# Patient Record
Sex: Female | Born: 1970
Health system: Southern US, Community
[De-identification: ages and names within clinical notes are randomized; demographics above are authoritative.]

## PROBLEM LIST (undated history)

## (undated) DIAGNOSIS — I82409 Acute embolism and thrombosis of unspecified deep veins of unspecified lower extremity: Secondary | ICD-10-CM

## (undated) DIAGNOSIS — F329 Major depressive disorder, single episode, unspecified: Secondary | ICD-10-CM

## (undated) DIAGNOSIS — F32A Depression, unspecified: Secondary | ICD-10-CM

## (undated) DIAGNOSIS — I2510 Atherosclerotic heart disease of native coronary artery without angina pectoris: Secondary | ICD-10-CM

## (undated) DIAGNOSIS — I509 Heart failure, unspecified: Secondary | ICD-10-CM

## (undated) DIAGNOSIS — J189 Pneumonia, unspecified organism: Secondary | ICD-10-CM

## (undated) DIAGNOSIS — I2119 ST elevation (STEMI) myocardial infarction involving other coronary artery of inferior wall: Secondary | ICD-10-CM

## (undated) DIAGNOSIS — K219 Gastro-esophageal reflux disease without esophagitis: Secondary | ICD-10-CM

## (undated) DIAGNOSIS — I214 Non-ST elevation (NSTEMI) myocardial infarction: Secondary | ICD-10-CM

## (undated) DIAGNOSIS — J45909 Unspecified asthma, uncomplicated: Secondary | ICD-10-CM

## (undated) DIAGNOSIS — G8929 Other chronic pain: Secondary | ICD-10-CM

## (undated) DIAGNOSIS — M1990 Unspecified osteoarthritis, unspecified site: Secondary | ICD-10-CM

## (undated) DIAGNOSIS — J449 Chronic obstructive pulmonary disease, unspecified: Secondary | ICD-10-CM

## (undated) DIAGNOSIS — I10 Essential (primary) hypertension: Secondary | ICD-10-CM

## (undated) DIAGNOSIS — E119 Type 2 diabetes mellitus without complications: Secondary | ICD-10-CM

## (undated) DIAGNOSIS — G43909 Migraine, unspecified, not intractable, without status migrainosus: Secondary | ICD-10-CM

## (undated) DIAGNOSIS — E7800 Pure hypercholesterolemia, unspecified: Secondary | ICD-10-CM

## (undated) DIAGNOSIS — M545 Low back pain: Secondary | ICD-10-CM

## (undated) DIAGNOSIS — F419 Anxiety disorder, unspecified: Secondary | ICD-10-CM

## (undated) HISTORY — PX: PERCUTANEOUS PINNING PHALANX FRACTURE OF HAND: SUR1027

## (undated) HISTORY — PX: CORONARY ANGIOPLASTY: SHX604

## (undated) HISTORY — PX: TONSILLECTOMY: SUR1361

## (undated) HISTORY — PX: CARDIAC CATHETERIZATION: SHX172

---

## 1998-03-07 ENCOUNTER — Encounter: Payer: Self-pay | Admitting: Obstetrics

## 1998-03-07 ENCOUNTER — Inpatient Hospital Stay (HOSPITAL_COMMUNITY): Admission: AD | Admit: 1998-03-07 | Discharge: 1998-03-07 | Payer: Self-pay | Admitting: Obstetrics

## 1998-03-12 ENCOUNTER — Encounter: Admission: RE | Admit: 1998-03-12 | Discharge: 1998-03-12 | Payer: Self-pay | Admitting: Family Medicine

## 1998-03-22 ENCOUNTER — Encounter: Admission: RE | Admit: 1998-03-22 | Discharge: 1998-03-22 | Payer: Self-pay | Admitting: Family Medicine

## 1998-03-29 ENCOUNTER — Encounter: Admission: RE | Admit: 1998-03-29 | Discharge: 1998-03-29 | Payer: Self-pay | Admitting: Family Medicine

## 1998-04-05 ENCOUNTER — Encounter: Admission: RE | Admit: 1998-04-05 | Discharge: 1998-04-05 | Payer: Self-pay | Admitting: Family Medicine

## 1998-04-13 ENCOUNTER — Encounter: Admission: RE | Admit: 1998-04-13 | Discharge: 1998-04-13 | Payer: Self-pay | Admitting: Family Medicine

## 1998-05-09 ENCOUNTER — Encounter: Admission: RE | Admit: 1998-05-09 | Discharge: 1998-05-09 | Payer: Self-pay | Admitting: Family Medicine

## 1999-02-04 ENCOUNTER — Encounter: Admission: RE | Admit: 1999-02-04 | Discharge: 1999-02-04 | Payer: Self-pay | Admitting: Family Medicine

## 1999-06-27 ENCOUNTER — Encounter: Payer: Self-pay | Admitting: Emergency Medicine

## 1999-06-27 ENCOUNTER — Emergency Department (HOSPITAL_COMMUNITY): Admission: EM | Admit: 1999-06-27 | Discharge: 1999-06-27 | Payer: Self-pay | Admitting: Emergency Medicine

## 1999-08-14 ENCOUNTER — Encounter: Admission: RE | Admit: 1999-08-14 | Discharge: 1999-08-14 | Payer: Self-pay | Admitting: Family Medicine

## 1999-12-27 ENCOUNTER — Emergency Department (HOSPITAL_COMMUNITY): Admission: EM | Admit: 1999-12-27 | Discharge: 1999-12-27 | Payer: Self-pay | Admitting: Emergency Medicine

## 2000-07-26 ENCOUNTER — Emergency Department (HOSPITAL_COMMUNITY): Admission: EM | Admit: 2000-07-26 | Discharge: 2000-07-26 | Payer: Self-pay | Admitting: Emergency Medicine

## 2000-07-26 ENCOUNTER — Encounter: Payer: Self-pay | Admitting: Emergency Medicine

## 2000-08-03 ENCOUNTER — Ambulatory Visit (HOSPITAL_BASED_OUTPATIENT_CLINIC_OR_DEPARTMENT_OTHER): Admission: RE | Admit: 2000-08-03 | Discharge: 2000-08-03 | Payer: Self-pay | Admitting: Orthopedic Surgery

## 2000-08-13 ENCOUNTER — Encounter: Admission: RE | Admit: 2000-08-13 | Discharge: 2000-08-13 | Payer: Self-pay | Admitting: Orthopedic Surgery

## 2000-09-28 ENCOUNTER — Encounter: Admission: RE | Admit: 2000-09-28 | Discharge: 2000-12-27 | Payer: Self-pay | Admitting: Orthopedic Surgery

## 2001-01-14 ENCOUNTER — Encounter: Admission: RE | Admit: 2001-01-14 | Discharge: 2001-01-14 | Payer: Self-pay | Admitting: Family Medicine

## 2001-02-03 ENCOUNTER — Encounter: Admission: RE | Admit: 2001-02-03 | Discharge: 2001-02-03 | Payer: Self-pay | Admitting: *Deleted

## 2001-02-22 ENCOUNTER — Ambulatory Visit (HOSPITAL_BASED_OUTPATIENT_CLINIC_OR_DEPARTMENT_OTHER): Admission: RE | Admit: 2001-02-22 | Discharge: 2001-02-22 | Payer: Self-pay | Admitting: *Deleted

## 2001-06-29 ENCOUNTER — Encounter: Admission: RE | Admit: 2001-06-29 | Discharge: 2001-06-29 | Payer: Self-pay | Admitting: Sports Medicine

## 2001-06-30 ENCOUNTER — Ambulatory Visit (HOSPITAL_COMMUNITY): Admission: RE | Admit: 2001-06-30 | Discharge: 2001-06-30 | Payer: Self-pay | Admitting: Family Medicine

## 2001-08-16 ENCOUNTER — Emergency Department (HOSPITAL_COMMUNITY): Admission: EM | Admit: 2001-08-16 | Discharge: 2001-08-16 | Payer: Self-pay | Admitting: Emergency Medicine

## 2001-08-16 ENCOUNTER — Encounter: Payer: Self-pay | Admitting: Emergency Medicine

## 2001-08-19 ENCOUNTER — Encounter: Admission: RE | Admit: 2001-08-19 | Discharge: 2001-08-19 | Payer: Self-pay | Admitting: Family Medicine

## 2001-08-20 ENCOUNTER — Encounter: Admission: RE | Admit: 2001-08-20 | Discharge: 2001-08-20 | Payer: Self-pay | Admitting: Family Medicine

## 2001-08-23 ENCOUNTER — Encounter: Admission: RE | Admit: 2001-08-23 | Discharge: 2001-08-23 | Payer: Self-pay | Admitting: Family Medicine

## 2001-08-25 ENCOUNTER — Encounter: Admission: RE | Admit: 2001-08-25 | Discharge: 2001-08-25 | Payer: Self-pay | Admitting: Family Medicine

## 2002-10-10 ENCOUNTER — Emergency Department (HOSPITAL_COMMUNITY): Admission: EM | Admit: 2002-10-10 | Discharge: 2002-10-10 | Payer: Self-pay | Admitting: Emergency Medicine

## 2002-10-12 ENCOUNTER — Emergency Department (HOSPITAL_COMMUNITY): Admission: EM | Admit: 2002-10-12 | Discharge: 2002-10-12 | Payer: Self-pay

## 2003-03-12 ENCOUNTER — Emergency Department (HOSPITAL_COMMUNITY): Admission: EM | Admit: 2003-03-12 | Discharge: 2003-03-13 | Payer: Self-pay | Admitting: Emergency Medicine

## 2003-07-07 ENCOUNTER — Inpatient Hospital Stay (HOSPITAL_COMMUNITY): Admission: AD | Admit: 2003-07-07 | Discharge: 2003-07-10 | Payer: Self-pay | Admitting: Psychiatry

## 2006-03-19 DIAGNOSIS — F17200 Nicotine dependence, unspecified, uncomplicated: Secondary | ICD-10-CM | POA: Insufficient documentation

## 2006-03-19 DIAGNOSIS — R209 Unspecified disturbances of skin sensation: Secondary | ICD-10-CM | POA: Insufficient documentation

## 2007-04-09 ENCOUNTER — Ambulatory Visit: Payer: Self-pay | Admitting: Family Medicine

## 2007-04-22 ENCOUNTER — Emergency Department: Payer: Self-pay | Admitting: Emergency Medicine

## 2008-11-09 ENCOUNTER — Emergency Department: Payer: Self-pay | Admitting: Unknown Physician Specialty

## 2009-03-28 ENCOUNTER — Emergency Department: Payer: Self-pay | Admitting: Unknown Physician Specialty

## 2009-11-19 ENCOUNTER — Emergency Department: Payer: Self-pay | Admitting: Emergency Medicine

## 2010-03-10 ENCOUNTER — Emergency Department: Payer: Self-pay | Admitting: Emergency Medicine

## 2011-02-14 ENCOUNTER — Emergency Department: Payer: Self-pay | Admitting: Internal Medicine

## 2011-02-24 ENCOUNTER — Emergency Department: Payer: Self-pay | Admitting: Emergency Medicine

## 2011-02-24 LAB — CBC
HCT: 43.5 % (ref 35.0–47.0)
HGB: 15.1 g/dL (ref 12.0–16.0)
MCH: 32.9 pg (ref 26.0–34.0)
Platelet: 224 10*3/uL (ref 150–440)
RBC: 4.58 10*6/uL (ref 3.80–5.20)
WBC: 10.8 10*3/uL (ref 3.6–11.0)

## 2011-02-24 LAB — COMPREHENSIVE METABOLIC PANEL
Albumin: 3.6 g/dL (ref 3.4–5.0)
Alkaline Phosphatase: 62 U/L (ref 50–136)
Anion Gap: 10 (ref 7–16)
BUN: 8 mg/dL (ref 7–18)
Bilirubin,Total: 0.2 mg/dL (ref 0.2–1.0)
Calcium, Total: 8.6 mg/dL (ref 8.5–10.1)
Chloride: 105 mmol/L (ref 98–107)
Co2: 23 mmol/L (ref 21–32)
Creatinine: 0.71 mg/dL (ref 0.60–1.30)
EGFR (African American): >60
EGFR (Non-African Amer.): >60
Osmolality: 281 (ref 275–301)
Potassium: 3.9 mmol/L (ref 3.5–5.1)
SGOT(AST): 19 U/L (ref 15–37)
SGPT (ALT): 34 U/L
Sodium: 138 mmol/L (ref 136–145)
Total Protein: 7.3 g/dL (ref 6.4–8.2)

## 2011-02-24 LAB — TROPONIN I: Troponin-I: <0.02 ng/mL

## 2011-06-02 ENCOUNTER — Observation Stay: Payer: Self-pay | Admitting: Internal Medicine

## 2011-06-02 LAB — URINALYSIS, COMPLETE
Bilirubin,UR: NEGATIVE
Glucose,UR: 50 mg/dL (ref 0–75)
Ketone: NEGATIVE
Nitrite: NEGATIVE
Ph: 5 (ref 4.5–8.0)
RBC,UR: 4 /HPF (ref 0–5)
Squamous Epithelial: 7
WBC UR: 1 /HPF (ref 0–5)

## 2011-06-02 LAB — ETHANOL: Ethanol: <3 mg/dL

## 2011-06-02 LAB — COMPREHENSIVE METABOLIC PANEL
Albumin: 3.7 g/dL (ref 3.4–5.0)
Alkaline Phosphatase: 75 U/L (ref 50–136)
Anion Gap: 7 (ref 7–16)
BUN: 9 mg/dL (ref 7–18)
Bilirubin,Total: 0.4 mg/dL (ref 0.2–1.0)
Calcium, Total: 8.4 mg/dL — ABNORMAL LOW (ref 8.5–10.1)
Chloride: 106 mmol/L (ref 98–107)
Co2: 24 mmol/L (ref 21–32)
Creatinine: 0.6 mg/dL (ref 0.60–1.30)
EGFR (African American): >60
EGFR (Non-African Amer.): >60
Osmolality: 273 (ref 275–301)
Potassium: 3.4 mmol/L — ABNORMAL LOW (ref 3.5–5.1)
SGOT(AST): 17 U/L (ref 15–37)
Sodium: 137 mmol/L (ref 136–145)
Total Protein: 7.2 g/dL (ref 6.4–8.2)

## 2011-06-02 LAB — CBC
HCT: 44.4 % (ref 35.0–47.0)
HGB: 14.5 g/dL (ref 12.0–16.0)
MCH: 30.9 pg (ref 26.0–34.0)
MCHC: 32.7 g/dL (ref 32.0–36.0)
MCV: 95 fL (ref 80–100)
Platelet: 223 10*3/uL (ref 150–440)
RBC: 4.7 10*6/uL (ref 3.80–5.20)
WBC: 10.7 10*3/uL (ref 3.6–11.0)

## 2011-06-02 LAB — APTT: Activated PTT: 28.5 secs (ref 23.6–35.9)

## 2011-06-02 LAB — DRUG SCREEN, URINE
Amphetamines, Ur Screen: NEGATIVE (ref ?–1000)
Benzodiazepine, Ur Scrn: POSITIVE (ref ?–200)
Cocaine Metabolite,Ur ~~LOC~~: NEGATIVE (ref ?–300)
MDMA (Ecstasy)Ur Screen: NEGATIVE (ref ?–500)
Methadone, Ur Screen: NEGATIVE (ref ?–300)
Opiate, Ur Screen: NEGATIVE (ref ?–300)
Tricyclic, Ur Screen: NEGATIVE (ref ?–1000)

## 2011-06-02 LAB — PROTIME-INR
INR: 1
Prothrombin Time: 13.1 secs (ref 11.5–14.7)

## 2011-06-02 LAB — PREGNANCY, URINE: Pregnancy Test, Urine: NEGATIVE m[IU]/mL

## 2011-06-02 LAB — CK TOTAL AND CKMB (NOT AT ARMC)
CK, Total: 52 U/L (ref 21–215)
CK-MB: <0.5 ng/mL — ABNORMAL LOW (ref 0.5–3.6)

## 2011-06-03 DIAGNOSIS — G459 Transient cerebral ischemic attack, unspecified: Secondary | ICD-10-CM

## 2011-06-03 LAB — LIPID PANEL
Cholesterol: 165 mg/dL (ref 0–200)
HDL Cholesterol: 19 mg/dL — ABNORMAL LOW (ref 40–60)
Triglycerides: 443 mg/dL — ABNORMAL HIGH (ref 0–200)

## 2011-06-03 LAB — CK-MB: CK-MB: <0.5 ng/mL — ABNORMAL LOW (ref 0.5–3.6)

## 2011-12-12 ENCOUNTER — Emergency Department: Payer: Self-pay | Admitting: Emergency Medicine

## 2012-05-16 ENCOUNTER — Emergency Department: Payer: Self-pay | Admitting: Emergency Medicine

## 2012-05-16 LAB — CBC
HGB: 14.1 g/dL (ref 12.0–16.0)
MCH: 31.6 pg (ref 26.0–34.0)
MCHC: 33.4 g/dL (ref 32.0–36.0)
MCV: 95 fL (ref 80–100)
Platelet: 224 10*3/uL (ref 150–440)
RBC: 4.47 10*6/uL (ref 3.80–5.20)
RDW: 13 % (ref 11.5–14.5)
WBC: 9.5 10*3/uL (ref 3.6–11.0)

## 2012-05-16 LAB — URINALYSIS, COMPLETE
Bilirubin,UR: NEGATIVE
Glucose,UR: 50 mg/dL (ref 0–75)
Ketone: NEGATIVE
Leukocyte Esterase: NEGATIVE
Nitrite: NEGATIVE
Protein: NEGATIVE
RBC,UR: <1 /HPF (ref 0–5)

## 2012-05-16 LAB — COMPREHENSIVE METABOLIC PANEL
Albumin: 3.4 g/dL (ref 3.4–5.0)
Anion Gap: 8 (ref 7–16)
BUN: 6 mg/dL — ABNORMAL LOW (ref 7–18)
Chloride: 108 mmol/L — ABNORMAL HIGH (ref 98–107)
Creatinine: 0.68 mg/dL (ref 0.60–1.30)
EGFR (African American): >60
EGFR (Non-African Amer.): >60
Glucose: 207 mg/dL — ABNORMAL HIGH (ref 65–99)
Potassium: 3.7 mmol/L (ref 3.5–5.1)
SGOT(AST): 15 U/L (ref 15–37)
SGPT (ALT): 28 U/L (ref 12–78)

## 2012-12-06 ENCOUNTER — Emergency Department: Payer: Self-pay | Admitting: Emergency Medicine

## 2013-05-17 ENCOUNTER — Emergency Department: Payer: Self-pay | Admitting: Emergency Medicine

## 2013-08-20 DIAGNOSIS — I214 Non-ST elevation (NSTEMI) myocardial infarction: Secondary | ICD-10-CM

## 2013-08-20 HISTORY — DX: Non-ST elevation (NSTEMI) myocardial infarction: I21.4

## 2013-09-17 ENCOUNTER — Inpatient Hospital Stay: Payer: Self-pay | Admitting: Internal Medicine

## 2013-09-17 LAB — COMPREHENSIVE METABOLIC PANEL
ALK PHOS: 74 U/L
ANION GAP: 9 (ref 7–16)
AST: 18 U/L (ref 15–37)
Albumin: 3.4 g/dL (ref 3.4–5.0)
BUN: 9 mg/dL (ref 7–18)
Bilirubin,Total: 0.2 mg/dL (ref 0.2–1.0)
CALCIUM: 8.6 mg/dL (ref 8.5–10.1)
Chloride: 105 mmol/L (ref 98–107)
Co2: 24 mmol/L (ref 21–32)
Creatinine: 0.74 mg/dL (ref 0.60–1.30)
EGFR (African American): >60
EGFR (Non-African Amer.): >60
Glucose: 162 mg/dL — ABNORMAL HIGH (ref 65–99)
OSMOLALITY: 278 (ref 275–301)
Potassium: 3.5 mmol/L (ref 3.5–5.1)
SGPT (ALT): 31 U/L
Sodium: 138 mmol/L (ref 136–145)
TOTAL PROTEIN: 7 g/dL (ref 6.4–8.2)

## 2013-09-17 LAB — APTT: ACTIVATED PTT: 27.9 s (ref 23.6–35.9)

## 2013-09-17 LAB — CBC
HCT: 44.2 % (ref 35.0–47.0)
HGB: 15 g/dL (ref 12.0–16.0)
MCH: 33.2 pg (ref 26.0–34.0)
MCHC: 33.9 g/dL (ref 32.0–36.0)
MCV: 98 fL (ref 80–100)
Platelet: 195 10*3/uL (ref 150–440)
RBC: 4.52 10*6/uL (ref 3.80–5.20)
RDW: 13.6 % (ref 11.5–14.5)
WBC: 12.2 10*3/uL — AB (ref 3.6–11.0)

## 2013-09-17 LAB — CK-MB
CK-MB: 2.1 ng/mL (ref 0.5–3.6)
CK-MB: 10.5 ng/mL — ABNORMAL HIGH (ref 0.5–3.6)

## 2013-09-17 LAB — PROTIME-INR
INR: 1
Prothrombin Time: 13 secs (ref 11.5–14.7)

## 2013-09-17 LAB — HEMOGLOBIN A1C: Hemoglobin A1C: 8 % — ABNORMAL HIGH (ref 4.2–6.3)

## 2013-09-17 LAB — TROPONIN I
TROPONIN-I: 0.28 ng/mL — AB
Troponin-I: 0.95 ng/mL — ABNORMAL HIGH

## 2013-09-17 LAB — LIPASE, BLOOD: Lipase: 75 U/L (ref 73–393)

## 2013-09-18 DIAGNOSIS — I219 Acute myocardial infarction, unspecified: Secondary | ICD-10-CM

## 2013-09-18 LAB — BASIC METABOLIC PANEL
Anion Gap: 10 (ref 7–16)
BUN: 8 mg/dL (ref 7–18)
CO2: 21 mmol/L (ref 21–32)
Calcium, Total: 8 mg/dL — ABNORMAL LOW (ref 8.5–10.1)
Chloride: 105 mmol/L (ref 98–107)
Creatinine: 0.66 mg/dL (ref 0.60–1.30)
EGFR (Non-African Amer.): >60
GLUCOSE: 207 mg/dL — AB (ref 65–99)
Osmolality: 276 (ref 275–301)
Potassium: 3.8 mmol/L (ref 3.5–5.1)
Sodium: 136 mmol/L (ref 136–145)

## 2013-09-18 LAB — LIPID PANEL
Cholesterol: 132 mg/dL (ref 0–200)
Cholesterol: 137 mg/dL (ref 0–200)
HDL Cholesterol: 23 mg/dL — ABNORMAL LOW (ref 40–60)
HDL: 24 mg/dL — AB (ref 40–60)
Ldl Cholesterol, Calc: 44 mg/dL (ref 0–100)
Ldl Cholesterol, Calc: 49 mg/dL (ref 0–100)
TRIGLYCERIDES: 326 mg/dL — AB (ref 0–200)
Triglycerides: 322 mg/dL — ABNORMAL HIGH (ref 0–200)
VLDL CHOLESTEROL, CALC: 64 mg/dL — AB (ref 5–40)
VLDL Cholesterol, Calc: 65 mg/dL — ABNORMAL HIGH (ref 5–40)

## 2013-09-18 LAB — CBC WITH DIFFERENTIAL/PLATELET
Basophil #: 0.1 10*3/uL (ref 0.0–0.1)
Basophil %: 0.6 %
Eosinophil #: 0.1 10*3/uL (ref 0.0–0.7)
Eosinophil %: 0.9 %
HCT: 40.6 % (ref 35.0–47.0)
HGB: 13.4 g/dL (ref 12.0–16.0)
LYMPHS PCT: 21.9 %
Lymphocyte #: 3.2 10*3/uL (ref 1.0–3.6)
MCH: 32.7 pg (ref 26.0–34.0)
MCHC: 33 g/dL (ref 32.0–36.0)
MCV: 99 fL (ref 80–100)
MONOS PCT: 4.8 %
Monocyte #: 0.7 x10 3/mm (ref 0.2–0.9)
NEUTROS ABS: 10.6 10*3/uL — AB (ref 1.4–6.5)
Neutrophil %: 71.8 %
Platelet: 185 10*3/uL (ref 150–440)
RBC: 4.1 10*6/uL (ref 3.80–5.20)
RDW: 13.6 % (ref 11.5–14.5)
WBC: 14.7 10*3/uL — ABNORMAL HIGH (ref 3.6–11.0)

## 2013-09-18 LAB — HEPARIN LEVEL (UNFRACTIONATED)
ANTI-XA(UNFRACTIONATED): 0.26 [IU]/mL — AB (ref 0.30–0.70)
Anti-Xa(Unfractionated): <0.1 IU/mL — ABNORMAL LOW (ref 0.30–0.70)
Anti-Xa(Unfractionated): <0.1 IU/mL — ABNORMAL LOW (ref 0.30–0.70)

## 2013-09-18 LAB — CK-MB: CK-MB: 35.8 ng/mL — ABNORMAL HIGH (ref 0.5–3.6)

## 2013-09-18 LAB — TROPONIN I: TROPONIN-I: 4.1 ng/mL — AB

## 2013-09-18 LAB — HEMOGLOBIN A1C: Hemoglobin A1C: 6.6 % — ABNORMAL HIGH (ref 4.2–6.3)

## 2013-09-19 LAB — HEPARIN LEVEL (UNFRACTIONATED): Anti-Xa(Unfractionated): 0.27 IU/mL — ABNORMAL LOW (ref 0.30–0.70)

## 2013-10-05 ENCOUNTER — Emergency Department: Payer: Self-pay | Admitting: Emergency Medicine

## 2013-10-05 LAB — BASIC METABOLIC PANEL
Anion Gap: 8 (ref 7–16)
BUN: 11 mg/dL (ref 7–18)
Calcium, Total: 9.1 mg/dL (ref 8.5–10.1)
Chloride: 108 mmol/L — ABNORMAL HIGH (ref 98–107)
Co2: 22 mmol/L (ref 21–32)
Creatinine: 0.75 mg/dL (ref 0.60–1.30)
Glucose: 176 mg/dL — ABNORMAL HIGH (ref 65–99)
Osmolality: 279 (ref 275–301)
Potassium: 3.9 mmol/L (ref 3.5–5.1)
SODIUM: 138 mmol/L (ref 136–145)

## 2013-10-05 LAB — CBC
HCT: 43.6 % (ref 35.0–47.0)
HGB: 14.8 g/dL (ref 12.0–16.0)
MCH: 32.9 pg (ref 26.0–34.0)
MCHC: 33.9 g/dL (ref 32.0–36.0)
MCV: 97 fL (ref 80–100)
Platelet: 260 10*3/uL (ref 150–440)
RBC: 4.48 10*6/uL (ref 3.80–5.20)
RDW: 13.4 % (ref 11.5–14.5)
WBC: 9.8 10*3/uL (ref 3.6–11.0)

## 2013-10-05 LAB — TROPONIN I: Troponin-I: <0.02 ng/mL

## 2013-10-17 ENCOUNTER — Ambulatory Visit: Payer: Self-pay | Admitting: Cardiovascular Disease

## 2014-02-07 ENCOUNTER — Observation Stay: Payer: Self-pay | Admitting: Specialist

## 2014-02-07 LAB — BASIC METABOLIC PANEL
ANION GAP: 8 (ref 7–16)
BUN: 6 mg/dL — ABNORMAL LOW (ref 7–18)
CHLORIDE: 106 mmol/L (ref 98–107)
CREATININE: 0.68 mg/dL (ref 0.60–1.30)
Calcium, Total: 8.5 mg/dL (ref 8.5–10.1)
Co2: 24 mmol/L (ref 21–32)
EGFR (Non-African Amer.): >60
Glucose: 100 mg/dL — ABNORMAL HIGH (ref 65–99)
Osmolality: 273 (ref 275–301)
Potassium: 3.8 mmol/L (ref 3.5–5.1)
Sodium: 138 mmol/L (ref 136–145)

## 2014-02-07 LAB — CBC
HCT: 42.3 % (ref 35.0–47.0)
HGB: 13.9 g/dL (ref 12.0–16.0)
MCH: 32 pg (ref 26.0–34.0)
MCHC: 32.9 g/dL (ref 32.0–36.0)
MCV: 97 fL (ref 80–100)
Platelet: 216 10*3/uL (ref 150–440)
RBC: 4.36 10*6/uL (ref 3.80–5.20)
RDW: 13.6 % (ref 11.5–14.5)
WBC: 10.4 10*3/uL (ref 3.6–11.0)

## 2014-02-07 LAB — LIPID PANEL
Cholesterol: 98 mg/dL (ref 0–200)
HDL Cholesterol: 28 mg/dL — ABNORMAL LOW (ref 40–60)
Ldl Cholesterol, Calc: 48 mg/dL (ref 0–100)
Triglycerides: 109 mg/dL (ref 0–200)
VLDL CHOLESTEROL, CALC: 22 mg/dL (ref 5–40)

## 2014-02-07 LAB — HEPATIC FUNCTION PANEL A (ARMC)
AST: 28 U/L (ref 15–37)
Albumin: 3.6 g/dL (ref 3.4–5.0)
Alkaline Phosphatase: 94 U/L
Bilirubin,Total: 0.5 mg/dL (ref 0.2–1.0)
SGPT (ALT): 32 U/L
Total Protein: 7.1 g/dL (ref 6.4–8.2)

## 2014-02-07 LAB — TROPONIN I
Troponin-I: <0.02 ng/mL
Troponin-I: <0.02 ng/mL

## 2014-02-07 LAB — PRO B NATRIURETIC PEPTIDE: B-Type Natriuretic Peptide: 146 pg/mL — ABNORMAL HIGH (ref 0–125)

## 2014-02-07 LAB — CK TOTAL AND CKMB (NOT AT ARMC)
CK, Total: 68 U/L (ref 26–192)
CK-MB: 0.5 ng/mL (ref 0.5–3.6)

## 2014-02-07 LAB — PROTIME-INR
INR: 1
PROTHROMBIN TIME: 13.5 s (ref 11.5–14.7)

## 2014-02-07 LAB — LIPASE, BLOOD: Lipase: 72 U/L — ABNORMAL LOW (ref 73–393)

## 2014-02-08 LAB — CBC WITH DIFFERENTIAL/PLATELET
BASOS PCT: 0.6 %
Basophil #: 0 10*3/uL (ref 0.0–0.1)
EOS ABS: 0.3 10*3/uL (ref 0.0–0.7)
Eosinophil %: 3.6 %
HCT: 39.3 % (ref 35.0–47.0)
HGB: 13.1 g/dL (ref 12.0–16.0)
LYMPHS PCT: 34.7 %
Lymphocyte #: 2.6 10*3/uL (ref 1.0–3.6)
MCH: 32.5 pg (ref 26.0–34.0)
MCHC: 33.4 g/dL (ref 32.0–36.0)
MCV: 97 fL (ref 80–100)
Monocyte #: 0.6 x10 3/mm (ref 0.2–0.9)
Monocyte %: 8.1 %
NEUTROS PCT: 53 %
Neutrophil #: 4 10*3/uL (ref 1.4–6.5)
PLATELETS: 180 10*3/uL (ref 150–440)
RBC: 4.04 10*6/uL (ref 3.80–5.20)
RDW: 13.7 % (ref 11.5–14.5)
WBC: 7.5 10*3/uL (ref 3.6–11.0)

## 2014-02-08 LAB — BASIC METABOLIC PANEL
Anion Gap: 7 (ref 7–16)
BUN: 16 mg/dL (ref 7–18)
CHLORIDE: 108 mmol/L — AB (ref 98–107)
Calcium, Total: 8.6 mg/dL (ref 8.5–10.1)
Co2: 25 mmol/L (ref 21–32)
Creatinine: 0.79 mg/dL (ref 0.60–1.30)
EGFR (African American): >60
EGFR (Non-African Amer.): >60
Glucose: 96 mg/dL (ref 65–99)
OSMOLALITY: 280 (ref 275–301)
POTASSIUM: 3.8 mmol/L (ref 3.5–5.1)
SODIUM: 140 mmol/L (ref 136–145)

## 2014-04-14 ENCOUNTER — Ambulatory Visit: Payer: Self-pay | Admitting: Internal Medicine

## 2014-05-13 NOTE — Consult Note (Signed)
PATIENT NAME:  Krista Alvarez, Krista Alvarez MR#:  [id number] DATE OF BIRTH:  22-Nov-1970  DATE OF CONSULTATION:  09/18/2013  CONSULTING PHYSICIAN:  Laurier Nancy, MD  INDICATION FOR CONSULTATION: Chest pain, elevated troponin, non- ST segment elevation myocardial infarction.   HISTORY OF PRESENT ILLNESS: This is a 44 year old white female with a past medical history of diabetes, 1.5 packs per day smoking, hypertension, hyperlipidemia, strong family history of coronary artery disease, presented to the Emergency Room yesterday with 5/10 chest pain. She says she has been having chest pain since yesterday, on a scale of 1-10 between 5 and 7, described as pressure-type associated with shortness of breath and diaphoresis. She also had some nausea and vomiting with the episodes of chest pain. Her initial troponin was only 0.28, and then 0.95 and was admitted to telemetry. This morning I was called to evaluate the patient but before I got to see the patient the patient was transferred from telemetry to ICU because of a severe episode of chest pain. She is still having intermittent chest pain. EKG still shows no evidence of STEMI with no significant ST changes.   PAST MEDICAL HISTORY: History of diabetes, history of tobacco use, history of hypertension, hyperlipidemia.   SOCIAL HISTORY: As mentioned. No history of EtOH abuse, 1-1/2 packs per day smoking.   FAMILY HISTORY: Her father had stroke and other family member had coronary artery disease.   PHYSICAL EXAMINATION:  GENERAL: She is alert, oriented x 3, in mild distress right now due to chest pain.  VITALS: Blood pressure is 135/88, respirations 15, pulse 56, temperature 97.6, saturation is 95%.  HEENT: No JVD.  LUNGS: Clear.  HEART: Regular rate and rhythm. Normal S1, S2. No audible murmur.  ABDOMEN: Soft, nontender, positive bowel sounds.  EXTREMITIES: No pedal edema.  NEUROLOGIC: She appears to be intact.   STUDIES: EKG shows normal sinus rhythm, 72 beats  per minute, nonspecific ST-T changes.   LABORATORY DATA: BUN and creatinine are 9 and 0.74, glucose is 162. GFR is greater than 60. Hemoglobin A1c is 8, her initial troponin was 0.95; followup troponin was 4.1. Cholesterol was 132, triglycerides 326. LDL was 44, HDL was 23.   ASSESSMENT AND PLAN: The patient is definitely having a non-ST segment elevation myocardial infarction. There is no ST elevation on repeated EKGs. She continues to have chest pain. She has all the risk factors for coronary artery disease including smoking, family history, hypertension, hyperlipidemia and diabetes. Advise not giving Plavix in case she needs coronary artery bypass graft. We will start the patient on Aggrastat IV, discontinue nitroglycerin ointment. Continue aspirin, beta blockers. We will get an echocardiogram to further evaluate wall motion and ejection fraction. Also, we will schedule the patient for cardiac catheterization first thing in the morning. Hopefully with Aggrastat her pain should improve.    ____________________________ Laurier Nancy, MD sak:lt D: 09/18/2013 10:43:30 ET T: 09/18/2013 11:47:56 ET JOB#: [id number]  cc: Laurier Nancy, MD, <Dictator> Laurier Nancy MD ELECTRONICALLY SIGNED 10/06/2013 13:30
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
## 2014-05-13 NOTE — Consult Note (Signed)
Chest pain free now, echo was unremarkable, and f/u ekg ok. Will cath am.  Electronic Signatures: Radene Knee (MD)  (Signed on 30-Aug-15 17:16)  Authored  Last Updated: 30-Aug-15 17:16 by Radene Knee (MD)

## 2014-05-13 NOTE — Discharge Summary (Signed)
PATIENT NAME:  Krista Alvarez, Krista Alvarez MR#:  [id number] DATE OF BIRTH:  [birth year]  DATE OF ADMISSION:  09/17/2013  DATE OF DISCHARGE:  09/20/2013  DISCHARGE DIAGNOSES:  1.  Chest pain secondary to non-ST elevation myocardial infarction.  2.  Coronary artery disease.  3.  Hypertension.  4.  Type 2 diabetes mellitus, noncompliant with medication.  5.  Chronic obstructive pulmonary disease exacerbation.  6.  Tobacco abuse.   DISCHARGE MEDICATIONS: 1.  Imdur 30 mg p.o. daily.  2.  Atorvastatin 80 mg p.o. daily. 3.  Aspirin 81 mg p.o. daily.  4.  Azithromycin 5 mg daily for 3 days.  5.  Spiriva 18 mcg inhalation daily.  6.  Metoprolol tartrate 25 mg twice daily.  7.  Plavix 75 mg daily.  8.  Prednisone 10 mg 3 tablets daily for 3 days, 2 tablets daily for 2 days, 1 tablet daily for 2 days and then stop.  9.  Augmentin 1 tablet p.o. b.i.d. for 10 days.  10.  Combivent Respimat 100/20 one puff 4 times daily.  11.  Glucophage 500 mg p.o. b.i.d. The patient is advised to take Glucophage from Thursday, September 3rd.  The patient is not supposed to start it before because of the contrast that she received for cardiac catheterization.   CONSULTATIONS: Cardiology consult with Dr. Adrian Blackwater.   PROCEDURES: Cardiac catheterization.   HOSPITAL COURSE:  1.  A 44 year old female patient with a history of diabetes mellitus type 2, tobacco abuse. The patient was not taking medicines for diabetes, came in because of chest pain. The patient had midsternal chest discomfort and nausea and diaphoresis. Initial troponins were 0.28. She was admitted to medicine on telemetry, started on aspirin, beta-blockers and heparin drip and nitrates as well. The patient seen by me next day; the patient continued to have chest pain. The patient's troponin went up to 4 and EKG did not show any acute ST-T changes. The patient moved to ICU for nitro drip and we continued heparin drip. The patient was continued on aspirin, small  dose beta-blockers and also statins and nitrates.  She was also given Plavix in addition to her aspirin and heparin because of her MI. The patient was seen by Dr. Adrian Blackwater in consultation and she was taken to cardiac catheterization yesterday, and the patient's cardiac catheterization showed ejection fraction 60%, but she has diffuse disease. She has 60% lesion in ostial medial and also 60%  lesion  in LAD and RCA. She has 3-vessel disease.  Dr. Welton Flakes said she needs aggressive medical management and risk-factor modification, and he said he is going to see her in follow-up as an outpatient in a week and try to encourage the change in medications and diet changes. The patient, if she does not follow that, she will probably need a bypass surgery; same is explained to the patient and we discharged her home with aspirin, beta-blockers, statins and Plavix and nitrates. The patient will see Dr. Welton Flakes this Friday.  2.  Diabetes mellitus type 2. The patient is not taking medicines at home, and the patient's hemoglobin A1c is 8.  She was given prescription for metformin and she can start it from, Thursday, and she needs to see a primary doctor and the patient needs to follow up with cardiology as well.  3.  COPD exacerbation. The patient was wheezing and has cough.  White count was 12 on admission. The patient's chest x-ray did not show any pneumonia. I started her on steroids, nebulizers,  antibiotics because of COPD exacerbation. The patient's x-ray showed only some atelectasis.  She nicely improved. Her lungs sound clear today, and discharged her home with a tapering course of prednisone, antibiotics inhalers, and Spiriva.   4.  Tobacco abuse. The patient smokes 2 packs of cigarettes a day.  I advised her to quit smoking slowly. use that money to purchase her medications. The patient understands that and said that she will try to quit, but she needs help and she will probably be on Wellbutrin after she sees Dr. Adrian Blackwater.   DISCHARGE VITAL SIGNS: Temperature is 98.1, heart rate 73, blood pressure 144/82, sats 97% on room air.   TIME SPENT ON DISCHARGE PREPARATION:  More than 30 minutes.    ____________________________ Katha Hamming, MD sk:lr D: 09/20/2013 17:25:09 ET T: 09/20/2013 18:25:19 ET JOB#: [id number]  cc: Katha Hamming, MD, <Dictator> Laurier Nancy, MD Katha Hamming MD ELECTRONICALLY SIGNED 09/28/2013 16:12

## 2014-05-14 NOTE — H&P (Signed)
PATIENT NAME:  Krista Alvarez, Krista Alvarez MR#:  [id number] DATE OF BIRTH:  1970-02-11  DATE OF ADMISSION:  06/02/2011  PRIMARY CARE PHYSICIAN: [facility]    CHIEF COMPLAINT: Numbness of the right arm and leg and chest pain.   HISTORY OF PRESENT ILLNESS: This is a 44 year old female with history of diabetes who presents with the above complaint. Since about 4 o'clock this afternoon the patient has had substernal chest pain that has lasted until now. On a scale of 1 to 10 it is about a 4 out of 10. She also has associated headache. She is also complaining of another symptom including right arm numbness and right leg numbness as well as tingling and weakness. No other neurological symptoms. She does state, however, that she thinks that her mind feels groggy. She is also complaining of a headache.    REVIEW OF SYSTEMS: CONSTITUTIONAL: No fever or fatigue. Positive weakness. EYES: No blurred or double vision, glaucoma, or cataracts. ENT: No ear pain, hearing loss, seasonal allergies, postnasal drip. RESPIRATORY: No cough, wheezing, hemoptysis. CARDIOVASCULAR: Chest pain as mentioned above. No palpitations, orthopnea, syncope, edema, arrhythmia, dyspnea on exertion. GI: No nausea, vomiting, diarrhea, abdominal pain, melena, or ulcers. GU: No dysuria or hematuria. ENDOCRINE: No polyuria or polydipsia. HEME/LYMPH: No anemia or easy bruising. SKIN: No rash or lesions. MUSCULOSKELETAL: No arthritis or gout. NEUROLOGIC: No history of CVA or TIA. PSYCH: No history of anxiety or depression.   PAST MEDICAL HISTORY: Diabetes.   MEDICATIONS:  1. Metformin 500 mg daily.  2. Glipizide 5 mg daily.   ALLERGIES: No known drug allergies.   PAST SURGICAL HISTORY: Tonsillectomy.   SOCIAL HISTORY: The patient smokes 1 pack a day. Occasional alcohol use.   FAMILY HISTORY: Positive for lung cancer and CVA.   PHYSICAL EXAMINATION:   VITAL SIGNS: The patient is afebrile with temperature of 97.3, pulse 75, respirations  18, blood pressure 125/62, 97% on room air.   GENERAL: The patient is alert, oriented, not in acute distress.   HEENT: Head is atraumatic. Pupils are round and reactive. Sclerae anicteric. Mucous membranes are moist. Oropharynx is clear.   NECK: Supple without JVD, carotid bruit, enlarged thyroid.   CARDIOVASCULAR: Regular rate and rhythm. No murmurs, gallops, or rubs. PMI is not displaced.   LUNGS: Clear to auscultation bilaterally without crackles, rales, rhonchi, or wheezing.   ABDOMEN: Bowel sounds are positive. Nontender, nondistended. No hepatosplenomegaly.   EXTREMITIES: No clubbing, cyanosis, or edema.   NEUROLOGIC: Cranial nerves II through XII are intact. There are no focal deficits. Finger-to-nose intact. Heel-to-shin is intact. Strength is 4/5 bilaterally and symmetrically. Reflexes are 2+.   SKIN: No rashes.   LABORATORY, DIAGNOSTIC, AND RADIOLOGICAL DATA: Urinalysis shows no LCE or nitrites. Urine toxicology positive for benzodiazepines. Urine pregnancy test is negative. White blood cells 10.7, hemoglobin 14.5, hematocrit 45, platelets 223, sodium 137, potassium 3.4, chloride 106, bicarb 24, BUN 9, creatinine 0.60, glucose 113, calcium 8.4, bilirubin 0.4, alkaline phosphatase 75, ALT 34, AST 17, total protein 7.2, albumin 3.7. Troponin less than 0.02. CK 52. CPK-MB less than 0.5. INR is 1.0. Alcohol level is less than 3.   Chest x-ray shows no acute cardiopulmonary disease.   CT of the head shows no acute intracranial hemorrhage or CVA.   EKG is normal sinus rhythm. No ST elevations or depressions.   ASSESSMENT AND PLAN: This is a 44 year old female with a history of diabetes who presents with right arm and leg weakness and numbness and chest pain.  1. Numbness of the right arm and leg. The patient's symptoms are not totally consistent with a CVA, however, I will order an MRI. If this is positive, then we will order an echo and carotid Doppler's. We will start aspirin and  check fasting lipids.  2. Chest pain, sounds atypical in nature for cardiac, however, the patient is a diabetic. We will place the patient on telemetry. Will rule out with cardiac enzymes. The patient would benefit from an outpatient stress test if cardiac enzymes are negative.  3. Diabetes. Continue glipizide. Hold metformin for now. Sliding scale insulin. ADA diet.  4. Hypokalemia. Will replete and we can repeat a BMP in the a.m.  5. Smoking dependence. I encouraged the patient to stop smoking. The patient was counseled for three minutes. She does want a nicotine patch. At this point she is not interested in quitting.   TIME SPENT: Approximately 55 minutes.   ____________________________ Janyth Contes. Juliene Pina, MD spm:drc D: 06/02/2011 21:49:37 ET T: 06/03/2011 07:25:40 ET JOB#: [id number]  cc: Rebecah Dangerfield P. Juliene Pina, MD, <Dictator> [facility]  Rickey Farrier P James Senn MD ELECTRONICALLY SIGNED 06/03/2011 19:46

## 2014-05-14 NOTE — Discharge Summary (Signed)
PATIENT NAME:  Krista Alvarez, Krista Alvarez MR#:  [id number] DATE OF BIRTH:  05-20-1970  DATE OF ADMISSION:  06/02/2011 DATE OF DISCHARGE:  06/03/2011  ADMITTING PHYSICIAN: Adrian Saran, MD  DISCHARGING PHYSICIAN: Enid Baas, MD  PRIMARY CARE PHYSICIAN: None.  CONSULTANTS: None.  DISCHARGE DIAGNOSES:  1. Cervical radiculopathy with right arm numbness and pain radiating to right arm with numbness, between shoulder blades, and also to the chest.  2. Peripheral neuropathy.  3. Uncontrolled diabetes mellitus with Hemoglobin A1c of 7.6.  4. Hypertension.  5. Tobacco use disorder.  6. Hyperlipidemia.  7. Stress, anxiety and depression.   DISCHARGE HOME MEDICATIONS:  1. Metformin 500 mg p.o. twice a day. 2. Lisinopril 5 mg p.o. daily. 3. Celexa 20 mg p.o. daily.  4. Neurontin 100 mg p.o. twice a day. 5. Skelaxin 400 mg p.o. every 8 hours p.r.n.  6. Norco 5/325 mg one tablet p.o. every six hours p.r.n.  7. Pravachol 40 mg p.o. daily.  8. Fish oil 1 gram capsule p.o. daily.  9. Aspirin 81 mg p.o. daily.  DISCHARGE DIET: Low sodium, ADA diet.   DISCHARGE ACTIVITY: As tolerated.   DISCHARGE INSTRUCTIONS: Followup with PCP in one week. Outpatient physical therapy for cervical radiculopathy. Smoking cessation advised. Orthopedic appointment is scheduled as an outpatient for cervical radiculopathy with Dr. Zachery Dauer at [facility] for 06/17/2011 at 9:00 a.m.   LABS/IMAGING STUDIES: WBC 10.7, hemoglobin 14.5, hematocrit 44.4, and platelet count 223. Sodium 137, potassium 3.4, chloride 106, bicarbonate 24, BUN 9, creatinine 0.6, and glucose 113, and calcium 8.4.   Urinalysis: 2+ blood, no RBCs, and no evidence of infection.   Urine drug screen positive for benzodiazepines. Pregnancy test is negative. Cardiac enzymes: Troponin remained negative x3 while in the hospital. HbA1c 7.6.   LDL cholesterol could not be calculated because triglycerides are elevated at 433, total cholesterol  165, and HDL low at 19.   MRI of the brain without contrast showed no acute intracranial findings. No acute infarct.  MRI of the C-spine without contrast showed mild posterior disk bulge at C5-C6 level.   2-D echocardiogram showed normal study. No thrombus, mass, or vegetation noted. Ejection fraction is greater than 55%. No wall motion abnormalities noted.  CT of the head without contrast showed no acute intracranial process.  Chest x-ray showed no acute disease of the chest.  BRIEF HOSPITAL COURSE: Krista Alvarez is a 44 year old [place] female with past medical history significant for diabetes and hypertension who presented to the hospital complaining of substernal chest pain, neck pain, headache and also right arm numbness.  1. Multiple neurological complaints, especially with right arm numbness, neck pain, and also headache. It sounded more like cervical radiculopathy symptoms. The patient also is under a lot of stress and also anxiety secondary to personal issues and also at work. She was started on Skelaxin p.r.n. muscle relaxant and Norco p.r.n. for pain. She did have a MRI of the brain which is negative for any infarct and had a MRI of C-spine which did show some C-spine arthritis with mild posterior disk bulge at C5-C6 level. She was recommended outpatient physical therapy for her radiculopathy symptoms and also orthopedic follow-up appointment was scheduled with [facility], Dr. Zachery Dauer.  2. Anxiety and depression and increased stress at work. She was also started on Celexa for the same. She was also complaining of leg numbness, but it is in bilateral feet probably secondary to her diabetes. She has neuropathy and also retinopathy symptoms. She was started  on low dose Gabapentin for the same.  3. Hyperlipidemia. She was started on Pravachol and also fish oil, especially for the hypertriglyceridemia. 4. Hypertension. She can continue her lisinopril.  5. Diabetes mellitus.  Metformin was increased to 500 mg p.o. twice a day and dietary regulations have been told. Her course has been otherwise uneventful in the hospital.   DISCHARGE CONDITION: Stable.   DISCHARGE DISPOSITION: Home.   TIME SPENT ON DISCHARGE: 40 minutes. ____________________________ Enid Baas, MD rk:slb D: 06/04/2011 13:16:22 ET T: 06/04/2011 13:42:29 ET JOB#: [id number]  cc: Enid Baas, MD, <Dictator> Enid Baas MD ELECTRONICALLY SIGNED 06/04/2011 14:22

## 2014-05-21 NOTE — Consult Note (Signed)
PATIENT NAME:  Krista Alvarez, Krista Alvarez MR#:  [id number] DATE OF BIRTH:  06/18/1970  DATE OF CONSULTATION:  02/08/2014  REFERRING PHYSICIAN:   CONSULTING PHYSICIAN:  Alinda Sierras. Jarold Motto, PA-C  CARDIOLOGY CONSULTATION   DICTATED FOR: Laurier Nancy, MD  INDICATION FOR CONSULT: Chest pain, coronary artery disease.   HISTORY OF PRESENT ILLNESS: This is a 44 year old Caucasian female, well known to our practice with a past medical history of coronary artery disease, hypertension, and diabetes, who presented to the ER yesterday complaining of severe chest pain. She states chest pain started while she was working, 10 out of 10 in severity, substernal in location, squeezing in nature with radiation to the left shoulder and back. She complains of concurrent shortness of breath and blurred vision. She states 2 doses of sublingual nitroglycerin did not relieve pain, thus her employer called EMS. She states she has been compliant with all of her medications at home, but is still smoking approximately 10 cigarettes per day.   PAST MEDICAL HISTORY:  1.  Coronary artery disease; cardiac catheterization on 09/19/2013 showed 60% OM1, 60% distal LAD, and 60% proximal RCA.  2.  Hypertension. 3.  Diabetes.    SOCIAL HISTORY: She has smoked up to 2 packs a day of cigarettes, and is now currently smoking 10 cigarettes per day. Social alcohol use. No other illicit drug use. She works in Engineer, water at [employer].   FAMILY HISTORY: Nonsignificant for coronary artery disease.   HOME MEDICATIONS: Aspirin 81 mg p.o. daily, Lipitor 80 mg p.o. at bedtime, Celexa 20 mg p.o. daily, Plavix 75 mg p.o. daily, isosorbide 60 mg p.o. daily, metoprolol 25 mg p.o. daily, Ranexa 1000 mg p.o. b.i.d., Combivent 1 puff q.i.d.   REVIEW OF SYSTEMS:  CONSTITUTIONAL: No fatigue or malaise.  RESPIRATORY: Shortness of breath has resolved. No cough or wheezing.  CARDIOVASCULAR: Chest pain is improved, now 5 out of 10.  GASTROINTESTINAL: No  nausea, vomiting, or diarrhea.   PHYSICAL EXAMINATION:  VITAL SIGNS: Temperature 98.1, pulse 54, blood pressure 99/65, pulse oximetry 96% saturation on room air.  GENERAL: A and O x 3, in no acute distress.  HEENT: No JVD or carotid bruit.  LUNGS: Clear to auscultation bilaterally. No wheezes, rhonchi, or rales.  CARDIOVASCULAR: Normal S1, S2. No audible murmur.  ABDOMEN: Soft, nontender. Positive bowel sounds.  EXTREMITIES: No pedal edema.   LABORATORY DATA: Troponin 0.02 x 3. EKG shows sinus bradycardia at 59 beats per minute. No acute changes.   ASSESSMENT AND PLAN:  1.  Atypical chest pain with past medical history of coronary artery disease. The patient states she has been compliant with aspirin, Plavix, Imdur, Ranexa, beta blocker and high-dose Lipitor. It is unlikely that coronary artery disease has progressed since September 2015. This pain may be musculoskeletal, as patient admits to pulling right shoulder last week at work. Troponin was negative x 3. No acute changes on EKG. Per our office records and patient, she has only been taking isosorbide 60 mg daily. We will trial increasing this to 60 mg b.i.d. as chest pain may also be spasms of coronary secondary to tobacco use. Okay from cardiology standpoint for discharge, with follow up in office on Monday.   Thank you very much for this consult.    ____________________________ Alinda Sierras. Jarold Motto, PA-C ear:MT D: 02/08/2014 10:36:00 ET T: 02/08/2014 11:25:03 ET JOB#: [id number]  cc: Marjean Donna A. Margarito Courser, <Dictator> Alinda Sierras Leilene Diprima PA ELECTRONICALLY SIGNED 02/16/2014 13:18

## 2014-05-21 NOTE — H&P (Signed)
PATIENT NAME:  Krista Alvarez, Krista Alvarez MR#:  [id number] DATE OF BIRTH:  1970-05-01  DATE OF ADMISSION:  02/07/2014  PRIMARY CARE PHYSICIAN:  Dr. Yves Dill.   CARDIOLOGIST:  Dr. Adrian Blackwater.    REQUESTING PHYSICIAN:  Dr. Glennie Isle.    CHIEF COMPLAINT:  Chest pain.    HISTORY OF PRESENT ILLNESS: The patient is a 44 year old female with a known history of coronary artery disease, hypertension, and diabetes, is being admitted for unstable angina.  The patient was at work this morning, she started having severe chest pain radiating to her shoulder on her left side, 10 out of 10 in severity.  The pain was squeezing in nature, felt like her heart attack that she had the last time.  This was associated with shortness of breath. The patient was also feeling some blurry vision and dizziness at the same time and she came down to the Emergency Department considering her history of heart attack about 6 months ago when she had a cardiac catheterization by Dr. Welton Flakes.    While in the ED she was still complaining of pain.  Was given morphine along with nitroglycerin which helped ease out her pain some. When I saw her, her pain was about 5 out of 10 in severity. She denies any other symptoms at this time.   PAST MEDICAL HISTORY:   1. MI.   2. Coronary artery disease.   3. MRSA.   4. Hyperlipidemia.  5. Diabetes.   PAST SURGICAL HISTORY:  1.  Left hand surgery.  2.  Tonsillectomy.  3.  Coronary artery disease status post cardiac catheterization.    SOCIAL HISTORY: She smoked about 1 to 1-1/2 pack of cigarettes daily for a long time. Drinks 6-pack of beer every other weekend. Denies any other illicit substance use. She is currently employed at [employer].   FAMILY HISTORY: Father had a stroke in 73s, he died in 36s due to esophageal cancer. Mother died in her 7s to esophageal and lung cancer.   MEDICATIONS AT HOME:  1. Aspirin 81 mg p.o. daily.   2. Atorvastatin 80 mg p.o. at bedtime.    3. Citalopram 20 mg p.o. daily.   4. Plavix 75 mg p.o. daily.  5. Combivent 1 puff inhaled 4 times a day.    6. Isosorbide mononitrate 60 mg p.o. b.i.d.   7. Metformin 500 mg p.o. b.i.d.  8. Metoprolol 25 mg p.o. daily.  9. Nitrostat 0.4 mg sublingual every 5 minutes as needed.  10. Ranexa 500 mg p.o. b.i.d.   REVIEW OF SYSTEMS:  CONSTITUTIONAL: No fever, fatigue, weakness.  EYES: No blurred or double vision.  EARS, NOSE, AND THROAT: No tinnitus or ear pain.  RESPIRATORY: No cough, wheezing, hemoptysis.  CARDIOVASCULAR: Positive for chest pain.  No orthopnea, edema. Chest pain is radiating to her shoulder and the back.   GASTROINTESTINAL:  No nausea, vomiting, diarrhea.   GENITOURINARY:  No dysuria, hematuria.   ENDOCRINE: No polyuria, nocturia. HEMATOLOGY:  No anemia, easy bruising.  SKIN: No rash or lesion.  MUSCULOSKELETAL: Her pain is somewhat reproducible to her left shoulder and also in the chest. No history of gout.  NEUROLOGIC: No tingling, numbness, weakness.  PSYCHIATRIC: No history of anxiety, depression.    PHYSICAL EXAMINATION:   VITAL SIGNS:  Temperature 97.5, heart rate 60 per minute, respiration 13 per minute, blood pressure 136/101. She is saturating 99% on room air.   GENERAL:  The patient is a 44 year old female lying in the bed comfortably without  any acute distress.  EYES:  Pupils equal, round, reactive to light and accommodation.  No scleral icterus.  Extraocular muscles intact.  HEENT: Head atraumatic, normocephalic.  Oropharynx and nasopharynx clear.   NECK:  Supple, No JVD, No thyroid enlargement or tendereness.  LUNGS:  Clear to auscultation bilaterally.  No wheezes, rales, rhonchi, or crepitation.   CHEST WALL:  Is quite tender on palpation in the left, also on her left shoulder region there is some tenderness.  CARDIOVASCULAR: S1, S2 normal. No murmur, rubs, or gallop.   ABDOMEN: Soft, nontender, nondistended. Bowel sounds present.  No organomegaly or  mass.  EXTREMITIES: No pedal edema, No cyanosis or clubbing.  NEUROLOGIC: Cranial nerves II through XII intact. Muscle strength 5 out of 5 in all extremities. Sensory is intact.   PSYCHIATRIC:  The patient is alert and oriented x 3.  SKIN: No obvious rash, lesion, ulcer.  MUSCULOSKELETAL: No joint effusion or tenderness.  She does have palpable chest wall tenderness.   LABORATORY PANEL:  Normal BMP. Normal liver function tests. Normal CBC. Normal first set of cardiac enzymes. Normal coagulation panel.   Chest x-ray in the Emergency Department showed questionable mild right lower lobe infiltrate medially.   CT scan of the chest in the Emergency Department showed no thoracic aortic dissection or acute pulmonary embolism.   EKG shows sinus tachycardia at a rate of 59 per minute.    IMPRESSION AND PLAN:  1.  Unstable angina.  We will get serial troponin, obtain cardiology consult.  I have discussed the case with Dr. Adrian Blackwater from cardiology. We will continue aspirin and Plavix and statin however.  Also continue Imdur at this time. Her pain seems more muscular in nature on exam although cannot rule out underlying cardiac etiology.   2.  Diabetes mellitus: We will continue metformin, start her on sliding scale.   3.  Chronic obstructive pulmonary disease.  We will continue inhalers as she has been using at home.   4.  Hyperlipidemia.  We will continue statin.  Check fasting lipid profile.  5.  Acute on chronic bronchitis. We will start her on some empiric Levaquin at this time.    CODE STATUS: Full code.   TOTAL TIME TAKING CARE OF THIS PATIENT: 40 minutes.    ____________________________ Ellamae Sia. Sherryll Burger, MD vss:bu D: 02/07/2014 18:47:49 ET T: 02/07/2014 19:20:56 ET JOB#: [id number]  cc: Antonino Nienhuis S. Sherryll Burger, MD, <Dictator> Margaretann Loveless, MD Laurier Nancy, MD Ellamae Sia [facility] MD ELECTRONICALLY SIGNED 02/09/2014 10:13

## 2014-05-21 NOTE — Discharge Summary (Signed)
PATIENT NAME:  Krista Alvarez, Krista Alvarez MR#:  [id number] DATE OF BIRTH:  29-Aug-1970  DATE OF ADMISSION:  02/07/2014 DATE OF DISCHARGE:  02/08/2014  For detailed note, please see the history and physical done on admission by Dr. Sherryll Burger.   DIAGNOSES AT DISCHARGE: As follows:  1.  Chest pain, likely related to angina.  2.  History of coronary artery disease.  3.  Hypertension.  4.  Diabetes.  5.  Hyperlipidemia.  6.  Chronic obstructive pulmonary disease with ongoing tobacco abuse.   DIET:  The patient is being discharged on a low-sodium, low-fat, carb controlled diet.   ACTIVITY: As tolerated.   FOLLOW-UP:  Follow up Dr. Adrian Blackwater in the next 1 to 2 weeks. Also follow up with Dr. Yves Dill in the next 1 to 2 weeks.   DISCHARGE MEDICATIONS: Metoprolol tartrate 25 mg b.i.d., Plavix 75 mg daily, Combivent 1 puff q.i.d., atorvastatin 80 mg at bedtime, metformin 5 mg b.i.d., aspirin 81 mg daily, Imdur 60 mg b.i.d., Ranexa 5 mg b.i.d., Celexa 20 mg daily and sublingual nitroglycerin as needed.   BRIEF HOSPITAL COURSE: This is a 44 year old female with medical problems as mentioned above, presented to the hospital with chest pain.    PROBLEMS: 1. Chest pain. The most likely cause of the patient's chest pain was probably symptomatic angina. The patient has a previous history of coronary artery disease. She had a cardiac catheterization done in August 2015 which showed diffuse disease with 60% stenosis of the distal LAD and also to the obtuse marginal too. The patient apparently has been managed as an outpatient with long-acting nitrates and Ranexa. She was admitted to the hospital, observed on telemetry, had three sets of cardiac markers checked, which were negative. She was seen by cardiology. They did not think that the patient needed acute cardiac intervention. The patient had a recent stress test done at her cardiologist's office which was also essentially benign. At this point the patient's nitrates  are being increased from 60 mg daily to 60 mg twice daily along with continuation of her Ranexa.  She will also continue her aspirin, Plavix, beta blocker, and statin, and follow up with cardiologist as an outpatient.  2.  Chronic obstructive pulmonary disease. The patient had no acute COPD exacerbation. She will continue her Combivent. She was strongly advised to quit smoking.  3.  Diabetes. The patient was maintained on some sliding scale, insulin, will resume her metformin upon discharge.  4.  Hyperlipidemia. The patient was maintained on atorvastatin and she will resume that upon discharge.   CODE STATUS: The patient is a full code.   TIME SPENT:  On discharge is 40 minutes    ____________________________ Rolly Pancake. Cherlynn Kaiser, MD vjs:at D: 02/08/2014 15:26:11 ET T: 02/08/2014 15:44:53 ET JOB#: [id number]  cc: Rolly Pancake. Cherlynn Kaiser, MD, <Dictator> Laurier Nancy, MD Margaretann Loveless, MD [place] Siren MD ELECTRONICALLY SIGNED 02/14/2014 11:54

## 2014-05-24 ENCOUNTER — Other Ambulatory Visit: Payer: Self-pay | Admitting: Internal Medicine

## 2014-05-30 ENCOUNTER — Other Ambulatory Visit: Payer: Self-pay | Admitting: Internal Medicine

## 2014-05-30 DIAGNOSIS — M5442 Lumbago with sciatica, left side: Principal | ICD-10-CM

## 2014-05-30 DIAGNOSIS — M5441 Lumbago with sciatica, right side: Secondary | ICD-10-CM

## 2014-06-05 ENCOUNTER — Ambulatory Visit
Admission: RE | Admit: 2014-06-05 | Discharge: 2014-06-05 | Disposition: A | Discharge status: Home / Self Care | Payer: BLUE CROSS/BLUE SHIELD | Source: Ambulatory Visit | Attending: Internal Medicine | Admitting: Internal Medicine

## 2014-06-05 DIAGNOSIS — M5442 Lumbago with sciatica, left side: Principal | ICD-10-CM

## 2014-06-05 DIAGNOSIS — M545 Low back pain: Secondary | ICD-10-CM | POA: Diagnosis present

## 2014-06-05 DIAGNOSIS — M546 Pain in thoracic spine: Secondary | ICD-10-CM | POA: Diagnosis present

## 2014-06-05 DIAGNOSIS — M5134 Other intervertebral disc degeneration, thoracic region: Secondary | ICD-10-CM | POA: Insufficient documentation

## 2014-06-05 DIAGNOSIS — M5441 Lumbago with sciatica, right side: Secondary | ICD-10-CM

## 2014-08-30 ENCOUNTER — Encounter (HOSPITAL_COMMUNITY): Payer: Self-pay | Admitting: Emergency Medicine

## 2014-08-30 ENCOUNTER — Emergency Department (HOSPITAL_COMMUNITY): Payer: BLUE CROSS/BLUE SHIELD

## 2014-08-30 ENCOUNTER — Observation Stay (HOSPITAL_COMMUNITY)
Admission: EM | Admit: 2014-08-30 | Discharge: 2014-08-31 | DRG: 286 | Disposition: A | Discharge status: Home / Self Care | Payer: BLUE CROSS/BLUE SHIELD | Attending: Family Medicine | Admitting: Family Medicine

## 2014-08-30 DIAGNOSIS — I2510 Atherosclerotic heart disease of native coronary artery without angina pectoris: Secondary | ICD-10-CM | POA: Insufficient documentation

## 2014-08-30 DIAGNOSIS — I509 Heart failure, unspecified: Secondary | ICD-10-CM | POA: Diagnosis present

## 2014-08-30 DIAGNOSIS — E780 Pure hypercholesterolemia: Secondary | ICD-10-CM | POA: Diagnosis present

## 2014-08-30 DIAGNOSIS — R079 Chest pain, unspecified: Secondary | ICD-10-CM | POA: Diagnosis not present

## 2014-08-30 DIAGNOSIS — Z886 Allergy status to analgesic agent status: Secondary | ICD-10-CM

## 2014-08-30 DIAGNOSIS — I25110 Atherosclerotic heart disease of native coronary artery with unstable angina pectoris: Principal | ICD-10-CM | POA: Diagnosis present

## 2014-08-30 DIAGNOSIS — I252 Old myocardial infarction: Secondary | ICD-10-CM

## 2014-08-30 DIAGNOSIS — K219 Gastro-esophageal reflux disease without esophagitis: Secondary | ICD-10-CM | POA: Diagnosis present

## 2014-08-30 DIAGNOSIS — I25119 Atherosclerotic heart disease of native coronary artery with unspecified angina pectoris: Secondary | ICD-10-CM

## 2014-08-30 DIAGNOSIS — F17210 Nicotine dependence, cigarettes, uncomplicated: Secondary | ICD-10-CM | POA: Diagnosis present

## 2014-08-30 DIAGNOSIS — Z720 Tobacco use: Secondary | ICD-10-CM | POA: Insufficient documentation

## 2014-08-30 DIAGNOSIS — J449 Chronic obstructive pulmonary disease, unspecified: Secondary | ICD-10-CM | POA: Diagnosis not present

## 2014-08-30 DIAGNOSIS — F329 Major depressive disorder, single episode, unspecified: Secondary | ICD-10-CM | POA: Diagnosis present

## 2014-08-30 DIAGNOSIS — M545 Low back pain: Secondary | ICD-10-CM | POA: Diagnosis present

## 2014-08-30 DIAGNOSIS — E119 Type 2 diabetes mellitus without complications: Secondary | ICD-10-CM | POA: Diagnosis present

## 2014-08-30 DIAGNOSIS — I10 Essential (primary) hypertension: Secondary | ICD-10-CM | POA: Diagnosis present

## 2014-08-30 DIAGNOSIS — G43909 Migraine, unspecified, not intractable, without status migrainosus: Secondary | ICD-10-CM | POA: Diagnosis present

## 2014-08-30 DIAGNOSIS — F419 Anxiety disorder, unspecified: Secondary | ICD-10-CM | POA: Diagnosis present

## 2014-08-30 DIAGNOSIS — Z7982 Long term (current) use of aspirin: Secondary | ICD-10-CM

## 2014-08-30 DIAGNOSIS — M13851 Other specified arthritis, right hip: Secondary | ICD-10-CM | POA: Diagnosis present

## 2014-08-30 DIAGNOSIS — E118 Type 2 diabetes mellitus with unspecified complications: Secondary | ICD-10-CM | POA: Insufficient documentation

## 2014-08-30 DIAGNOSIS — F1290 Cannabis use, unspecified, uncomplicated: Secondary | ICD-10-CM | POA: Diagnosis present

## 2014-08-30 DIAGNOSIS — G8929 Other chronic pain: Secondary | ICD-10-CM | POA: Diagnosis present

## 2014-08-30 DIAGNOSIS — J189 Pneumonia, unspecified organism: Secondary | ICD-10-CM

## 2014-08-30 DIAGNOSIS — M13852 Other specified arthritis, left hip: Secondary | ICD-10-CM | POA: Diagnosis present

## 2014-08-30 DIAGNOSIS — Z7902 Long term (current) use of antithrombotics/antiplatelets: Secondary | ICD-10-CM

## 2014-08-30 DIAGNOSIS — Z79899 Other long term (current) drug therapy: Secondary | ICD-10-CM

## 2014-08-30 HISTORY — DX: Low back pain: M54.5

## 2014-08-30 HISTORY — DX: Chronic obstructive pulmonary disease, unspecified: J44.9

## 2014-08-30 HISTORY — DX: Atherosclerotic heart disease of native coronary artery without angina pectoris: I25.10

## 2014-08-30 HISTORY — DX: Migraine, unspecified, not intractable, without status migrainosus: G43.909

## 2014-08-30 HISTORY — DX: Pneumonia, unspecified organism: J18.9

## 2014-08-30 HISTORY — DX: Essential (primary) hypertension: I10

## 2014-08-30 HISTORY — DX: Major depressive disorder, single episode, unspecified: F32.9

## 2014-08-30 HISTORY — DX: Depression, unspecified: F32.A

## 2014-08-30 HISTORY — DX: Gastro-esophageal reflux disease without esophagitis: K21.9

## 2014-08-30 HISTORY — DX: Non-ST elevation (NSTEMI) myocardial infarction: I21.4

## 2014-08-30 HISTORY — DX: Pure hypercholesterolemia, unspecified: E78.00

## 2014-08-30 HISTORY — DX: Type 2 diabetes mellitus without complications: E11.9

## 2014-08-30 HISTORY — DX: Other chronic pain: G89.29

## 2014-08-30 HISTORY — DX: Unspecified osteoarthritis, unspecified site: M19.90

## 2014-08-30 HISTORY — DX: Anxiety disorder, unspecified: F41.9

## 2014-08-30 LAB — BASIC METABOLIC PANEL
Anion gap: 11 (ref 5–15)
BUN: 8 mg/dL (ref 6–20)
CO2: 21 mmol/L — AB (ref 22–32)
Calcium: 9.4 mg/dL (ref 8.9–10.3)
Chloride: 107 mmol/L (ref 101–111)
Creatinine, Ser: 0.76 mg/dL (ref 0.44–1.00)
GFR calc Af Amer: >60 mL/min (ref >60)
GFR calc non Af Amer: >60 mL/min (ref >60)
GLUCOSE: 126 mg/dL — AB (ref 65–99)
POTASSIUM: 3.9 mmol/L (ref 3.5–5.1)
Sodium: 139 mmol/L (ref 135–145)

## 2014-08-30 LAB — TROPONIN I

## 2014-08-30 LAB — CBC
HCT: 41.8 % (ref 36.0–46.0)
Hemoglobin: 14.4 g/dL (ref 12.0–15.0)
MCH: 33 pg (ref 26.0–34.0)
MCHC: 34.4 g/dL (ref 30.0–36.0)
MCV: 95.9 fL (ref 78.0–100.0)
PLATELETS: 166 10*3/uL (ref 150–400)
RBC: 4.36 MIL/uL (ref 3.87–5.11)
RDW: 13.9 % (ref 11.5–15.5)
WBC: 7.9 10*3/uL (ref 4.0–10.5)

## 2014-08-30 LAB — GLUCOSE, CAPILLARY: Glucose-Capillary: 114 mg/dL — ABNORMAL HIGH (ref 65–99)

## 2014-08-30 LAB — MRSA PCR SCREENING: MRSA by PCR: NEGATIVE

## 2014-08-30 LAB — HEPARIN LEVEL (UNFRACTIONATED): Heparin Unfractionated: 0.13 IU/mL — ABNORMAL LOW (ref 0.30–0.70)

## 2014-08-30 MED ORDER — ISOSORBIDE MONONITRATE ER 60 MG PO TB24
60.0000 mg | ORAL_TABLET | Freq: Every day | ORAL | Status: DC
  Filled 2014-08-30 (×2): qty 1

## 2014-08-30 MED ORDER — CLOPIDOGREL BISULFATE 75 MG PO TABS
75.0000 mg | ORAL_TABLET | Freq: Every day | ORAL | Status: DC
  Administered 2014-08-30 – 2014-08-31 (×2): 75 mg via ORAL
  Filled 2014-08-30 (×2): qty 1

## 2014-08-30 MED ORDER — HEPARIN (PORCINE) IN NACL 100-0.45 UNIT/ML-% IJ SOLN
1100.0000 [IU]/h | INTRAMUSCULAR | Status: DC
  Administered 2014-08-30: 900 [IU]/h via INTRAVENOUS
  Administered 2014-08-30: 1100 [IU]/h via INTRAVENOUS
  Filled 2014-08-30: qty 250

## 2014-08-30 MED ORDER — NITROGLYCERIN 0.4 MG SL SUBL: 0.4000 mg | SUBLINGUAL_TABLET | SUBLINGUAL | Status: DC | PRN

## 2014-08-30 MED ORDER — TRAZODONE HCL 50 MG PO TABS
50.0000 mg | ORAL_TABLET | Freq: Every day | ORAL | Status: DC
  Administered 2014-08-30: 50 mg via ORAL
  Filled 2014-08-30: qty 1

## 2014-08-30 MED ORDER — MORPHINE SULFATE 2 MG/ML IJ SOLN
1.0000 mg | Freq: Once | INTRAMUSCULAR | Status: AC
  Administered 2014-08-30: 1 mg via INTRAVENOUS
  Filled 2014-08-30: qty 1

## 2014-08-30 MED ORDER — MORPHINE SULFATE 2 MG/ML IJ SOLN
1.0000 mg | INTRAMUSCULAR | Status: DC | PRN
  Administered 2014-08-30: 1 mg via INTRAVENOUS
  Filled 2014-08-30: qty 1

## 2014-08-30 MED ORDER — HEPARIN BOLUS VIA INFUSION
4000.0000 [IU] | Freq: Once | INTRAVENOUS | Status: AC
  Administered 2014-08-30: 4000 [IU] via INTRAVENOUS
  Filled 2014-08-30: qty 4000

## 2014-08-30 MED ORDER — ACETAMINOPHEN 325 MG PO TABS
650.0000 mg | ORAL_TABLET | Freq: Four times a day (QID) | ORAL | Status: DC | PRN
  Administered 2014-08-30 – 2014-08-31 (×2): 650 mg via ORAL
  Filled 2014-08-30 (×3): qty 2

## 2014-08-30 MED ORDER — ASPIRIN EC 81 MG PO TBEC
81.0000 mg | DELAYED_RELEASE_TABLET | Freq: Every day | ORAL | Status: DC
  Administered 2014-08-31: 81 mg via ORAL
  Filled 2014-08-30: qty 1

## 2014-08-30 MED ORDER — TRAMADOL HCL 50 MG PO TABS
50.0000 mg | ORAL_TABLET | Freq: Two times a day (BID) | ORAL | Status: DC
  Filled 2014-08-30 (×2): qty 1

## 2014-08-30 MED ORDER — RANOLAZINE ER 500 MG PO TB12
1000.0000 mg | ORAL_TABLET | Freq: Two times a day (BID) | ORAL | Status: DC
  Administered 2014-08-30: 1000 mg via ORAL
  Filled 2014-08-30 (×4): qty 2

## 2014-08-30 MED ORDER — IPRATROPIUM-ALBUTEROL 20-100 MCG/ACT IN AERS: 1.0000 | INHALATION_SPRAY | Freq: Four times a day (QID) | RESPIRATORY_TRACT | Status: DC

## 2014-08-30 MED ORDER — HEPARIN BOLUS VIA INFUSION
2000.0000 [IU] | Freq: Once | INTRAVENOUS | Status: AC
Start: 2014-08-30 — End: 2014-08-30
  Administered 2014-08-30: 2000 [IU] via INTRAVENOUS
  Filled 2014-08-30: qty 2000

## 2014-08-30 MED ORDER — GI COCKTAIL ~~LOC~~: 30.0000 mL | Freq: Once | ORAL | Status: DC

## 2014-08-30 MED ORDER — GABAPENTIN 300 MG PO CAPS
300.0000 mg | ORAL_CAPSULE | Freq: Two times a day (BID) | ORAL | Status: DC
  Administered 2014-08-30: 300 mg via ORAL
  Filled 2014-08-30 (×3): qty 1

## 2014-08-30 MED ORDER — IPRATROPIUM-ALBUTEROL 0.5-2.5 (3) MG/3ML IN SOLN
3.0000 mL | Freq: Four times a day (QID) | RESPIRATORY_TRACT | Status: DC
  Administered 2014-08-30 – 2014-08-31 (×2): 3 mL via RESPIRATORY_TRACT
  Filled 2014-08-30 (×2): qty 3

## 2014-08-30 MED ORDER — METOPROLOL TARTRATE 25 MG PO TABS
25.0000 mg | ORAL_TABLET | Freq: Two times a day (BID) | ORAL | Status: DC
  Filled 2014-08-30 (×2): qty 1

## 2014-08-30 MED ORDER — DEXTROSE 5 % IV SOLN
1.0000 g | Freq: Once | INTRAVENOUS | Status: AC
  Administered 2014-08-30: 1 g via INTRAVENOUS
  Filled 2014-08-30: qty 10

## 2014-08-30 MED ORDER — ATORVASTATIN CALCIUM 80 MG PO TABS
80.0000 mg | ORAL_TABLET | Freq: Every day | ORAL | Status: DC
  Administered 2014-08-30: 80 mg via ORAL
  Filled 2014-08-30 (×4): qty 1

## 2014-08-30 MED ORDER — NITROGLYCERIN IN D5W 200-5 MCG/ML-% IV SOLN: 0.0000 ug/min | Freq: Once | INTRAVENOUS | Status: AC

## 2014-08-30 MED ORDER — DEXTROSE 5 % IV SOLN
500.0000 mg | Freq: Once | INTRAVENOUS | Status: AC
  Administered 2014-08-30: 500 mg via INTRAVENOUS
  Filled 2014-08-30: qty 500

## 2014-08-30 MED ORDER — NITROGLYCERIN IN D5W 200-5 MCG/ML-% IV SOLN
0.0000 ug/min | Freq: Once | INTRAVENOUS | Status: AC
  Administered 2014-08-30: 5 ug/min via INTRAVENOUS
  Filled 2014-08-30: qty 250

## 2014-08-30 NOTE — Progress Notes (Signed)
Family Medicine Teaching Service Daily Progress Note Intern Pager: [phone]  Patient name: Krista Alvarez Medical record number: [id number] Date of birth: 03/15/1970 Age: 44 y.o. Gender: female  Primary Care Provider: Margaretann Loveless, MD Consultants: Cardiology Code Status: FULL  Chief Complaint: chest pain  Assessment and Plan: [name] is a 44 y.o. female presenting with chest pain. PMH is significant for prior MI, Type II DM, COPD, CHF, chronic angina.   Chest pain:  History of MI about one year ago (she thinks). Reviewed [facility] DC summary 09/17/13, states she had NSTEMI and 3 vessel disease: 60% lesions in ostial medial, LAD, RCA, but decided only on medical management. Last stress test possibly done around January 2016. EKG in ED showed sinus rhythm with minimal ST elevation in inferior leads and prolonged QT. Troponins negative. Repeat EKG - sinus bradycardia, nonspecific ST and T wave abnormality. BNP 51. Endorsed increased chest pain overnight, and was subsequently seen by cardiology.   - Cont heparin drip - Will continue home meds: ranexa, imdur, plavix, ASA, metoprolol BID, lipitor - Per cardiology, cardiac cath this AM, with potential stenting based on results of repeat cath  COPD: not on home O2, stable - Cont home combivent - Cont pulse ox  Possible Left sided pneumonia: CXR showed early infiltrate vs retrocardiac atelectasis. Received one dose CTX and started on azithro for potential PNA, however feel this is less likely given no leukocytosis, no fever, and vague history of non-productive cough that could be attributed to COPD.   - S/p azithromycin, ceftriaxone. Will hold off on these unless worsens clinically.  Type II DM: last A1c 6.6 in 08/2013. CBG 114. - Hold home metformin - Monitor CBGs  Tobacco abuse: current 0.5 ppd - smoking cessation counseling  FEN/GI: heart healthy carb modified Prophylaxis: on full dose heparin gtt  Disposition: home pending  medical improvement   Subjective:  Krista Alvarez reports that her chest pain is relieved this AM, but she now has similar squeezing pain in her back. She said this pain was relieved by Tylenol earlier today. She still endorses a cough, but says this is unchanged from her usual COPD-associated cough.   Objective: Temp:  [97.4 F (36.3 C)-98.4 F (36.9 C)] 98 F (36.7 C) (08/11 0400) Pulse Rate:  [0-126] 51 (08/11 1145) Resp:  [0-25] 16 (08/11 1145) BP: (97-146)/(54-92) 125/76 mmHg (08/11 1145) SpO2:  [0 %-100 %] 95 % (08/11 1145) Weight:  [160 lb (72.576 kg)-161 lb 12.8 oz (73.392 kg)] 161 lb 12.8 oz (73.392 kg) (08/11 0400) Physical Exam: General: lying in bed in NAD Cardiovascular: RRR, no murmurs appreciated, minimal LE edema L foot to ankle Respiratory: expiratory wheeze, no crackles, non-labored Abdomen: soft, non-tender, non-distended, +BS Neuro: A&Ox3, no focal deficits Psych: appropriate mood and affect   Laboratory:  Recent Labs Lab 08/30/14 1329 08/31/14 0441  WBC 7.9 9.3  HGB 14.4 13.0  HCT 41.8 38.3  PLT 166 142*    Recent Labs Lab 08/30/14 1329 08/31/14 0956  NA 139 137  K 3.9 3.9  CL 107 106  CO2 21* 23  BUN 8 8  CREATININE 0.76 0.65  CALCIUM 9.4 8.8*  GLUCOSE 126* 139*    Imaging/Diagnostic Tests: Dg Chest Portable 1 View  08/30/2014   CLINICAL DATA:  Chest pain today. History of myocardial infarction, diabetes, hypertension and smoking. Initial encounter.  EXAM: PORTABLE CHEST - 1 VIEW  COMPARISON:  None.  FINDINGS: 1334 hour. The heart size and mediastinal contours are normal. There is faint  retrocardiac opacity which may reflect atelectasis or an early infiltrate. The right lung appears clear. There is no edema, pleural effusion or pneumothorax. No osseous abnormalities apparent.  IMPRESSION: Retrocardiac atelectasis or early infiltrate. No edema or significant pleural effusion.   Electronically Signed   By: Carey Bullocks M.D.   On: 08/30/2014 13:58      Abigail Shelbie Hutching, MD 08/31/2014, 12:00 PM PGY-1, [facility] Family Medicine FPTS Intern pager: [phone], text pages welcome

## 2014-08-30 NOTE — ED Provider Notes (Signed)
CSN: [id number]     Arrival date & time 08/30/14  1304 History   First MD Initiated Contact with Patient 08/30/14 1320     Chief Complaint  Patient presents with  . Chest Pain     (Consider location/radiation/quality/duration/timing/severity/associated sxs/prior Treatment) HPI Patient presents with chest pain today. Pressure going to left arm and back like her previous cardiac disease. States she has three-vessel disease and is not a Candidate for cata CABG candidate. Has had a little bit of a cough without fevers. She continues to smoke. She's had some relief with nitroglycerin. She took Nitro was last about a month ago. No leg swelling.  Past Medical History  Diagnosis Date  . Coronary artery disease   . Diabetes mellitus without complication    Past Surgical History  Procedure Laterality Date  . Cardiac catheterization     No family history on file. Social History  Substance Use Topics  . Smoking status: Current Every Day Smoker  . Smokeless tobacco: None  . Alcohol Use: Yes   OB History    No data available     Review of Systems  Constitutional: Positive for activity change.  Respiratory: Positive for cough.   Cardiovascular: Positive for chest pain.  Gastrointestinal: Negative for abdominal pain.  Genitourinary: Negative for flank pain.  Musculoskeletal: Negative for back pain.  Skin: Negative for wound.  Neurological: Negative for light-headedness.      Allergies  Ibuprofen  Home Medications   Prior to Admission medications   Medication Sig Start Date End Date Taking? Authorizing Provider  aspirin EC 81 MG tablet Take 81 mg by mouth daily.   Yes Historical Provider, MD  atorvastatin (LIPITOR) 80 MG tablet Take 80 mg by mouth daily.   Yes Historical Provider, MD  clopidogrel (PLAVIX) 75 MG tablet Take 75 mg by mouth daily.   Yes Historical Provider, MD  gabapentin (NEURONTIN) 300 MG capsule Take 300 mg by mouth 2 (two) times daily.   Yes Historical  Provider, MD  Ipratropium-Albuterol (COMBIVENT RESPIMAT) 20-100 MCG/ACT AERS respimat Inhale 1 puff into the lungs every 6 (six) hours.   Yes Historical Provider, MD  isosorbide mononitrate (IMDUR) 60 MG 24 hr tablet Take 60 mg by mouth daily.   Yes Historical Provider, MD  metFORMIN (GLUCOPHAGE) 500 MG tablet Take 500 mg by mouth 2 (two) times daily with a meal.   Yes Historical Provider, MD  metoprolol tartrate (LOPRESSOR) 25 MG tablet Take 25 mg by mouth 2 (two) times daily.   Yes Historical Provider, MD  nitroGLYCERIN (NITROSTAT) 0.4 MG SL tablet Place 0.4 mg under the tongue every 5 (five) minutes as needed for chest pain.   Yes Historical Provider, MD  ranolazine (RANEXA) 1000 MG SR tablet Take 1,000 mg by mouth 2 (two) times daily.   Yes Historical Provider, MD  traMADol (ULTRAM) 50 MG tablet Take 50 mg by mouth 2 (two) times daily.   Yes Historical Provider, MD  traZODone (DESYREL) 50 MG tablet Take 50 mg by mouth at bedtime.   Yes Historical Provider, MD   BP 102/62 mmHg  Pulse 63  Temp(Src) 98.4 F (36.9 C) (Oral)  Resp 20  Ht 5\' 4"  (1.626 m)  Wt 160 lb (72.576 kg)  BMI 27.45 kg/m2  SpO2 99%  LMP 07/30/2014 Physical Exam  Constitutional: She appears well-developed.  HENT:  Head: Normocephalic.  Eyes: Pupils are equal, round, and reactive to light.  Neck: Neck supple.  Cardiovascular: Normal rate and regular rhythm.  Pulmonary/Chest: Effort normal.  Left-sided wheezes without rales  Abdominal: Soft. There is no tenderness.  Musculoskeletal: Normal range of motion.  Neurological: She is alert.  Skin: Skin is warm.    ED Course  Procedures (including critical care time) Labs Review Labs Reviewed  BASIC METABOLIC PANEL - Abnormal; Notable for the following:    CO2 21 (*)    Glucose, Bld 126 (*)    All other components within normal limits  CBC  TROPONIN I    Imaging Review Dg Chest Portable 1 View  08/30/2014   CLINICAL DATA:  Chest pain today. History of  myocardial infarction, diabetes, hypertension and smoking. Initial encounter.  EXAM: PORTABLE CHEST - 1 VIEW  COMPARISON:  None.  FINDINGS: 1334 hour. The heart size and mediastinal contours are normal. There is faint retrocardiac opacity which may reflect atelectasis or an early infiltrate. The right lung appears clear. There is no edema, pleural effusion or pneumothorax. No osseous abnormalities apparent.  IMPRESSION: Retrocardiac atelectasis or early infiltrate. No edema or significant pleural effusion.   Electronically Signed   By: Carey Bullocks M.D.   On: 08/30/2014 13:58     EKG Interpretation   Date/Time:  Wednesday August 30 2014 13:12:17 EDT Ventricular Rate:  80 PR Interval:  157 QRS Duration: 75 QT Interval:  454 QTC Calculation: 524 R Axis:   52 Text Interpretation:  Sinus rhythm Minimal ST elevation, inferior leads  Prolonged QT interval Confirmed by Rubin Payor  MD, Harrold Donath [phone]) on  08/30/2014 1:16:42 PM      MDM   Final diagnoses:  Chest pain, unspecified chest pain type  CAP (community acquired pneumonia)  Coronary artery disease involving native coronary artery of native heart with angina pectoris    Patient with chest pain. Started on heparin and nitroglycerin. Pain improved on nitroglycerin drip. Enzymes negative. States it feels like previous cardiac pain. Does have some wheezes and possible infiltrate on the same side on the x-ray. Also treated for the acquired pneumonia. Will admit to internal medicine.  CRITICAL CARE Performed by: Billee Cashing Total critical care time:30  Critical care time was exclusive of separately billable procedures and treating other patients. Critical care was necessary to treat or prevent imminent or life-threatening deterioration. Critical care was time spent personally by me on the following activities: development of treatment plan with patient and/or surrogate as well as nursing, discussions with consultants, evaluation of  patient's response to treatment, examination of patient, obtaining history from patient or surrogate, ordering and performing treatments and interventions, ordering and review of laboratory studies, ordering and review of radiographic studies, pulse oximetry and re-evaluation of patient's condition.     Benjiman Core, MD 08/30/14 [phone]

## 2014-08-30 NOTE — ED Notes (Signed)
Per [facility], pt from home, stated she started feeling CP at 930am this morning, took 1 nitro with pain relief. Pain returned right before patients arrival to ed. Pt given 2 nitro, 324 asa, and 4 zofran by ems. Pain went from a 7 to a 5. Pt diaphoretic and nauseated with SOB. Pt has a hx of 3 major vessels blocked, unable to due surgery due to her veins being "so small they are unsure of able to complete it". Three main vessels going into her heart. Pt is on long acting nitro and plavix. Pt is AAOX4, VSS.

## 2014-08-30 NOTE — Progress Notes (Signed)
ANTICOAGULATION CONSULT NOTE - Initial Consult  Pharmacy Consult for heparin Indication: chest pain/ACS  Allergies  Allergen Reactions  . Ibuprofen     "interacted with meds" per pt's family    Patient Measurements: Height: 5\' 4"  (162.6 cm) Weight: 161 lb (73.029 kg) IBW/kg (Calculated) : 54.7 Heparin Dosing Weight: 70kg  Vital Signs: Temp: 97.4 F (36.3 C) (08/10 2125) Temp Source: Oral (08/10 2125) BP: 112/67 mmHg (08/10 2125) Pulse Rate: 57 (08/10 2125)  Labs:  Recent Labs  08/30/14 1329 08/30/14 1928 08/30/14 2146  HGB 14.4  --   --   HCT 41.8  --   --   PLT 166  --   --   HEPARINUNFRC  --   --  0.13*  CREATININE 0.76  --   --   TROPONINI <0.03 <0.03  --     Estimated Creatinine Clearance: 89.7 mL/min (by C-G formula based on Cr of 0.76).   Medical History: Past Medical History  Diagnosis Date  . Coronary artery disease   . Diabetes mellitus without complication     Medications:  Infusions:  . heparin 900 Units/hr (08/30/14 1348)    Assessment: 43 yof presented to the ED with CP. To start IV heparin for anticoagulation. Baseline labs are pending but BMET and CBC were normal in January. She is not on any anticoagulation PTA except for plavix.   HL is subtherapeutic at 0.13 on heparin 900 units/hr. Nurse reports no issues with infusion or bleeding.  Goal of Therapy:  Heparin level 0.3-0.7 units/ml Monitor platelets by anticoagulation protocol: Yes   Plan:  Heparin bolus 2000 units and increase rate to 1100 units/hr 6h HL Daily heparin level and CBC  Arlean Hopping. Newman Pies, PharmD Clinical Pharmacist Pager [phone] 08/30/2014,10:25 PM

## 2014-08-30 NOTE — Progress Notes (Signed)
Pt seen as part of nightly rounds  Pt reports that her left sided chest pain has returned but no shortness of breath. She reports feeling hot and clammy with continued headache. She also reports left sided back pain which has been continued since admission  BP 106/67 mmHg  Pulse 62  Temp(Src) 97.4 F (36.3 C) (Oral)  Resp 18  Ht 5\' 4"  (1.626 m)  Wt 161 lb (73.029 kg)  BMI 27.62 kg/m2  SpO2 99%  LMP 07/30/2014  Exam General: No acute distress, laying in bed CV: RRR, no murmurs Pulm: CTAB, no wheezes or crackles MSK: No chest tenderness to palpation, mild tenderness over left scapula  A/P 44 y/o admitted for ACS ruleout now with continued chest pain. Pt reports long history of GERD and is unable to say is this pain is different, but he is having flushing now whichis different.   - Stat EKG now - stat troponin now. troponin to this point have ben negative - Stop wean of nitro drip until ekg and troponin result - morphine for pain - Will order GI cocktail to be given if cardiac markers remain negative to address potential GI cause of her pain   Continue to monitor closely  Anabelle Bungert A. Kennon Rounds MD, MS Family Medicine Resident PGY-2 Pager [phone]

## 2014-08-30 NOTE — H&P (Signed)
Family Medicine Teaching [facility] Admission History and Physical Service Pager: [phone]  Patient name: Krista Alvarez Medical record number: [id number] Date of birth: December 01, 1970 Age: 44 y.o. Gender: female  Primary Care Provider: Margaretann Loveless, MD Consultants: None Code Status: FULL  Chief Complaint: chest pain  Assessment and Plan: [name] is a 44 y.o. female presenting with chest pain. PMH is significant for prior MI, Type II DM, COPD, CHF, chronic angina.   Chest pain: reports sharp stabbing pain on L chest radiating to L shoulder for one day unrelieved by nitro at home, partially relieved in ED. Associated SOB. History of MI about one year ago (she thinks). Reviewed [facility] DC summary 09/17/13, states she had NSTEMI and 3 vessel disease: 60% lesions in ostial medial, LAD, RCA, but decided only on medical management. Last stress test possibly done around January 2016. EKG in ED showed sinus rhythm with minimal ST elevation in inferior leads and prolonged QT. CXR showed early infiltrate vs retrocardiac atelectasis. Received one dose CTX and started on azithro for potential PNA, however feel this is less likely given no leukocytosis, no fever, and vague history of non-productive cough that could be attributed to COPD. iStat troponin negative. Started on heparin drip and nitro drip in ED, which she said relieved her pain.  - Admit, attending Dr. Gwendolyn Grant - Trend troponins - Repeat EKG in AM - Obtain echo - BNP pending  - Consider cardiac consult if symptoms worsen/abnormal labs given cardiac history - Cont azithro - D/c nitro drip - Cont heparin drip - will continue home meds: ranexa, imdur, plavix, ASA, metoprolol BID, lipitor  COPD: not on home O2, stable - Cont home combivent - Cont pulse ox  Possible Left sided pneumonia: feel this is less likely per reasons above. - s/p azithromycin, ceftriaxone. Will hold off on these unless worsens clinically.  Type II DM: last  A1c 6.6 in 08/2013 - Hold home metformin - Monitor CBGs  Tobacco abuse: current 0.5ppd - smoking cessation counseling  FEN/GI: heart healthy carb modified Prophylaxis: on full dose heparin gtt  Disposition: admit to stepdown given heparin drip  History of Present Illness: Krista Alvarez is a 44 y.o. female presenting with chest pain.   Ms. Aponte reports that this morning, she began to experience a sharp, stabbing pain in her L chest radiating to her L shoulder. Nitroglycerin tablet did not relieve her pain. She was sitting down when the pain started, and had not eaten anything yet. She has been experiencing nausea with dry heaving, as well as SOB and a cough for the past few days. She attributes both the cough and SOB to COPD. She has not been diaphoretic, but says she has felt hot. Endorses HA but says she thinks that is due to the nitro drip she is on in the ED. She does state she uses 4-5 pillows at night.  She says she is normally fairly active, mowing the yard with a push mower, cleaning the house, and doing other similar activities.   She says she had an MI maybe last August, and now has three vessel disease. She said she is not a surgical candidate because her vessels are too "shriveled up" for surgery. She sees Dr. Ursula Beath for cardiology follow-up at [facility]. Last appointment was about one month ago, and he did not make any changes to her medication regimen.    Review Of Systems: Per HPI with the following additions: denies abdominal pain, vomiting  Otherwise 12 point review of  systems was performed and was unremarkable.  Patient Active Problem List   Diagnosis Date Noted  . Chest pain 08/30/2014   Past Medical History: Past Medical History  Diagnosis Date  . Coronary artery disease   . Diabetes mellitus without complication    Past Surgical History: Past Surgical History  Procedure Laterality Date  . Cardiac catheterization     Social History: Social History   Substance Use Topics  . Smoking status: Current Every Day Smoker  . Smokeless tobacco: None  . Alcohol Use: Yes   Additional social history: smokes half a pack of cigarettes a day, drinks alcohol "once in a while", denies illicit drug use  Please also refer to relevant sections of EMR.  Family History: No family history on file. Allergies and Medications: Allergies  Allergen Reactions  . Ibuprofen     "interacted with meds" per pt's family   No current facility-administered medications on file prior to encounter.   No current outpatient prescriptions on file prior to encounter.    Objective: BP 102/62 mmHg  Pulse 63  Temp(Src) 98.4 F (36.9 C) (Oral)  Resp 20  Ht 5\' 4"  (1.626 m)  Wt 160 lb (72.576 kg)  BMI 27.45 kg/m2  SpO2 99%  LMP 07/30/2014 Exam: General: lying in bed in NAD Eyes: PERRLA, EOMI ENTM: MMM, no oropharyngeal lesions Cardiovascular: RRR, no murmurs appreciated, no LE edema Respiratory: faint L-sided wheeze, no crackles, non-labored Abdomen: soft, non-tender, non-distended, +BS Skin: no rashes or bruises noted Neuro: A&Ox3, no focal deficits Psych: appropriate mood and affect. Normal thought content and speech. Some evidence of memory deficits but not formally tested.  Labs and Imaging: CBC BMET   Recent Labs Lab 08/30/14 1329  WBC 7.9  HGB 14.4  HCT 41.8  PLT 166    Recent Labs Lab 08/30/14 1329  NA 139  K 3.9  CL 107  CO2 21*  BUN 8  CREATININE 0.76  GLUCOSE 126*  CALCIUM 9.4     Dg Chest Portable 1 View  08/30/2014   CLINICAL DATA:  Chest pain today. History of myocardial infarction, diabetes, hypertension and smoking. Initial encounter.  EXAM: PORTABLE CHEST - 1 VIEW  COMPARISON:  None.  FINDINGS: 1334 hour. The heart size and mediastinal contours are normal. There is faint retrocardiac opacity which may reflect atelectasis or an early infiltrate. The right lung appears clear. There is no edema, pleural effusion or pneumothorax.  No osseous abnormalities apparent.  IMPRESSION: Retrocardiac atelectasis or early infiltrate. No edema or significant pleural effusion.   Electronically Signed   By: Carey Bullocks M.D.   On: 08/30/2014 13:58     Abigail Shelbie Hutching, MD 08/30/2014, 6:06 PM PGY-1, [facility] Family Medicine FPTS Intern pager: [phone], text pages welcome  I have seen and examined the patient. I have read and agree with the above note. My changes are noted in blue.  Tawni Carnes, MD 08/30/2014, 9:07 PM PGY-3, [facility] Family Medicine FPTS Intern Pager: [phone], text pages welcome

## 2014-08-30 NOTE — Progress Notes (Signed)
ANTICOAGULATION CONSULT NOTE - Initial Consult  Pharmacy Consult for heparin Indication: chest pain/ACS  Allergies  Allergen Reactions  . Ibuprofen     Patient Measurements: Height: 5\' 4"  (162.6 cm) (per pt report) Weight: 160 lb (72.576 kg) (per patient report) IBW/kg (Calculated) : 54.7 Heparin Dosing Weight: 70kg  Vital Signs: Temp: 98.4 F (36.9 C) (08/10 1321) Temp Source: Oral (08/10 1321) BP: 123/75 mmHg (08/10 1330) Pulse Rate: 66 (08/10 1330)  Labs: No results for input(s): HGB, HCT, PLT, APTT, LABPROT, INR, HEPARINUNFRC, CREATININE, CKTOTAL, CKMB, TROPONINI in the last 72 hours.  CrCl cannot be calculated (Patient has no serum creatinine result on file.).   Medical History: Past Medical History  Diagnosis Date  . Coronary artery disease   . Diabetes mellitus without complication     Medications:  Infusions:  . heparin      Assessment: 67 yof presented to the ED with CP. To start IV heparin for anticoagulation. Baseline labs are pending but BMET and CBC were normal in January. She is not on any anticoagulation PTA except for plavix.   Goal of Therapy:  Heparin level 0.3-0.7 units/ml Monitor platelets by anticoagulation protocol: Yes   Plan:  - Heparin bolus 4000 units IV x 1 - Heparin gtt 900 units/hr - Check a 6 hour heparin level - Daily heparin level and CBC  Krista Alvarez, Drake Leach 08/30/2014,1:41 PM

## 2014-08-31 ENCOUNTER — Encounter (HOSPITAL_COMMUNITY): Payer: Self-pay | Admitting: Cardiovascular Disease

## 2014-08-31 ENCOUNTER — Encounter (HOSPITAL_COMMUNITY)
Admission: EM | Disposition: A | Discharge status: Home / Self Care | Payer: BLUE CROSS/BLUE SHIELD | Source: Home / Self Care | Attending: Family Medicine

## 2014-08-31 ENCOUNTER — Other Ambulatory Visit (HOSPITAL_COMMUNITY): Payer: BLUE CROSS/BLUE SHIELD

## 2014-08-31 DIAGNOSIS — I2510 Atherosclerotic heart disease of native coronary artery without angina pectoris: Secondary | ICD-10-CM | POA: Insufficient documentation

## 2014-08-31 DIAGNOSIS — R079 Chest pain, unspecified: Secondary | ICD-10-CM | POA: Diagnosis not present

## 2014-08-31 DIAGNOSIS — I209 Angina pectoris, unspecified: Secondary | ICD-10-CM

## 2014-08-31 DIAGNOSIS — E118 Type 2 diabetes mellitus with unspecified complications: Secondary | ICD-10-CM | POA: Insufficient documentation

## 2014-08-31 DIAGNOSIS — J449 Chronic obstructive pulmonary disease, unspecified: Secondary | ICD-10-CM | POA: Diagnosis not present

## 2014-08-31 DIAGNOSIS — E119 Type 2 diabetes mellitus without complications: Secondary | ICD-10-CM | POA: Insufficient documentation

## 2014-08-31 DIAGNOSIS — Z720 Tobacco use: Secondary | ICD-10-CM | POA: Insufficient documentation

## 2014-08-31 DIAGNOSIS — I25119 Atherosclerotic heart disease of native coronary artery with unspecified angina pectoris: Secondary | ICD-10-CM | POA: Diagnosis not present

## 2014-08-31 HISTORY — PX: CARDIAC CATHETERIZATION: SHX172

## 2014-08-31 LAB — BASIC METABOLIC PANEL
Anion gap: 8 (ref 5–15)
BUN: 8 mg/dL (ref 6–20)
CO2: 23 mmol/L (ref 22–32)
Calcium: 8.8 mg/dL — ABNORMAL LOW (ref 8.9–10.3)
Chloride: 106 mmol/L (ref 101–111)
Creatinine, Ser: 0.65 mg/dL (ref 0.44–1.00)
GFR calc Af Amer: >60 mL/min (ref >60)
GLUCOSE: 139 mg/dL — AB (ref 65–99)
POTASSIUM: 3.9 mmol/L (ref 3.5–5.1)
Sodium: 137 mmol/L (ref 135–145)

## 2014-08-31 LAB — CBC
HCT: 38.3 % (ref 36.0–46.0)
Hemoglobin: 13 g/dL (ref 12.0–15.0)
MCH: 32.8 pg (ref 26.0–34.0)
MCHC: 33.9 g/dL (ref 30.0–36.0)
MCV: 96.7 fL (ref 78.0–100.0)
Platelets: 142 10*3/uL — ABNORMAL LOW (ref 150–400)
RBC: 3.96 MIL/uL (ref 3.87–5.11)
RDW: 14 % (ref 11.5–15.5)
WBC: 9.3 10*3/uL (ref 4.0–10.5)

## 2014-08-31 LAB — LIPID PANEL
CHOLESTEROL: 102 mg/dL (ref 0–200)
HDL: 30 mg/dL — AB (ref >40)
LDL Cholesterol: 46 mg/dL (ref 0–99)
Total CHOL/HDL Ratio: 3.4 RATIO
Triglycerides: 128 mg/dL (ref <150)
VLDL: 26 mg/dL (ref 0–40)

## 2014-08-31 LAB — TROPONIN I
Troponin I: <0.03 ng/mL (ref <0.031)
Troponin I: <0.03 ng/mL (ref <0.031)

## 2014-08-31 LAB — HEPARIN LEVEL (UNFRACTIONATED): HEPARIN UNFRACTIONATED: 0.37 [IU]/mL (ref 0.30–0.70)

## 2014-08-31 LAB — BRAIN NATRIURETIC PEPTIDE: B Natriuretic Peptide: 51 pg/mL (ref 0.0–100.0)

## 2014-08-31 LAB — PROTIME-INR
INR: 1.08 (ref 0.00–1.49)
Prothrombin Time: 14.2 seconds (ref 11.6–15.2)

## 2014-08-31 SURGERY — LEFT HEART CATH AND CORONARY ANGIOGRAPHY

## 2014-08-31 MED ORDER — HEPARIN (PORCINE) IN NACL 2-0.9 UNIT/ML-% IJ SOLN
INTRAMUSCULAR | Status: AC
  Filled 2014-08-31: qty 1500

## 2014-08-31 MED ORDER — MIDAZOLAM HCL 2 MG/2ML IJ SOLN
INTRAMUSCULAR | Status: DC | PRN
  Administered 2014-08-31: 2 mg via INTRAVENOUS

## 2014-08-31 MED ORDER — SODIUM CHLORIDE 0.9 % IJ SOLN
3.0000 mL | Freq: Two times a day (BID) | INTRAMUSCULAR | Status: DC
  Administered 2014-08-31: 3 mL via INTRAVENOUS

## 2014-08-31 MED ORDER — FENTANYL CITRATE (PF) 100 MCG/2ML IJ SOLN
INTRAMUSCULAR | Status: AC
  Filled 2014-08-31: qty 4

## 2014-08-31 MED ORDER — VERAPAMIL HCL 2.5 MG/ML IV SOLN
INTRAVENOUS | Status: AC
  Filled 2014-08-31: qty 2

## 2014-08-31 MED ORDER — VERAPAMIL HCL 2.5 MG/ML IV SOLN
INTRAVENOUS | Status: DC | PRN
  Administered 2014-08-31: 10:00:00 via INTRA_ARTERIAL

## 2014-08-31 MED ORDER — SODIUM CHLORIDE 0.9 % IJ SOLN: 3.0000 mL | INTRAMUSCULAR | Status: DC | PRN

## 2014-08-31 MED ORDER — IOHEXOL 350 MG/ML SOLN
INTRAVENOUS | Status: DC | PRN
  Administered 2014-08-31: 90 mL via INTRA_ARTERIAL

## 2014-08-31 MED ORDER — NITROGLYCERIN 1 MG/10 ML FOR IR/CATH LAB
INTRA_ARTERIAL | Status: AC
  Filled 2014-08-31: qty 10

## 2014-08-31 MED ORDER — SODIUM CHLORIDE 0.9 % IV SOLN: 250.0000 mL | INTRAVENOUS | Status: DC | PRN

## 2014-08-31 MED ORDER — HEPARIN SODIUM (PORCINE) 1000 UNIT/ML IJ SOLN
INTRAMUSCULAR | Status: AC
  Filled 2014-08-31: qty 1

## 2014-08-31 MED ORDER — SODIUM CHLORIDE 0.9 % WEIGHT BASED INFUSION: 1.0000 mL/kg/h | INTRAVENOUS | Status: DC

## 2014-08-31 MED ORDER — HEPARIN SODIUM (PORCINE) 1000 UNIT/ML IJ SOLN
INTRAMUSCULAR | Status: DC | PRN
  Administered 2014-08-31: 3500 [IU] via INTRAVENOUS

## 2014-08-31 MED ORDER — FENTANYL CITRATE (PF) 100 MCG/2ML IJ SOLN
INTRAMUSCULAR | Status: DC | PRN
  Administered 2014-08-31: 50 ug via INTRAVENOUS

## 2014-08-31 MED ORDER — SODIUM CHLORIDE 0.9 % WEIGHT BASED INFUSION: 3.0000 mL/kg/h | INTRAVENOUS | Status: DC

## 2014-08-31 MED ORDER — ACETAMINOPHEN 325 MG PO TABS
650.0000 mg | ORAL_TABLET | Freq: Once | ORAL | Status: AC
  Administered 2014-08-31: 650 mg via ORAL

## 2014-08-31 MED ORDER — SODIUM CHLORIDE 0.9 % IV SOLN
INTRAVENOUS | Status: AC
  Administered 2014-08-31: 11:00:00 via INTRAVENOUS

## 2014-08-31 MED ORDER — MIDAZOLAM HCL 2 MG/2ML IJ SOLN
INTRAMUSCULAR | Status: AC
  Filled 2014-08-31: qty 4

## 2014-08-31 MED ORDER — ASPIRIN 81 MG PO CHEW: 81.0000 mg | CHEWABLE_TABLET | ORAL | Status: DC

## 2014-08-31 MED ORDER — LIDOCAINE HCL (PF) 1 % IJ SOLN
INTRAMUSCULAR | Status: AC
Start: 2014-08-31 — End: 2014-08-31
  Filled 2014-08-31: qty 30

## 2014-08-31 SURGICAL SUPPLY — 11 items

## 2014-08-31 NOTE — Progress Notes (Signed)
Reviewed discharge instructions with patient and family and they stated their understanding.  Discharged home with family via wheelchair.  Krista Alvarez   

## 2014-08-31 NOTE — Progress Notes (Signed)
Spoke with Dr. Clifton James whom performed patient's cardiac catheterization.  He states from cardiology standpoint, patient can be discharged today.  Family Practice resident notified.  Krista Alvarez

## 2014-08-31 NOTE — Interval H&P Note (Signed)
History and Physical Interval Note:  08/31/2014 10:15 AM  Krista Alvarez  has presented today for cardiac cath with the diagnosis of unstable angina.  The various methods of treatment have been discussed with the patient and family. After consideration of risks, benefits and other options for treatment, the patient has consented to  Procedure(s): Left Heart Cath and Coronary Angiography (N/A) as a surgical intervention .  The patient's history has been reviewed, patient examined, no change in status, stable for surgery.  I have reviewed the patient's chart and labs.  Questions were answered to the patient's satisfaction.   Cath Lab Visit (complete for each Cath Lab visit)  Clinical Evaluation Leading to the Procedure:   ACS: No.  Non-ACS:    Anginal Classification: CCS III  Anti-ischemic medical therapy: Maximal Therapy (2 or more classes of medications)  Non-Invasive Test Results: No non-invasive testing performed  Prior CABG: No previous CABG          Krista Alvarez

## 2014-08-31 NOTE — Consult Note (Signed)
CONSULT NOTE  Date: 08/31/2014               Patient Name:  Krista Alvarez MRN: [id number]  DOB: 03-Jan-1971 Age / Sex: 44 y.o., female        PCP: Margaretann Loveless Primary Cardiologist: Johny Blamer             Referring Physician: Gwendolyn Grant               Reason for Consult: Chest pain , hx of CAD            History of Present Illness: Patient is a 44 y.o. female with a PMHx of moderate diffuse coronary artery disease, COPD, diabetes mellitus type 2, ongoing cigarette smoking who was admitted to [facility] on 08/30/2014 for evaluation of chest discomfort and abdominal pain..   She started having some left-sided stabbing chest pain. It radiated to her left shoulder. It was not relieved with nitroglycerin. This was preceded by some nausea and dry heaving. She's also had a cough and shortness of breath for the past couple days.  The pain is very similar to her previous MI pain last year.  I personally reviewed the cardiac cath from 2015. She has a 60-70 % distal LAD stenosis and mild moderate disease in the RCA  She has CP frequently .  She takes SL NTG only when the CP is severe.     Medications: Outpatient medications: Prescriptions prior to admission  Medication Sig Dispense Refill Last Dose  . aspirin EC 81 MG tablet Take 81 mg by mouth daily.   08/30/2014 at Unknown time  . atorvastatin (LIPITOR) 80 MG tablet Take 80 mg by mouth daily.   08/30/2014 at Unknown time  . clopidogrel (PLAVIX) 75 MG tablet Take 75 mg by mouth daily.   08/30/2014 at Unknown time  . gabapentin (NEURONTIN) 300 MG capsule Take 300 mg by mouth 2 (two) times daily.   08/30/2014 at Unknown time  . Ipratropium-Albuterol (COMBIVENT RESPIMAT) 20-100 MCG/ACT AERS respimat Inhale 1 puff into the lungs every 6 (six) hours.   08/30/2014 at Unknown time  . isosorbide mononitrate (IMDUR) 60 MG 24 hr tablet Take 60 mg by mouth daily.   08/30/2014 at Unknown time  . metFORMIN (GLUCOPHAGE) 500 MG tablet Take 500 mg by  mouth 2 (two) times daily with a meal.   08/30/2014 at Unknown time  . metoprolol tartrate (LOPRESSOR) 25 MG tablet Take 25 mg by mouth 2 (two) times daily.   08/30/2014 at 900  . nitroGLYCERIN (NITROSTAT) 0.4 MG SL tablet Place 0.4 mg under the tongue every 5 (five) minutes as needed for chest pain.   08/30/2014 at Unknown time  . ranolazine (RANEXA) 1000 MG SR tablet Take 1,000 mg by mouth 2 (two) times daily.   08/30/2014 at Unknown time  . traMADol (ULTRAM) 50 MG tablet Take 50 mg by mouth 2 (two) times daily.   08/30/2014 at Unknown time  . traZODone (DESYREL) 50 MG tablet Take 50 mg by mouth at bedtime.   08/29/2014 at Unknown time    Current medications: Current Facility-Administered Medications  Medication Dose Route Frequency Provider Last Rate Last Dose  . acetaminophen (TYLENOL) tablet 650 mg  650 mg Oral Q6H PRN Bonney Aid, MD   650 mg at 08/31/14 0430  . aspirin EC tablet 81 mg  81 mg Oral Daily Marquette Saa, MD      . atorvastatin (LIPITOR) tablet 80 mg  80 mg Oral Daily Marquette Saa, MD   80 mg at 08/30/14 2227  . clopidogrel (PLAVIX) tablet 75 mg  75 mg Oral Daily Marquette Saa, MD   75 mg at 08/30/14 1928  . gabapentin (NEURONTIN) capsule 300 mg  300 mg Oral BID Marquette Saa, MD   300 mg at 08/30/14 2228  . gi cocktail (Maalox,Lidocaine,Donnatal)  30 mL Oral Once Bonney Aid, MD   Stopped at 08/30/14 2315  . heparin ADULT infusion 100 units/mL (25000 units/250 mL)  1,100 Units/hr Intravenous Continuous Marquita Palms, RPH 11 mL/hr at 08/31/14 0220 2,000 Units at 08/31/14 0220  . ipratropium-albuterol (DUONEB) 0.5-2.5 (3) MG/3ML nebulizer solution 3 mL  3 mL Nebulization Q6H Tobey Grim, MD   3 mL at 08/30/14 1937  . isosorbide mononitrate (IMDUR) 24 hr tablet 60 mg  60 mg Oral Daily Marquette Saa, MD      . metoprolol tartrate (LOPRESSOR) tablet 25 mg  25 mg Oral BID Marquette Saa, MD   25 mg at 08/30/14 2228   . morphine 2 MG/ML injection 1 mg  1 mg Intravenous Q2H PRN Yolande Jolly, MD   1 mg at 08/30/14 2259  . nitroGLYCERIN (NITROSTAT) SL tablet 0.4 mg  0.4 mg Sublingual Q5 min PRN Marquette Saa, MD      . ranolazine [facility]) 12 hr tablet 1,000 mg  1,000 mg Oral BID Marquette Saa, MD   1,000 mg at 08/30/14 2228  . traMADol (ULTRAM) tablet 50 mg  50 mg Oral BID Marquette Saa, MD   50 mg at 08/30/14 2303  . traZODone (DESYREL) tablet 50 mg  50 mg Oral QHS Marquette Saa, MD   50 mg at 08/30/14 2228     Allergies  Allergen Reactions  . Ibuprofen     "interacted with meds" per pt's family     Past Medical History  Diagnosis Date  . Coronary artery disease   . Hypertension   . Hypercholesterolemia   . COPD (chronic obstructive pulmonary disease)   . Type II diabetes mellitus   . Pneumonia "several times"  . GERD (gastroesophageal reflux disease)   . Migraine     "a few times/month" (08/30/2014)  . Arthritis     "hips" (08/30/2014)  . Chronic lower back pain   . Anxiety   . Depression   . NSTEMI (non-ST elevated myocardial infarction) 08/2013    [name] 09/17/2013    Past Surgical History  Procedure Laterality Date  . Tonsillectomy    . Percutaneous pinning phalanx fracture of hand Left     "plate & pins"  . Cardiac catheterization    . Coronary angioplasty      History reviewed. No pertinent family history.  Social History:  reports that she has been smoking Cigarettes.  She has a 15 pack-year smoking history. She has never used smokeless tobacco. She reports that she drinks alcohol. She reports that she uses illicit drugs (Marijuana).   Review of Systems: Constitutional:  denies fever, chills, diaphoresis, appetite change and fatigue.  HEENT: denies photophobia, eye pain, redness, hearing loss, ear pain, congestion, sore throat, rhinorrhea, sneezing, neck pain, neck stiffness and tinnitus.  Respiratory: denies SOB, DOE, cough,  chest tightness, and wheezing.  Cardiovascular: admits to chest pain,    Gastrointestinal: denies nausea, vomiting, abdominal pain, diarrhea, constipation, blood in stool.  Genitourinary: denies dysuria, urgency, frequency, hematuria, flank pain and difficulty urinating.  Musculoskeletal: denies  myalgias,  back pain, joint swelling, arthralgias and gait problem.   Skin: denies pallor, rash and wound.  Neurological: denies dizziness, seizures, syncope, weakness, light-headedness, numbness and headaches.   Hematological: denies adenopathy, easy bruising, personal or family bleeding history.  Psychiatric/ Behavioral: denies suicidal ideation, mood changes, confusion, nervousness, sleep disturbance and agitation.    Physical Exam: BP 102/57 mmHg  Pulse 51  Temp(Src) 98 F (36.7 C) (Oral)  Resp 20  Ht 5\' 4"  (1.626 m)  Wt 73.392 kg (161 lb 12.8 oz)  BMI 27.76 kg/m2  SpO2 98%  LMP 07/30/2014  Wt Readings from Last 3 Encounters:  08/31/14 73.392 kg (161 lb 12.8 oz)  06/05/14 71.668 kg (158 lb)  06/05/14 71.668 kg (158 lb)    General: Vital signs reviewed and noted. Well-developed, well-nourished, in no acute distress; alert,   Head: Normocephalic, atraumatic, sclera anicteric,   Neck: Supple. Negative for carotid bruits. No JVD   Lungs:  Clear bilaterally, no  wheezes, rales, or rhonchi. Breathing is normal   Heart: RRR with S1 S2. No murmurs, rubs, or gallops   Abdomen/ GI :  Soft, non-tender, non-distended with normoactive bowel sounds. No hepatomegaly. No rebound/guarding. No obvious abdominal masses   MSK: Strength and the appear normal for age.   Extremities: No clubbing or cyanosis. No edema.  Distal pedal pulses are 2+ and equal   Neurologic:  CN are grossly intact,  No obvious motor or sensory defect.  Alert and oriented X 3. Moves all extremities spontaneously.  Psych: Responds to questions appropriately with a normal affect.     Lab results: Basic Metabolic  Panel:  Recent Labs Lab 08/30/14 1329  NA 139  K 3.9  CL 107  CO2 21*  GLUCOSE 126*  BUN 8  CREATININE 0.76  CALCIUM 9.4    Liver Function Tests: No results for input(s): AST, ALT, ALKPHOS, BILITOT, PROT, ALBUMIN in the last 168 hours. No results for input(s): LIPASE, AMYLASE in the last 168 hours. No results for input(s): AMMONIA in the last 168 hours.  CBC:  Recent Labs Lab 08/30/14 1329 08/31/14 0441  WBC 7.9 9.3  HGB 14.4 13.0  HCT 41.8 38.3  MCV 95.9 96.7  PLT 166 142*    Cardiac Enzymes:  Recent Labs Lab 08/30/14 1329 08/30/14 1928 08/30/14 2327 08/31/14 0114  TROPONINI <0.03 <0.03 <0.03 <0.03    BNP: Invalid input(s): POCBNP  CBG:  Recent Labs Lab 08/30/14 2136  GLUCAP 114*    Coagulation Studies: No results for input(s): LABPROT, INR in the last 72 hours.   Other results: Personal review of EKG shows :  Sinus bradycardia at 54. She has nonspecific ST and T wave changes.  Imaging: Dg Chest Portable 1 View  08/30/2014   CLINICAL DATA:  Chest pain today. History of myocardial infarction, diabetes, hypertension and smoking. Initial encounter.  EXAM: PORTABLE CHEST - 1 VIEW  COMPARISON:  None.  FINDINGS: 1334 hour. The heart size and mediastinal contours are normal. There is faint retrocardiac opacity which may reflect atelectasis or an early infiltrate. The right lung appears clear. There is no edema, pleural effusion or pneumothorax. No osseous abnormalities apparent.  IMPRESSION: Retrocardiac atelectasis or early infiltrate. No edema or significant pleural effusion.   Electronically Signed   By: Carey Bullocks M.D.   On: 08/30/2014 13:58        Assessment & Plan:  1. Unstable angina: Patient presents with symptoms consistent with unstable angina. She has known coronary artery disease and has had  a cardiac catheter station in January, 2016 by Dr. Welton Flakes in [place].  She is continued to smoke. She also has a history of diabetes. I think  that she needs to be referred for repeat cardiac catheterization. There was some thought previously that she may need coronary artery bypass grafting. I've reviewed the angiograms and do not think that stenting would be out of the question pinning on results of the repeat cath.  I discussed the risks, benefits, and options regarding cardiac catheterization P she understands and agrees to proceed.  We'll keep her nothing by mouth for anticipated cardiac cath later this morning. I've advised her to stop smoking       Vesta Mixer, Montez Hageman., MD, [facility] 08/31/2014, 8:19 AM Office - [phone] Pager [phone]

## 2014-08-31 NOTE — Discharge Instructions (Signed)
You were admitted for chest pain.  You have plaques in the arteries around your heart that are slightly better than on your last catheterization.  You should stop smoking.  You can call the [facility] Quitline 1-800-QUITNOW for resources and guidance.  F/u with your primary care physician and your cardiologist after discharge.  Restart all of your home medications.  Wait 2 days to start your Metformin.   Coronary Artery Disease Coronary artery disease (CAD) is a process in which the heart (coronary) arteries narrow or become blocked from the development of atherosclerosis. Atherosclerosis is a disease in which plaque builds up on the inside of the heart arteries (coronary arteries). Plaque is made up of fats (lipids), cholesterol, calcium, and fibrous tissue. CAD can lead to a heart attack (myocardial infarction, MI). An MI can lead to heart failure, cardiogenic shock, or sudden cardiac death. CAD can cause an MI through:  Plaque buildup that can severely narrow or block the coronary arteries and diminish blood flow.  Plaque that can become unstable and "rupture." Unstable plaque that ruptures within a coronary artery can form a clot and cause a sudden (acute) blockage. RISK FACTORS Many risk factors contribute to the development of CAD. These include:  High cholesterol (dyslipidemia) levels.  High blood pressure (hypertension).  Smoking.  Diabetes.  Age.  Gender. Men can develop CAD earlier in life than women.  Family history.  Inactivity or lack of regular physical or aerobic exercise.  A diet high in saturated fats.  Chronic kidney disease. SYMPTOMS  When a coronary artery is narrowed or blocked, an MI can occur. MI symptoms can include:  Chest pain (agina). Angina can occur by itself or it can also occur with pain in the neck, arm, jaw, or in the upper, middle back (mid-scapular pain).  Profuse sweating (diaphoresis) without physical activity or movement.  Shortness of breath  (dyspnea).  Irregular heartbeats (palpitations) that feel like your heart is skipping beats or is beating very fast.  Nausea.  Epigastric pain. Epigastric pain may occur as "heartburn."  Tiredness (malaise). This can especially be present in the elderly. Women can have different (atypical) symptoms other than classic angina.  DIAGNOSIS  The diagnosis of CAD may include:  An electrocardiography (ECG). An ECG does not diagnose CAD, but it is usefull in the detection of a sudden (acute) MI or as a marker for a previous MI. Depending on which heart (coronary) artery may be blocked, an ECG may not pick up an MI pattern.  Exercise stress test. A stress test can be performed at rest for people who are unable to do an exercise stress test. A stress test will only be abnormal if one or more of the large coronary arteries is significantly blocked.  Blood tests. Tests may include samples to detect heart muscle damage (such as troponin levels). Other tests may include cholesterol checks and an inflammation test (high-sensitivity C-reactive protein, hs-CRP).  Coronary angiography.  Screening people who have peripheral vascular disease (PAD). These people often times have CAD. TREATMENT  The treatment of CAD includes the following:  Lifestyle changes such as:  Following a heart-healthy diet. A registered dietitian can you help educate you on healthy food options and changes.  Quiting smoking.  Following an exercise program approved by your caregiver.  Maintaining a healthy weight. Lose weight as approved by your caregiver.  Medicines to help control your blood pressure, cholesterol level, angina, and blood clotting. Medicines may include beta-blockers, ACE inhibitors, statins, nitrates, and anti-platelet  medicines.  If you have a heart stent and are taking anti-platelet medicine, it is important to not suddenly stop taking this medicine. Suddenly stopping anti-platelet medicine can result in  an MI. Talk with your caregiver before stopping medicine or if you cannot afford your medicine.  If the coronary arteries are significantly blocked, surgery may be needed. This can include:  Percutaneous coronary intervention (PCI) with or without stent placement.  Coronary artery bypass graft surgery (CABG). SEEK IMMEDIATE MEDICAL CARE IF:   You develop MI symptoms. This is a medical emergency. Get help at once. Call your local emergency service (911 in the [place] immediately. Do not drive yourself to the clinic or hospital. MI symptoms can include:  Angina or pain that occurs in the neck, arm, jaw, or in the upper middle back.  Profuse sweating without cause.  Shortness of breath or difficulty breathing without cause.  Unexplained nausea or epigastric pain that feels like heartburn. Document Released: 03/31/2011 Document Reviewed: 05/10/2013 [facility] Patient Information 2015 [place], [place]. This information is not intended to replace advice given to you by your health care provider. Make sure you discuss any questions you have with your health care provider.

## 2014-08-31 NOTE — Progress Notes (Signed)
ANTICOAGULATION CONSULT NOTE - Follow-up Consult  Pharmacy Consult for heparin Indication: chest pain/ACS  Allergies  Allergen Reactions  . Ibuprofen     "interacted with meds" per pt's family    Patient Measurements: Height: 5\' 4"  (162.6 cm) Weight: 161 lb (73.029 kg) IBW/kg (Calculated) : 54.7 Heparin Dosing Weight: 70kg  Vital Signs: Temp: 98 F (36.7 C) (08/11 0002) Temp Source: Oral (08/11 0002) BP: 98/54 mmHg (08/11 0220) Pulse Rate: 47 (08/11 0220)  Labs:  Recent Labs  08/30/14 1329 08/30/14 1928 08/30/14 2146 08/30/14 2327 08/31/14 0114 08/31/14 0441 08/31/14 0450  HGB 14.4  --   --   --   --  13.0  --   HCT 41.8  --   --   --   --  38.3  --   PLT 166  --   --   --   --  142*  --   HEPARINUNFRC  --   --  0.13*  --   --   --  0.37  CREATININE 0.76  --   --   --   --   --   --   TROPONINI <0.03 <0.03  --  <0.03 <0.03  --   --     Estimated Creatinine Clearance: 89.7 mL/min (by C-G formula based on Cr of 0.76).  Assessment: 43 yof on heparin for r/o ACS. Heparin level therapeutic on 1100 units/hr. No bleeding noted.  Goal of Therapy:  Heparin level 0.3-0.7 units/ml Monitor platelets by anticoagulation protocol: Yes   Plan:  Continue heparin at 1100 units/hr F/u 6 hr heparin level to confirm therapeutic Daily heparin level and CBC  Christoper Fabian, PharmD, BCPS Clinical pharmacist, pager [phone] 08/31/2014,5:41 AM

## 2014-08-31 NOTE — Progress Notes (Signed)
Dr. Kennon Rounds at bedside, patient started complaining of 7/10 chest pain. Orders to give Morphine and get EKG. Will hold off on weaning Nitro gtt until chest pain resolved and trop has been resulted.

## 2014-08-31 NOTE — Progress Notes (Signed)
Order to D/C Nitro gtt. Spoke with Dr. Kennon Rounds to clarify, received order to wean and d/c Nitro gtt.

## 2014-08-31 NOTE — Progress Notes (Signed)
FPTS Interim Progress Note  S: PAtient feeling well. No further CP or SOB. Ready for d/c.  O: BP 125/74 mmHg  Pulse 56  Temp(Src) 98 F (36.7 C) (Oral)  Resp 17  Ht 5\' 4"  (1.626 m)  Wt 161 lb 12.8 oz (73.392 kg)  BMI 27.76 kg/m2  SpO2 98%  LMP 07/30/2014  Gen: NAD, talkative HEENT: NCAT, MMM CV: RRR, no m/r/g, intact distal pulses Pulm: slight end expiratorywheeze, normal WOB Abd: Soft, NTND Ext: WWP, no edema   A/P: Krista Alvarez is a 44 y.o. female p/w CP, now resolved, s/p cardiac cath with mild to moderate 3 vessel CAD. - Spoke to Cardiology, ok for d/c - Continue medical management of CAD - f/u with cardiologist in [facility] after discharge - discharge home at this time   Erasmo Downer, MD 08/31/2014, 3:02 PM PGY-2, [facility] Family Medicine Service pager [phone]

## 2014-08-31 NOTE — Progress Notes (Addendum)
Spoke with Dr Mayford Knife from cardiology overnight regarding chest pain and EKG changes with negative troponins. Dr Mayford Knife felt the EKG changes where unlikely clinically significant but would benefit from cardiology consult in the AM for consideration of further testing   After being given morphine for chest pain, pt fell asleep and has not have recurrence of chest pain. Nitro drip was weaned after chest pain ceased, troponns were negative and EKG thought not to be of acute concern by cardiology Will plan to consult cardiology this AM  Krista Alvarez A. Kennon Rounds MD, MS Family Medicine Resident PGY-2 Pager [phone]

## 2014-08-31 NOTE — Progress Notes (Signed)
Notified Dr. Kennon Rounds that HR in the 50s and as low as 48. Ok to hold evening Lopressor.

## 2014-08-31 NOTE — H&P (View-Only) (Signed)
CONSULT NOTE  Date: 08/31/2014               Patient Name:  Krista Alvarez MRN: [id number]  DOB: 03-Jan-1971 Age / Sex: 44 y.o., female        PCP: Margaretann Loveless Primary Cardiologist: Johny Blamer             Referring Physician: Gwendolyn Grant               Reason for Consult: Chest pain , hx of CAD            History of Present Illness: Patient is a 44 y.o. female with a PMHx of moderate diffuse coronary artery disease, COPD, diabetes mellitus type 2, ongoing cigarette smoking who was admitted to [facility] on 08/30/2014 for evaluation of chest discomfort and abdominal pain..   She started having some left-sided stabbing chest pain. It radiated to her left shoulder. It was not relieved with nitroglycerin. This was preceded by some nausea and dry heaving. She's also had a cough and shortness of breath for the past couple days.  The pain is very similar to her previous MI pain last year.  I personally reviewed the cardiac cath from 2015. She has a 60-70 % distal LAD stenosis and mild moderate disease in the RCA  She has CP frequently .  She takes SL NTG only when the CP is severe.     Medications: Outpatient medications: Prescriptions prior to admission  Medication Sig Dispense Refill Last Dose  . aspirin EC 81 MG tablet Take 81 mg by mouth daily.   08/30/2014 at Unknown time  . atorvastatin (LIPITOR) 80 MG tablet Take 80 mg by mouth daily.   08/30/2014 at Unknown time  . clopidogrel (PLAVIX) 75 MG tablet Take 75 mg by mouth daily.   08/30/2014 at Unknown time  . gabapentin (NEURONTIN) 300 MG capsule Take 300 mg by mouth 2 (two) times daily.   08/30/2014 at Unknown time  . Ipratropium-Albuterol (COMBIVENT RESPIMAT) 20-100 MCG/ACT AERS respimat Inhale 1 puff into the lungs every 6 (six) hours.   08/30/2014 at Unknown time  . isosorbide mononitrate (IMDUR) 60 MG 24 hr tablet Take 60 mg by mouth daily.   08/30/2014 at Unknown time  . metFORMIN (GLUCOPHAGE) 500 MG tablet Take 500 mg by  mouth 2 (two) times daily with a meal.   08/30/2014 at Unknown time  . metoprolol tartrate (LOPRESSOR) 25 MG tablet Take 25 mg by mouth 2 (two) times daily.   08/30/2014 at 900  . nitroGLYCERIN (NITROSTAT) 0.4 MG SL tablet Place 0.4 mg under the tongue every 5 (five) minutes as needed for chest pain.   08/30/2014 at Unknown time  . ranolazine (RANEXA) 1000 MG SR tablet Take 1,000 mg by mouth 2 (two) times daily.   08/30/2014 at Unknown time  . traMADol (ULTRAM) 50 MG tablet Take 50 mg by mouth 2 (two) times daily.   08/30/2014 at Unknown time  . traZODone (DESYREL) 50 MG tablet Take 50 mg by mouth at bedtime.   08/29/2014 at Unknown time    Current medications: Current Facility-Administered Medications  Medication Dose Route Frequency Provider Last Rate Last Dose  . acetaminophen (TYLENOL) tablet 650 mg  650 mg Oral Q6H PRN Bonney Aid, MD   650 mg at 08/31/14 0430  . aspirin EC tablet 81 mg  81 mg Oral Daily Marquette Saa, MD      . atorvastatin (LIPITOR) tablet 80 mg  80 mg Oral Daily Marquette Saa, MD   80 mg at 08/30/14 2227  . clopidogrel (PLAVIX) tablet 75 mg  75 mg Oral Daily Marquette Saa, MD   75 mg at 08/30/14 1928  . gabapentin (NEURONTIN) capsule 300 mg  300 mg Oral BID Marquette Saa, MD   300 mg at 08/30/14 2228  . gi cocktail (Maalox,Lidocaine,Donnatal)  30 mL Oral Once Bonney Aid, MD   Stopped at 08/30/14 2315  . heparin ADULT infusion 100 units/mL (25000 units/250 mL)  1,100 Units/hr Intravenous Continuous Marquita Palms, RPH 11 mL/hr at 08/31/14 0220 2,000 Units at 08/31/14 0220  . ipratropium-albuterol (DUONEB) 0.5-2.5 (3) MG/3ML nebulizer solution 3 mL  3 mL Nebulization Q6H Tobey Grim, MD   3 mL at 08/30/14 1937  . isosorbide mononitrate (IMDUR) 24 hr tablet 60 mg  60 mg Oral Daily Marquette Saa, MD      . metoprolol tartrate (LOPRESSOR) tablet 25 mg  25 mg Oral BID Marquette Saa, MD   25 mg at 08/30/14 2228   . morphine 2 MG/ML injection 1 mg  1 mg Intravenous Q2H PRN Yolande Jolly, MD   1 mg at 08/30/14 2259  . nitroGLYCERIN (NITROSTAT) SL tablet 0.4 mg  0.4 mg Sublingual Q5 min PRN Marquette Saa, MD      . ranolazine [facility]) 12 hr tablet 1,000 mg  1,000 mg Oral BID Marquette Saa, MD   1,000 mg at 08/30/14 2228  . traMADol (ULTRAM) tablet 50 mg  50 mg Oral BID Marquette Saa, MD   50 mg at 08/30/14 2303  . traZODone (DESYREL) tablet 50 mg  50 mg Oral QHS Marquette Saa, MD   50 mg at 08/30/14 2228     Allergies  Allergen Reactions  . Ibuprofen     "interacted with meds" per pt's family     Past Medical History  Diagnosis Date  . Coronary artery disease   . Hypertension   . Hypercholesterolemia   . COPD (chronic obstructive pulmonary disease)   . Type II diabetes mellitus   . Pneumonia "several times"  . GERD (gastroesophageal reflux disease)   . Migraine     "a few times/month" (08/30/2014)  . Arthritis     "hips" (08/30/2014)  . Chronic lower back pain   . Anxiety   . Depression   . NSTEMI (non-ST elevated myocardial infarction) 08/2013    [name] 09/17/2013    Past Surgical History  Procedure Laterality Date  . Tonsillectomy    . Percutaneous pinning phalanx fracture of hand Left     "plate & pins"  . Cardiac catheterization    . Coronary angioplasty      History reviewed. No pertinent family history.  Social History:  reports that she has been smoking Cigarettes.  She has a 15 pack-year smoking history. She has never used smokeless tobacco. She reports that she drinks alcohol. She reports that she uses illicit drugs (Marijuana).   Review of Systems: Constitutional:  denies fever, chills, diaphoresis, appetite change and fatigue.  HEENT: denies photophobia, eye pain, redness, hearing loss, ear pain, congestion, sore throat, rhinorrhea, sneezing, neck pain, neck stiffness and tinnitus.  Respiratory: denies SOB, DOE, cough,  chest tightness, and wheezing.  Cardiovascular: admits to chest pain,    Gastrointestinal: denies nausea, vomiting, abdominal pain, diarrhea, constipation, blood in stool.  Genitourinary: denies dysuria, urgency, frequency, hematuria, flank pain and difficulty urinating.  Musculoskeletal: denies  myalgias,  back pain, joint swelling, arthralgias and gait problem.   Skin: denies pallor, rash and wound.  Neurological: denies dizziness, seizures, syncope, weakness, light-headedness, numbness and headaches.   Hematological: denies adenopathy, easy bruising, personal or family bleeding history.  Psychiatric/ Behavioral: denies suicidal ideation, mood changes, confusion, nervousness, sleep disturbance and agitation.    Physical Exam: BP 102/57 mmHg  Pulse 51  Temp(Src) 98 F (36.7 C) (Oral)  Resp 20  Ht 5\' 4"  (1.626 m)  Wt 73.392 kg (161 lb 12.8 oz)  BMI 27.76 kg/m2  SpO2 98%  LMP 07/30/2014  Wt Readings from Last 3 Encounters:  08/31/14 73.392 kg (161 lb 12.8 oz)  06/05/14 71.668 kg (158 lb)  06/05/14 71.668 kg (158 lb)    General: Vital signs reviewed and noted. Well-developed, well-nourished, in no acute distress; alert,   Head: Normocephalic, atraumatic, sclera anicteric,   Neck: Supple. Negative for carotid bruits. No JVD   Lungs:  Clear bilaterally, no  wheezes, rales, or rhonchi. Breathing is normal   Heart: RRR with S1 S2. No murmurs, rubs, or gallops   Abdomen/ GI :  Soft, non-tender, non-distended with normoactive bowel sounds. No hepatomegaly. No rebound/guarding. No obvious abdominal masses   MSK: Strength and the appear normal for age.   Extremities: No clubbing or cyanosis. No edema.  Distal pedal pulses are 2+ and equal   Neurologic:  CN are grossly intact,  No obvious motor or sensory defect.  Alert and oriented X 3. Moves all extremities spontaneously.  Psych: Responds to questions appropriately with a normal affect.     Lab results: Basic Metabolic  Panel:  Recent Labs Lab 08/30/14 1329  NA 139  K 3.9  CL 107  CO2 21*  GLUCOSE 126*  BUN 8  CREATININE 0.76  CALCIUM 9.4    Liver Function Tests: No results for input(s): AST, ALT, ALKPHOS, BILITOT, PROT, ALBUMIN in the last 168 hours. No results for input(s): LIPASE, AMYLASE in the last 168 hours. No results for input(s): AMMONIA in the last 168 hours.  CBC:  Recent Labs Lab 08/30/14 1329 08/31/14 0441  WBC 7.9 9.3  HGB 14.4 13.0  HCT 41.8 38.3  MCV 95.9 96.7  PLT 166 142*    Cardiac Enzymes:  Recent Labs Lab 08/30/14 1329 08/30/14 1928 08/30/14 2327 08/31/14 0114  TROPONINI <0.03 <0.03 <0.03 <0.03    BNP: Invalid input(s): POCBNP  CBG:  Recent Labs Lab 08/30/14 2136  GLUCAP 114*    Coagulation Studies: No results for input(s): LABPROT, INR in the last 72 hours.   Other results: Personal review of EKG shows :  Sinus bradycardia at 54. She has nonspecific ST and T wave changes.  Imaging: Dg Chest Portable 1 View  08/30/2014   CLINICAL DATA:  Chest pain today. History of myocardial infarction, diabetes, hypertension and smoking. Initial encounter.  EXAM: PORTABLE CHEST - 1 VIEW  COMPARISON:  None.  FINDINGS: 1334 hour. The heart size and mediastinal contours are normal. There is faint retrocardiac opacity which may reflect atelectasis or an early infiltrate. The right lung appears clear. There is no edema, pleural effusion or pneumothorax. No osseous abnormalities apparent.  IMPRESSION: Retrocardiac atelectasis or early infiltrate. No edema or significant pleural effusion.   Electronically Signed   By: Carey Bullocks M.D.   On: 08/30/2014 13:58        Assessment & Plan:  1. Unstable angina: Patient presents with symptoms consistent with unstable angina. She has known coronary artery disease and has had  a cardiac catheter station in January, 2016 by Dr. Welton Flakes in [place].  She is continued to smoke. She also has a history of diabetes. I think  that she needs to be referred for repeat cardiac catheterization. There was some thought previously that she may need coronary artery bypass grafting. I've reviewed the angiograms and do not think that stenting would be out of the question pinning on results of the repeat cath.  I discussed the risks, benefits, and options regarding cardiac catheterization P she understands and agrees to proceed.  We'll keep her nothing by mouth for anticipated cardiac cath later this morning. I've advised her to stop smoking       Vesta Mixer, Montez Hageman., MD, [facility] 08/31/2014, 8:19 AM Office - [phone] Pager [phone]

## 2014-08-31 NOTE — Progress Notes (Signed)
UR Completed Alka Falwell Graves-Bigelow, RN,BSN [phone]  

## 2014-09-01 LAB — HEMOGLOBIN A1C
Hgb A1c MFr Bld: 6.3 % — ABNORMAL HIGH (ref 4.8–5.6)
Mean Plasma Glucose: 134 mg/dL

## 2014-09-01 MED FILL — Lidocaine HCl Local Preservative Free (PF) Inj 1%: INTRAMUSCULAR | Qty: 2 | Status: AC

## 2014-09-04 NOTE — Discharge Summary (Signed)
Family Medicine Teaching [facility] Discharge Summary  Patient name: Krista Alvarez Medical record number: [id number] Date of birth: 08/03/1970 Age: 44 y.o. Gender: female Date of Admission: 08/30/2014  Date of Discharge: 08/31/14 Admitting Physician: Tobey Grim, MD  Primary Care Provider: Margaretann Loveless, MD Consultants: Cardiology  Indication for Hospitalization: chest pain  Discharge Diagnoses/Problem List:  Patient Active Problem List   Diagnosis Date Noted  . Coronary artery disease involving native coronary artery of native heart with angina pectoris   . Type 2 diabetes mellitus without complication   . Tobacco abuse   . COLD (chronic obstructive lung disease)   . Pain in the chest   . Chest pain 08/30/2014    Disposition: home  Discharge Condition: stable  Discharge Exam:  General: lying in bed in NAD Cardiovascular: RRR, no murmurs appreciated, minimal LE edema L foot to ankle Respiratory: expiratory wheeze, no crackles, non-labored Abdomen: soft, non-tender, non-distended, +BS Neuro: A&Ox3, no focal deficits Psych: appropriate mood and affect   Brief Hospital Course:  Krista Alvarez prested after 1 day of chest pain. ACS rule out was started and pain was initially relieved by nitro and heparin drips in ED, but pain returned. Cardiology was consulted and thought pt needed cardiac catheterization.   Pt received cath while inpatient, which showed mild to moderate three vessel CAD. Plans were made for continued medical CAD treatment until outpatient cardiology follow-up.   Issues for Follow Up:  1. Given repeated chest pain, patient received cardiac cath. Cath showed mild to moderate three vessel CAD.   Significant Procedures: Cardiac catheterization (08/31/14)  Significant Labs and Imaging:  BNP 51  Recent Labs Lab 08/30/14 1329 08/31/14 0441  WBC 7.9 9.3  HGB 14.4 13.0  HCT 41.8 38.3  PLT 166 142*    Recent Labs Lab 08/30/14 1329 08/31/14 0956   NA 139 137  K 3.9 3.9  CL 107 106  CO2 21* 23  GLUCOSE 126* 139*  BUN 8 8  CREATININE 0.76 0.65  CALCIUM 9.4 8.8*    Results/Tests Pending at Time of Discharge: None  Discharge Medications:    Medication List    TAKE these medications        aspirin EC 81 MG tablet  Take 81 mg by mouth daily.     atorvastatin 80 MG tablet  Commonly known as:  LIPITOR  Take 80 mg by mouth daily.     clopidogrel 75 MG tablet  Commonly known as:  PLAVIX  Take 75 mg by mouth daily.     COMBIVENT RESPIMAT 20-100 MCG/ACT Aers respimat  Generic drug:  Ipratropium-Albuterol  Inhale 1 puff into the lungs every 6 (six) hours.     gabapentin 300 MG capsule  Commonly known as:  NEURONTIN  Take 300 mg by mouth 2 (two) times daily.     isosorbide mononitrate 60 MG 24 hr tablet  Commonly known as:  IMDUR  Take 60 mg by mouth daily.     metFORMIN 500 MG tablet  Commonly known as:  GLUCOPHAGE  Take 500 mg by mouth 2 (two) times daily with a meal.     metoprolol tartrate 25 MG tablet  Commonly known as:  LOPRESSOR  Take 25 mg by mouth 2 (two) times daily.     nitroGLYCERIN 0.4 MG SL tablet  Commonly known as:  NITROSTAT  Place 0.4 mg under the tongue every 5 (five) minutes as needed for chest pain.     RANEXA 1000 MG SR tablet  Generic drug:  ranolazine  Take 1,000 mg by mouth 2 (two) times daily.     traMADol 50 MG tablet  Commonly known as:  ULTRAM  Take 50 mg by mouth 2 (two) times daily.     traZODone 50 MG tablet  Commonly known as:  DESYREL  Take 50 mg by mouth at bedtime.        Discharge Instructions: Please refer to Patient Instructions section of EMR for full details.  Patient was counseled important signs and symptoms that should prompt return to medical care, changes in medications, dietary instructions, activity restrictions, and follow up appointments.   Follow-Up Appointments: Follow-up Information    Follow up with Margaretann Loveless, MD. Schedule an appointment as  soon as possible for a visit in 1 week.   Specialty:  Internal Medicine   Why:  For hospital follow-up   Contact information:   [address] [place] [address] [phone]       Follow up with your cardiologist. Schedule an appointment as soon as possible for a visit in 2 weeks.   Why:  For hospital follow-up      Marquette Saa, MD 09/04/2014, 2:08 PM PGY-1, [facility] Family Medicine

## 2015-03-19 ENCOUNTER — Encounter: Payer: Self-pay | Admitting: Emergency Medicine

## 2015-03-19 ENCOUNTER — Emergency Department
Admission: EM | Admit: 2015-03-19 | Discharge: 2015-03-19 | Disposition: A | Discharge status: Home / Self Care | Payer: BLUE CROSS/BLUE SHIELD | Attending: Emergency Medicine | Admitting: Emergency Medicine

## 2015-03-19 ENCOUNTER — Emergency Department: Payer: BLUE CROSS/BLUE SHIELD

## 2015-03-19 DIAGNOSIS — I10 Essential (primary) hypertension: Secondary | ICD-10-CM | POA: Insufficient documentation

## 2015-03-19 DIAGNOSIS — Z7902 Long term (current) use of antithrombotics/antiplatelets: Secondary | ICD-10-CM | POA: Insufficient documentation

## 2015-03-19 DIAGNOSIS — F17210 Nicotine dependence, cigarettes, uncomplicated: Secondary | ICD-10-CM | POA: Insufficient documentation

## 2015-03-19 DIAGNOSIS — J449 Chronic obstructive pulmonary disease, unspecified: Secondary | ICD-10-CM | POA: Insufficient documentation

## 2015-03-19 DIAGNOSIS — R0789 Other chest pain: Secondary | ICD-10-CM

## 2015-03-19 DIAGNOSIS — E119 Type 2 diabetes mellitus without complications: Secondary | ICD-10-CM | POA: Insufficient documentation

## 2015-03-19 DIAGNOSIS — Z79899 Other long term (current) drug therapy: Secondary | ICD-10-CM | POA: Insufficient documentation

## 2015-03-19 DIAGNOSIS — R079 Chest pain, unspecified: Secondary | ICD-10-CM | POA: Diagnosis present

## 2015-03-19 DIAGNOSIS — I252 Old myocardial infarction: Secondary | ICD-10-CM | POA: Insufficient documentation

## 2015-03-19 DIAGNOSIS — Z7984 Long term (current) use of oral hypoglycemic drugs: Secondary | ICD-10-CM | POA: Diagnosis not present

## 2015-03-19 DIAGNOSIS — Z7982 Long term (current) use of aspirin: Secondary | ICD-10-CM | POA: Diagnosis not present

## 2015-03-19 LAB — COMPREHENSIVE METABOLIC PANEL
ALT: 16 U/L (ref 14–54)
AST: 13 U/L — ABNORMAL LOW (ref 15–41)
Albumin: 4.2 g/dL (ref 3.5–5.0)
Alkaline Phosphatase: 53 U/L (ref 38–126)
Anion gap: 8 (ref 5–15)
BUN: 12 mg/dL (ref 6–20)
CO2: 23 mmol/L (ref 22–32)
Calcium: 9.2 mg/dL (ref 8.9–10.3)
Chloride: 105 mmol/L (ref 101–111)
Creatinine, Ser: 0.65 mg/dL (ref 0.44–1.00)
GFR calc Af Amer: >60 mL/min (ref >60)
Glucose, Bld: 131 mg/dL — ABNORMAL HIGH (ref 65–99)
POTASSIUM: 3.6 mmol/L (ref 3.5–5.1)
SODIUM: 136 mmol/L (ref 135–145)
Total Bilirubin: 0.9 mg/dL (ref 0.3–1.2)
Total Protein: 7.4 g/dL (ref 6.5–8.1)

## 2015-03-19 LAB — CBC
HCT: 41.5 % (ref 35.0–47.0)
Hemoglobin: 14.4 g/dL (ref 12.0–16.0)
MCH: 32.4 pg (ref 26.0–34.0)
MCHC: 34.8 g/dL (ref 32.0–36.0)
MCV: 93 fL (ref 80.0–100.0)
PLATELETS: 181 10*3/uL (ref 150–440)
RBC: 4.46 MIL/uL (ref 3.80–5.20)
RDW: 15.3 % — AB (ref 11.5–14.5)
WBC: 8.5 10*3/uL (ref 3.6–11.0)

## 2015-03-19 LAB — TROPONIN I

## 2015-03-19 MED ORDER — DIAZEPAM 5 MG PO TABS
ORAL_TABLET | ORAL | Status: AC
  Administered 2015-03-19: 5 mg via ORAL
  Filled 2015-03-19: qty 1

## 2015-03-19 MED ORDER — OXYCODONE-ACETAMINOPHEN 5-325 MG PO TABS
1.0000 | ORAL_TABLET | Freq: Once | ORAL | Status: AC
  Administered 2015-03-19: 1 via ORAL
  Filled 2015-03-19: qty 1

## 2015-03-19 MED ORDER — DIAZEPAM 5 MG PO TABS
5.0000 mg | ORAL_TABLET | Freq: Once | ORAL | Status: AC
  Administered 2015-03-19: 5 mg via ORAL

## 2015-03-19 MED ORDER — DIAZEPAM 5 MG/ML IJ SOLN: 2.5000 mg | Freq: Once | INTRAMUSCULAR | Status: DC

## 2015-03-19 MED ORDER — DIAZEPAM 5 MG/ML IJ SOLN
5.0000 mg | Freq: Once | INTRAMUSCULAR | Status: DC
  Filled 2015-03-19: qty 2

## 2015-03-19 NOTE — Discharge Instructions (Signed)

## 2015-03-19 NOTE — ED Provider Notes (Signed)
[facility] Emergency Department Provider Note  ____________________________________________  Time seen: Approximately 10 AM  I have reviewed the triage vital signs and the nursing notes.   HISTORY  Chief Complaint Chest Pain    HPI Krista Alvarez is a 45 y.o. female with a history of coronary artery disease who is presenting today with chest pain. She says that her pain is a 10 out of 10 and began this morning at 4 AM. She said she started with a cough yesterday and then upon forceful coughing this morning the pain became acutely worse. She describes the pain is across the front of her chest and radiating through to her back. It is worsened with movement as well as with palpation. It is not worsened with exertion. It also worsens with coughing.   Past Medical History  Diagnosis Date  . Coronary artery disease   . Hypertension   . Hypercholesterolemia   . COPD (chronic obstructive pulmonary disease) (HCC)   . Type II diabetes mellitus (HCC)   . Pneumonia "several times"  . GERD (gastroesophageal reflux disease)   . Migraine     "a few times/month" (08/30/2014)  . Arthritis     "hips" (08/30/2014)  . Chronic lower back pain   . Anxiety   . Depression   . NSTEMI (non-ST elevated myocardial infarction) (HCC) 08/2013    [name] 09/17/2013    Patient Active Problem List   Diagnosis Date Noted  . Coronary artery disease involving native coronary artery of native heart with angina pectoris (HCC)   . Type 2 diabetes mellitus without complication (HCC)   . Tobacco abuse   . COLD (chronic obstructive lung disease) (HCC)   . Pain in the chest   . Chest pain 08/30/2014    Past Surgical History  Procedure Laterality Date  . Tonsillectomy    . Percutaneous pinning phalanx fracture of hand Left     "plate & pins"  . Cardiac catheterization    . Coronary angioplasty    . Cardiac catheterization N/A 08/31/2014    Procedure: Left Heart Cath and Coronary  Angiography;  Surgeon: Kathleene Hazel, MD;  Location: [facility] INVASIVE CV LAB;  Service: Cardiovascular;  Laterality: N/A;    Current Outpatient Rx  Name  Route  Sig  Dispense  Refill  . aspirin EC 81 MG tablet   Oral   Take 81 mg by mouth daily.         [name] atorvastatin (LIPITOR) 80 MG tablet   Oral   Take 80 mg by mouth daily.         . clopidogrel (PLAVIX) 75 MG tablet   Oral   Take 75 mg by mouth daily.         [name] gabapentin (NEURONTIN) 300 MG capsule   Oral   Take 300 mg by mouth 2 (two) times daily.         . Ipratropium-Albuterol (COMBIVENT RESPIMAT) 20-100 MCG/ACT AERS respimat   Inhalation   Inhale 1 puff into the lungs 4 (four) times daily.          . isosorbide mononitrate (IMDUR) 60 MG 24 hr tablet   Oral   Take 60 mg by mouth daily.         . metFORMIN (GLUCOPHAGE) 500 MG tablet   Oral   Take 500 mg by mouth 2 (two) times daily with a meal.         . metoprolol tartrate (LOPRESSOR) 25 MG tablet  Oral   Take 25 mg by mouth 2 (two) times daily.         . nitroGLYCERIN (NITROSTAT) 0.4 MG SL tablet   Sublingual   Place 0.4 mg under the tongue every 5 (five) minutes as needed for chest pain.         . ranolazine (RANEXA) 1000 MG SR tablet   Oral   Take 1,000 mg by mouth 2 (two) times daily.         . traZODone (DESYREL) 50 MG tablet   Oral   Take 50 mg by mouth at bedtime.         . traMADol (ULTRAM) 50 MG tablet   Oral   Take 50 mg by mouth 2 (two) times daily.           Allergies Ibuprofen  No family history on file.  Social History Social History  Substance Use Topics  . Smoking status: Current Every Day Smoker -- 1.00 packs/day for 30 years    Types: Cigarettes  . Smokeless tobacco: Never Used  . Alcohol Use: Yes     Comment: 08/30/2014 "I drink a couple times/month"    Review of Systems Constitutional: No fever/chills Eyes: No visual changes. ENT: No sore throat. Cardiovascular: As above Respiratory:  Cough Gastrointestinal: No abdominal pain.  No nausea, no vomiting.  No diarrhea.  No constipation. Genitourinary: Negative for dysuria. Musculoskeletal: Negative for back pain. Skin: Negative for rash. Neurological: Negative for headaches, focal weakness or numbness.  10-point ROS otherwise negative.  ____________________________________________   PHYSICAL EXAM:  VITAL SIGNS: ED Triage Vitals  Enc Vitals Group     BP 03/19/15 0926 130/88 mmHg     Pulse Rate 03/19/15 0926 87     Resp 03/19/15 0926 18     Temp 03/19/15 0926 98.5 F (36.9 C)     Temp Source 03/19/15 0926 Oral     SpO2 03/19/15 0926 100 %     Weight 03/19/15 0926 150 lb (68.04 kg)     Height 03/19/15 0926 5\' 4"  (1.626 m)     Head Cir --      Peak Flow --      Pain Score 03/19/15 0929 9     Pain Loc --      Pain Edu? --      Excl. in GC? --     Constitutional: Alert and oriented. Well appearing and in no acute distress. Eyes: Conjunctivae are normal. PERRL. EOMI. Head: Atraumatic. Nose: No congestion/rhinnorhea. Mouth/Throat: Mucous membranes are moist.  Oropharynx non-erythematous. Neck: No stridor.   Cardiovascular: Normal rate, regular rhythm. Grossly normal heart sounds.  Good peripheral circulation. Chest pain is reproducible to palpation. Also back pain is reproducible to palpation to the left thoracic region. No step-off or deformity to the spine. Bilateral knee equal radial as well as dorsalis pedis pulses. Respiratory: Normal respiratory effort.  No retractions. Lungs CTAB. Gastrointestinal: Soft and nontender. No distention.  Musculoskeletal: No lower extremity tenderness nor edema.  No joint effusions. Neurologic:  Normal speech and language. No gross focal neurologic deficits are appreciated. No gait instability. Skin:  Skin is warm, dry and intact. No rash noted. Psychiatric: Mood and affect are normal. Speech and behavior are normal.  ____________________________________________    LABS (all labs ordered are listed, but only abnormal results are displayed)  Labs Reviewed  CBC - Abnormal; Notable for the following:    RDW 15.3 (*)    All other components within normal limits  COMPREHENSIVE METABOLIC PANEL - Abnormal; Notable for the following:    Glucose, Bld 131 (*)    AST 13 (*)    All other components within normal limits  TROPONIN I  TROPONIN I   ____________________________________________  EKG  ED ECG REPORT I, Alanis Clift,  Teena Irani, the attending physician, personally viewed and interpreted this ECG.   Date: 03/19/2015  EKG Time: 925  Rate: 81  Rhythm: normal sinus rhythm  Axis: Normal  Intervals:none  ST&T Change: No ST segment elevation or depression. No abnormal T-wave inversion.  ____________________________________________  RADIOLOGY  No active cardiopulmonary disease. ____________________________________________   PROCEDURES    ____________________________________________   INITIAL IMPRESSION / ASSESSMENT AND PLAN / ED COURSE  Pertinent labs & imaging results that were available during my care of the patient were reviewed by me and considered in my medical decision making (see chart for details).  ----------------------------------------- 1:38 PM on 03/19/2015 -----------------------------------------  Patient is resting comfortably at this time. Valium initially did not help and the patient was given a Percocet. I did review the patient's catheterization which was most recently done in August. She did not have any lesions that required intervention at that time. 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 She continues to take Plavix. Due to the patient's history and physical being very consistent with chest wall pain. I recommended that the patient continue with muscle cranes at home such as icy hot or Aspercreme. She may also take Tylenol. Due to interactions with Plavix she says she cannot take other  platelet inhibitors. This includes a lot of NSAIDs. She'll be discharged home. She understands plan and is willing to comply. PERC negative.  ____________________________________________   FINAL CLINICAL IMPRESSION(S) / ED DIAGNOSES  Chest wall pain.    Myrna Blazer, MD 03/19/15 [phone]

## 2015-03-19 NOTE — ED Notes (Signed)
Cough with no productive sputum began last night worsening this AM, now states chest tightness middle chest radiates to her back, states she took 1 nitro at work with some relief, pt awake and alert

## 2015-03-19 NOTE — ED Notes (Signed)
Chest pain began last night, constant cough in triage.

## 2015-03-20 ENCOUNTER — Emergency Department (HOSPITAL_COMMUNITY)
Admission: EM | Admit: 2015-03-20 | Discharge: 2015-03-20 | Disposition: A | Discharge status: Home / Self Care | Payer: BLUE CROSS/BLUE SHIELD | Attending: Emergency Medicine | Admitting: Emergency Medicine

## 2015-03-20 ENCOUNTER — Encounter (HOSPITAL_COMMUNITY): Payer: Self-pay | Admitting: Emergency Medicine

## 2015-03-20 ENCOUNTER — Emergency Department (HOSPITAL_COMMUNITY): Payer: BLUE CROSS/BLUE SHIELD

## 2015-03-20 DIAGNOSIS — J449 Chronic obstructive pulmonary disease, unspecified: Secondary | ICD-10-CM | POA: Diagnosis not present

## 2015-03-20 DIAGNOSIS — Z7902 Long term (current) use of antithrombotics/antiplatelets: Secondary | ICD-10-CM | POA: Diagnosis not present

## 2015-03-20 DIAGNOSIS — F419 Anxiety disorder, unspecified: Secondary | ICD-10-CM | POA: Diagnosis not present

## 2015-03-20 DIAGNOSIS — Z79899 Other long term (current) drug therapy: Secondary | ICD-10-CM | POA: Diagnosis not present

## 2015-03-20 DIAGNOSIS — R079 Chest pain, unspecified: Secondary | ICD-10-CM | POA: Diagnosis present

## 2015-03-20 DIAGNOSIS — Z9889 Other specified postprocedural states: Secondary | ICD-10-CM | POA: Diagnosis not present

## 2015-03-20 DIAGNOSIS — E119 Type 2 diabetes mellitus without complications: Secondary | ICD-10-CM | POA: Diagnosis not present

## 2015-03-20 DIAGNOSIS — R0789 Other chest pain: Secondary | ICD-10-CM

## 2015-03-20 DIAGNOSIS — Y9289 Other specified places as the place of occurrence of the external cause: Secondary | ICD-10-CM | POA: Insufficient documentation

## 2015-03-20 DIAGNOSIS — Z8719 Personal history of other diseases of the digestive system: Secondary | ICD-10-CM | POA: Diagnosis not present

## 2015-03-20 DIAGNOSIS — I2510 Atherosclerotic heart disease of native coronary artery without angina pectoris: Secondary | ICD-10-CM | POA: Diagnosis not present

## 2015-03-20 DIAGNOSIS — E7800 Pure hypercholesterolemia, unspecified: Secondary | ICD-10-CM | POA: Insufficient documentation

## 2015-03-20 DIAGNOSIS — J209 Acute bronchitis, unspecified: Secondary | ICD-10-CM

## 2015-03-20 DIAGNOSIS — Z8701 Personal history of pneumonia (recurrent): Secondary | ICD-10-CM | POA: Insufficient documentation

## 2015-03-20 DIAGNOSIS — G43909 Migraine, unspecified, not intractable, without status migrainosus: Secondary | ICD-10-CM | POA: Insufficient documentation

## 2015-03-20 DIAGNOSIS — G8929 Other chronic pain: Secondary | ICD-10-CM | POA: Insufficient documentation

## 2015-03-20 DIAGNOSIS — X58XXXA Exposure to other specified factors, initial encounter: Secondary | ICD-10-CM | POA: Diagnosis not present

## 2015-03-20 DIAGNOSIS — M1990 Unspecified osteoarthritis, unspecified site: Secondary | ICD-10-CM | POA: Diagnosis not present

## 2015-03-20 DIAGNOSIS — Z9861 Coronary angioplasty status: Secondary | ICD-10-CM | POA: Diagnosis not present

## 2015-03-20 DIAGNOSIS — Z7982 Long term (current) use of aspirin: Secondary | ICD-10-CM | POA: Insufficient documentation

## 2015-03-20 DIAGNOSIS — Y998 Other external cause status: Secondary | ICD-10-CM | POA: Diagnosis not present

## 2015-03-20 DIAGNOSIS — F17210 Nicotine dependence, cigarettes, uncomplicated: Secondary | ICD-10-CM | POA: Insufficient documentation

## 2015-03-20 DIAGNOSIS — Y9389 Activity, other specified: Secondary | ICD-10-CM | POA: Insufficient documentation

## 2015-03-20 DIAGNOSIS — F329 Major depressive disorder, single episode, unspecified: Secondary | ICD-10-CM | POA: Diagnosis not present

## 2015-03-20 DIAGNOSIS — I252 Old myocardial infarction: Secondary | ICD-10-CM | POA: Diagnosis not present

## 2015-03-20 DIAGNOSIS — Z7984 Long term (current) use of oral hypoglycemic drugs: Secondary | ICD-10-CM | POA: Diagnosis not present

## 2015-03-20 DIAGNOSIS — S20229A Contusion of unspecified back wall of thorax, initial encounter: Secondary | ICD-10-CM | POA: Diagnosis not present

## 2015-03-20 DIAGNOSIS — I10 Essential (primary) hypertension: Secondary | ICD-10-CM | POA: Diagnosis not present

## 2015-03-20 LAB — CBC
HCT: 41.3 % (ref 36.0–46.0)
Hemoglobin: 14.1 g/dL (ref 12.0–15.0)
MCH: 31.8 pg (ref 26.0–34.0)
MCHC: 34.1 g/dL (ref 30.0–36.0)
MCV: 93.2 fL (ref 78.0–100.0)
PLATELETS: 172 10*3/uL (ref 150–400)
RBC: 4.43 MIL/uL (ref 3.87–5.11)
RDW: 14.8 % (ref 11.5–15.5)
WBC: 7.6 10*3/uL (ref 4.0–10.5)

## 2015-03-20 LAB — I-STAT TROPONIN, ED: TROPONIN I, POC: 0 ng/mL (ref 0.00–0.08)

## 2015-03-20 LAB — BASIC METABOLIC PANEL
ANION GAP: 11 (ref 5–15)
BUN: 13 mg/dL (ref 6–20)
CALCIUM: 9.3 mg/dL (ref 8.9–10.3)
CO2: 21 mmol/L — ABNORMAL LOW (ref 22–32)
CREATININE: 0.82 mg/dL (ref 0.44–1.00)
Chloride: 108 mmol/L (ref 101–111)
GFR calc Af Amer: >60 mL/min (ref >60)
GLUCOSE: 174 mg/dL — AB (ref 65–99)
Potassium: 4.3 mmol/L (ref 3.5–5.1)
Sodium: 140 mmol/L (ref 135–145)

## 2015-03-20 MED ORDER — OXYCODONE-ACETAMINOPHEN 5-325 MG PO TABS
2.0000 | ORAL_TABLET | Freq: Once | ORAL | Status: AC
  Administered 2015-03-20: 2 via ORAL
  Filled 2015-03-20: qty 2

## 2015-03-20 MED ORDER — BENZONATATE 100 MG PO CAPS: 100.0000 mg | ORAL_CAPSULE | Freq: Three times a day (TID) | ORAL | Status: DC

## 2015-03-20 MED ORDER — DIAZEPAM 5 MG PO TABS
5.0000 mg | ORAL_TABLET | Freq: Once | ORAL | Status: AC
  Administered 2015-03-20: 5 mg via ORAL
  Filled 2015-03-20: qty 1

## 2015-03-20 MED ORDER — METHOCARBAMOL 500 MG PO TABS: 500.0000 mg | ORAL_TABLET | Freq: Two times a day (BID) | ORAL | Status: DC

## 2015-03-20 MED ORDER — OXYCODONE-ACETAMINOPHEN 5-325 MG PO TABS: 2.0000 | ORAL_TABLET | ORAL | Status: DC | PRN

## 2015-03-20 NOTE — Discharge Instructions (Signed)
Chest Wall Pain °Chest wall pain is pain in or around the bones and muscles of your chest. Sometimes, an injury causes this pain. Sometimes, the cause may not be known. This pain may take several weeks or longer to get better. °HOME CARE INSTRUCTIONS  °Pay attention to any changes in your symptoms. Take these actions to help with your pain:  °· Rest as told by your health care provider.   °· Avoid activities that cause pain. These include any activities that use your chest muscles or your abdominal and side muscles to lift heavy items.    °· If directed, apply ice to the painful area: °· Put ice in a plastic bag. °· Place a towel between your skin and the bag. °· Leave the ice on for 20 minutes, 2-3 times per day. °· Take over-the-counter and prescription medicines only as told by your health care provider. °· Do not use tobacco products, including cigarettes, chewing tobacco, and e-cigarettes. If you need help quitting, ask your health care provider. °· Keep all follow-up visits as told by your health care provider. This is important. °SEEK MEDICAL CARE IF: °· You have a fever. °· Your chest pain becomes worse. °· You have new symptoms. °SEEK IMMEDIATE MEDICAL CARE IF: °· You have nausea or vomiting. °· You feel sweaty or light-headed. °· You have a cough with phlegm (sputum) or you cough up blood. °· You develop shortness of breath. °  °This information is not intended to replace advice given to you by your health care provider. Make sure you discuss any questions you have with your health care provider. °  °Document Released: 01/06/2005 Document Revised: 09/27/2014 Document Reviewed: 04/03/2014 °Elsevier Interactive Patient Education ©2016 [employer] °Acute Bronchitis °Bronchitis is inflammation of the airways that extend from the windpipe into the lungs (bronchi). The inflammation often causes mucus to develop. This leads to a cough, which is the most common symptom of bronchitis.  °In acute bronchitis, the  condition usually develops suddenly and goes away over time, usually in a couple weeks. Smoking, allergies, and asthma can make bronchitis worse. Repeated episodes of bronchitis may cause further lung problems.  °CAUSES °Acute bronchitis is most often caused by the same virus that causes a cold. The virus can spread from person to person (contagious) through coughing, sneezing, and touching contaminated objects. °SIGNS AND SYMPTOMS  °· Cough.   °· Fever.   °· Coughing up mucus.   °· Body aches.   °· Chest congestion.   °· Chills.   °· Shortness of breath.   °· Sore throat.   °DIAGNOSIS  °Acute bronchitis is usually diagnosed through a physical exam. Your health care provider will also ask you questions about your medical history. Tests, such as chest X-rays, are sometimes done to rule out other conditions.  °TREATMENT  °Acute bronchitis usually goes away in a couple weeks. Oftentimes, no medical treatment is necessary. Medicines are sometimes given for relief of fever or cough. Antibiotic medicines are usually not needed but may be prescribed in certain situations. In some cases, an inhaler may be recommended to help reduce shortness of breath and control the cough. A cool mist vaporizer may also be used to help thin bronchial secretions and make it easier to clear the chest.  °HOME CARE INSTRUCTIONS °· Get plenty of rest.   °· Drink enough fluids to keep your urine clear or pale yellow (unless you have a medical condition that requires fluid restriction). Increasing fluids may help thin your respiratory secretions (sputum) and reduce chest congestion, and it will prevent   dehydration.   °· Take medicines only as directed by your health care provider. °· If you were prescribed an antibiotic medicine, finish it all even if you start to feel better. °· Avoid smoking and secondhand smoke. Exposure to cigarette smoke or irritating chemicals will make bronchitis worse. If you are a smoker, consider using nicotine gum or  skin patches to help control withdrawal symptoms. Quitting smoking will help your lungs heal faster.   °· Reduce the chances of another bout of acute bronchitis by washing your hands frequently, avoiding people with cold symptoms, and trying not to touch your hands to your mouth, nose, or eyes.   °· Keep all follow-up visits as directed by your health care provider.   °SEEK MEDICAL CARE IF: °Your symptoms do not improve after 1 week of treatment.  °SEEK IMMEDIATE MEDICAL CARE IF: °· You develop an increased fever or chills.   °· You have chest pain.   °· You have severe shortness of breath. °· You have bloody sputum.   °· You develop dehydration. °· You faint or repeatedly feel like you are going to pass out. °· You develop repeated vomiting. °· You develop a severe headache. °MAKE SURE YOU:  °· Understand these instructions. °· Will watch your condition. °· Will get help right away if you are not doing well or get worse. °  °This information is not intended to replace advice given to you by your health care provider. Make sure you discuss any questions you have with your health care provider. °  °Document Released: 02/14/2004 Document Revised: 01/27/2014 Document Reviewed: 06/29/2012 °Elsevier Interactive Patient Education ©2016 [employer] Inc. ° °

## 2015-03-20 NOTE — ED Provider Notes (Signed)
CSN: [id number]     Arrival date & time 03/20/15  1241 History   First MD Initiated Contact with Patient 03/20/15 1447     Chief Complaint  Patient presents with  . Chest Pain      HPI  Patient presents for evaluation of cough and chest pain. Patient coughing for 5 days. States when she coughs he feels spasms in her back and pain in the front of her chest. Has a history of previous MI. Does not feel similar. Most recent cath showed no progression of her coronary artery disease in August of last year. No fall no injury no trauma. No risk factors for PE. Does smoke. Is not on hormones, no history of cancer, no recent prolonged immobilization cast was fractures surgeries or other DVT or PE risks. Dry nonproductive cough. No hemoptysis or fever. Was immunized for influenza.  Seen and evaluated yesterday at [facility]. Had negative evaluation. States the doctor told her he was given give her something for pain and for muscle laxation. However, discharge was told to use "ice and icy hot". States she is miserable with cough every time she coughs she wakes up and has trouble sleeping due to pleuritic and muscular skeletal back pain  Past Medical History  Diagnosis Date  . Coronary artery disease   . Hypertension   . Hypercholesterolemia   . COPD (chronic obstructive pulmonary disease) (HCC)   . Type II diabetes mellitus (HCC)   . Pneumonia "several times"  . GERD (gastroesophageal reflux disease)   . Migraine     "a few times/month" (08/30/2014)  . Arthritis     "hips" (08/30/2014)  . Chronic lower back pain   . Anxiety   . Depression   . NSTEMI (non-ST elevated myocardial infarction) (HCC) 08/2013    [name] 09/17/2013   Past Surgical History  Procedure Laterality Date  . Tonsillectomy    . Percutaneous pinning phalanx fracture of hand Left     "plate & pins"  . Cardiac catheterization    . Coronary angioplasty    . Cardiac catheterization N/A 08/31/2014    Procedure:  Left Heart Cath and Coronary Angiography;  Surgeon: Kathleene Hazel, MD;  Location: [facility] INVASIVE CV LAB;  Service: Cardiovascular;  Laterality: N/A;   No family history on file. Social History  Substance Use Topics  . Smoking status: Current Every Day Smoker -- 1.00 packs/day for 30 years    Types: Cigarettes  . Smokeless tobacco: Never Used  . Alcohol Use: Yes     Comment: 08/30/2014 "I drink a couple times/month"   OB History    No data available     Review of Systems  Constitutional: Negative for fever, chills, diaphoresis, appetite change and fatigue.  HENT: Negative for mouth sores, sore throat and trouble swallowing.   Eyes: Negative for visual disturbance.  Respiratory: Positive for cough. Negative for chest tightness, shortness of breath and wheezing.   Cardiovascular: Positive for chest pain.  Gastrointestinal: Negative for nausea, vomiting, abdominal pain, diarrhea and abdominal distention.  Endocrine: Negative for polydipsia, polyphagia and polyuria.  Genitourinary: Negative for dysuria, frequency and hematuria.  Musculoskeletal: Positive for back pain. Negative for gait problem.  Skin: Negative for color change, pallor and rash.  Neurological: Negative for dizziness, syncope, light-headedness and headaches.  Hematological: Does not bruise/bleed easily.  Psychiatric/Behavioral: Negative for behavioral problems and confusion.      Allergies  Ibuprofen  Home Medications   Prior to Admission medications   Medication  Sig Start Date End Date Taking? Authorizing Provider  aspirin EC 81 MG tablet Take 81 mg by mouth daily.   Yes Historical Provider, MD  atorvastatin (LIPITOR) 80 MG tablet Take 80 mg by mouth daily.   Yes Historical Provider, MD  clopidogrel (PLAVIX) 75 MG tablet Take 75 mg by mouth daily.   Yes Historical Provider, MD  gabapentin (NEURONTIN) 300 MG capsule Take 300 mg by mouth 2 (two) times daily.   Yes Historical Provider, MD    Ipratropium-Albuterol (COMBIVENT RESPIMAT) 20-100 MCG/ACT AERS respimat Inhale 1 puff into the lungs 4 (four) times daily.    Yes Historical Provider, MD  isosorbide mononitrate (IMDUR) 60 MG 24 hr tablet Take 60 mg by mouth daily.   Yes Historical Provider, MD  metFORMIN (GLUCOPHAGE) 500 MG tablet Take 500 mg by mouth 2 (two) times daily with a meal.   Yes Historical Provider, MD  metoprolol tartrate (LOPRESSOR) 25 MG tablet Take 25 mg by mouth 2 (two) times daily.   Yes Historical Provider, MD  nitroGLYCERIN (NITROSTAT) 0.4 MG SL tablet Place 0.4 mg under the tongue every 5 (five) minutes as needed for chest pain.   Yes Historical Provider, MD  ranolazine (RANEXA) 1000 MG SR tablet Take 1,000 mg by mouth 2 (two) times daily.   Yes Historical Provider, MD  traMADol (ULTRAM) 50 MG tablet Take 50 mg by mouth 2 (two) times daily.   Yes Historical Provider, MD  traZODone (DESYREL) 50 MG tablet Take 50 mg by mouth at bedtime.   Yes Historical Provider, MD  benzonatate (TESSALON) 100 MG capsule Take 1 capsule (100 mg total) by mouth every 8 (eight) hours. 03/20/15   Rolland Porter, MD  methocarbamol (ROBAXIN) 500 MG tablet Take 1 tablet (500 mg total) by mouth 2 (two) times daily. 03/20/15   Rolland Porter, MD  oxyCODONE-acetaminophen (PERCOCET/ROXICET) 5-325 MG tablet Take 2 tablets by mouth every 4 (four) hours as needed. 03/20/15   Rolland Porter, MD   BP 118/75 mmHg  Pulse 68  Temp(Src) 97.7 F (36.5 C) (Oral)  Resp 18  Ht 5\' 4"  (1.626 m)  Wt 150 lb (68.04 kg)  BMI 25.73 kg/m2  SpO2 99%  LMP 02/21/2015 Physical Exam  Constitutional: She is oriented to person, place, and time. She appears well-developed and well-nourished. No distress.  HENT:  Head: Normocephalic.  Eyes: Conjunctivae are normal. Pupils are equal, round, and reactive to light. No scleral icterus.  Neck: Normal range of motion. Neck supple. No thyromegaly present.  Cardiovascular: Normal rate and regular rhythm.  Exam reveals no gallop  and no friction rub.   No murmur heard. Pulmonary/Chest: Effort normal and breath sounds normal. No respiratory distress. She has no wheezes. She has no rales.    Abdominal: Soft. Bowel sounds are normal. She exhibits no distension. There is no tenderness. There is no rebound.  Musculoskeletal: Normal range of motion.       Back:  Neurological: She is alert and oriented to person, place, and time.  Skin: Skin is warm and dry. No rash noted.  Psychiatric: She has a normal mood and affect. Her behavior is normal.    ED Course  Procedures (including critical care time) Labs Review Labs Reviewed  BASIC METABOLIC PANEL - Abnormal; Notable for the following:    CO2 21 (*)    Glucose, Bld 174 (*)    All other components within normal limits  CBC  I-STAT TROPOININ, ED    Imaging Review Dg Chest 2 View  03/20/2015  CLINICAL DATA:  Acute cough, chest and back pain for 6 days, smoker EXAM: CHEST  2 VIEW COMPARISON:  03/19/2015 FINDINGS: The heart size and mediastinal contours are within normal limits. Both lungs are clear. The visualized skeletal structures are unremarkable. IMPRESSION: No active cardiopulmonary disease. Electronically Signed   By: Judie Petit.  Shick M.D.   On: 03/20/2015 13:39   Dg Chest 2 View  03/19/2015  CLINICAL DATA:  PATIENT STARTED HAVING CHEST PAIN AND DRY COUGH LAST NIGHT. HISTORY OF HEART ATTACK NO HISTORY OF STROKE. ASTHMA. SMOKER. EXAM: CHEST  2 VIEW COMPARISON:  None. FINDINGS: Normal mediastinum and cardiac silhouette. Normal pulmonary vasculature. No evidence of effusion, infiltrate, or pneumothorax. No acute bony abnormality. IMPRESSION: No active cardiopulmonary disease. Electronically Signed   By: Genevive Bi M.D.   On: 03/19/2015 10:02   I have personally reviewed and evaluated these images and lab results as part of my medical decision-making.   EKG Interpretation None      MDM   Final diagnoses:  Acute bronchitis, unspecified organism  Chest wall  pain   EKG chest x-ray today showed no significant abnormalities. His pain is clearly chest wall and pleuritic. Doubt PE as she is not hypoxemic or tachycardic. Pain is reproducible, thus doubt dissection. He does not have marked hypertension. Advised her to stop smoking. Plan is pain control, cough suppressant, muscle ex.    Rolland Porter, MD 03/20/15 [phone]

## 2015-03-20 NOTE — ED Notes (Signed)
Pt has been having a dry non- productive cough for over 1 week. And yesterday she left work because she was having some central pain in her chest into her back. Was seen at [facility] and discharged. Pt states today pain is no better and worse when she coughs. Denies any sob, pt is warm and dry.

## 2015-05-22 ENCOUNTER — Encounter (HOSPITAL_COMMUNITY): Payer: Self-pay

## 2015-05-22 ENCOUNTER — Emergency Department (HOSPITAL_COMMUNITY): Payer: BLUE CROSS/BLUE SHIELD

## 2015-05-22 ENCOUNTER — Emergency Department (HOSPITAL_COMMUNITY)
Admission: EM | Admit: 2015-05-22 | Discharge: 2015-05-22 | Disposition: A | Discharge status: Home / Self Care | Payer: BLUE CROSS/BLUE SHIELD | Attending: Emergency Medicine | Admitting: Emergency Medicine

## 2015-05-22 DIAGNOSIS — J449 Chronic obstructive pulmonary disease, unspecified: Secondary | ICD-10-CM | POA: Diagnosis not present

## 2015-05-22 DIAGNOSIS — Z7984 Long term (current) use of oral hypoglycemic drugs: Secondary | ICD-10-CM | POA: Insufficient documentation

## 2015-05-22 DIAGNOSIS — G8929 Other chronic pain: Secondary | ICD-10-CM | POA: Insufficient documentation

## 2015-05-22 DIAGNOSIS — Z79899 Other long term (current) drug therapy: Secondary | ICD-10-CM | POA: Insufficient documentation

## 2015-05-22 DIAGNOSIS — Z8701 Personal history of pneumonia (recurrent): Secondary | ICD-10-CM | POA: Insufficient documentation

## 2015-05-22 DIAGNOSIS — Z9889 Other specified postprocedural states: Secondary | ICD-10-CM | POA: Insufficient documentation

## 2015-05-22 DIAGNOSIS — Z7982 Long term (current) use of aspirin: Secondary | ICD-10-CM | POA: Insufficient documentation

## 2015-05-22 DIAGNOSIS — M1990 Unspecified osteoarthritis, unspecified site: Secondary | ICD-10-CM | POA: Insufficient documentation

## 2015-05-22 DIAGNOSIS — F17210 Nicotine dependence, cigarettes, uncomplicated: Secondary | ICD-10-CM | POA: Diagnosis not present

## 2015-05-22 DIAGNOSIS — F419 Anxiety disorder, unspecified: Secondary | ICD-10-CM | POA: Insufficient documentation

## 2015-05-22 DIAGNOSIS — E7800 Pure hypercholesterolemia, unspecified: Secondary | ICD-10-CM | POA: Insufficient documentation

## 2015-05-22 DIAGNOSIS — I252 Old myocardial infarction: Secondary | ICD-10-CM | POA: Insufficient documentation

## 2015-05-22 DIAGNOSIS — R079 Chest pain, unspecified: Secondary | ICD-10-CM | POA: Diagnosis present

## 2015-05-22 DIAGNOSIS — I10 Essential (primary) hypertension: Secondary | ICD-10-CM | POA: Diagnosis not present

## 2015-05-22 DIAGNOSIS — I2510 Atherosclerotic heart disease of native coronary artery without angina pectoris: Secondary | ICD-10-CM | POA: Insufficient documentation

## 2015-05-22 DIAGNOSIS — F329 Major depressive disorder, single episode, unspecified: Secondary | ICD-10-CM | POA: Insufficient documentation

## 2015-05-22 DIAGNOSIS — Z7902 Long term (current) use of antithrombotics/antiplatelets: Secondary | ICD-10-CM | POA: Diagnosis not present

## 2015-05-22 LAB — CBC
HEMATOCRIT: 42.5 % (ref 36.0–46.0)
HEMOGLOBIN: 14.4 g/dL (ref 12.0–15.0)
MCH: 32.4 pg (ref 26.0–34.0)
MCHC: 33.9 g/dL (ref 30.0–36.0)
MCV: 95.5 fL (ref 78.0–100.0)
Platelets: 214 10*3/uL (ref 150–400)
RBC: 4.45 MIL/uL (ref 3.87–5.11)
RDW: 14 % (ref 11.5–15.5)
WBC: 8.8 10*3/uL (ref 4.0–10.5)

## 2015-05-22 LAB — BASIC METABOLIC PANEL
ANION GAP: 9 (ref 5–15)
BUN: 9 mg/dL (ref 6–20)
CO2: 22 mmol/L (ref 22–32)
Calcium: 9.1 mg/dL (ref 8.9–10.3)
Chloride: 108 mmol/L (ref 101–111)
Creatinine, Ser: 0.69 mg/dL (ref 0.44–1.00)
GFR calc Af Amer: >60 mL/min (ref >60)
GFR calc non Af Amer: >60 mL/min (ref >60)
GLUCOSE: 163 mg/dL — AB (ref 65–99)
POTASSIUM: 4.1 mmol/L (ref 3.5–5.1)
Sodium: 139 mmol/L (ref 135–145)

## 2015-05-22 LAB — I-STAT TROPONIN, ED
Troponin i, poc: 0 ng/mL (ref 0.00–0.08)
Troponin i, poc: 0 ng/mL (ref 0.00–0.08)

## 2015-05-22 MED ORDER — ONDANSETRON HCL 4 MG/2ML IJ SOLN
4.0000 mg | Freq: Once | INTRAMUSCULAR | Status: AC
  Administered 2015-05-22: 4 mg via INTRAVENOUS
  Filled 2015-05-22: qty 2

## 2015-05-22 MED ORDER — MORPHINE SULFATE (PF) 4 MG/ML IV SOLN
4.0000 mg | Freq: Once | INTRAVENOUS | Status: AC
  Administered 2015-05-22: 4 mg via INTRAVENOUS
  Filled 2015-05-22: qty 1

## 2015-05-22 MED ORDER — OXYCODONE-ACETAMINOPHEN 5-325 MG PO TABS: 1.0000 | ORAL_TABLET | Freq: Four times a day (QID) | ORAL | Status: DC | PRN

## 2015-05-22 NOTE — ED Provider Notes (Signed)
CSN: [id number]     Arrival date & time 05/22/15  1132 History   First MD Initiated Contact with Patient 05/22/15 1621     Chief Complaint  Patient presents with  . Chest Pain    (Consider location/radiation/quality/duration/timing/severity/associated sxs/prior Treatment) HPI Comments: Patient with history of known coronary artery disease, most recent cath in the summer 2016 with multiple areas of nonobstructive disease, on maximal medical therapy -- presents with chest pain starting at 10 PM last night. Pain was in the upper middle of her chest and left upper chest. Pain did not radiate. Patient took a Percocet which relieved pain and allowed her to sleep. Pain continued upon waking this morning at approximately 5 PM she took 2 nitroglycerin which did not change her pain. She did not have any associated shortness of breath, nausea or vomiting, diaphoresis. Pain is worse with movement of her left arm and palpation of her upper chest. 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 She denies any recent fevers or cough. No URI symptoms. Patient denies risk factors for pulmonary embolism including: unilateral leg swelling, history of DVT/PE/other blood clots, use of exogenous hormones, recent immobilizations, recent surgery, recent travel (>4hr segment), malignancy, hemoptysis. Onset of symptoms acute. Course is constant.  Patient is a 45 y.o. female presenting with chest pain. The history is provided by the patient.  Chest Pain Associated symptoms: no abdominal pain, no back pain, no cough, no diaphoresis, no fever, no nausea, no palpitations, no shortness of breath and not vomiting     Past Medical History  Diagnosis Date  . Coronary artery disease   . Hypertension   . Hypercholesterolemia   . COPD (chronic obstructive pulmonary disease) (HCC)   . Type II diabetes mellitus (HCC)   . Pneumonia "several times"  . GERD  (gastroesophageal reflux disease)   . Migraine     "a few times/month" (08/30/2014)  . Arthritis     "hips" (08/30/2014)  . Chronic lower back pain   . Anxiety   . Depression   . NSTEMI (non-ST elevated myocardial infarction) (HCC) 08/2013    [name] 09/17/2013   Past Surgical History  Procedure Laterality Date  . Tonsillectomy    . Percutaneous pinning phalanx fracture of hand Left     "plate & pins"  . Cardiac catheterization    . Coronary angioplasty    . Cardiac catheterization N/A 08/31/2014    Procedure: Left Heart Cath and Coronary Angiography;  Surgeon: Kathleene Hazel, MD;  Location: [facility] INVASIVE CV LAB;  Service: Cardiovascular;  Laterality: N/A;   No family history on file. Social History  Substance Use Topics  . Smoking status: Current Every Day Smoker -- 1.00 packs/day for 30 years    Types: Cigarettes  . Smokeless tobacco: Never Used  . Alcohol Use: Yes     Comment: 08/30/2014 "I drink a couple times/month"   OB History    No data available     Review of Systems  Constitutional: Negative for fever and diaphoresis.  Eyes: Negative for redness.  Respiratory: Negative for cough and shortness of breath.   Cardiovascular: Positive for chest pain. Negative for palpitations and leg swelling.  Gastrointestinal: Negative for nausea, vomiting and abdominal pain.  Genitourinary: Negative for dysuria.  Musculoskeletal: Positive for arthralgias. Negative for back pain and neck pain.  Skin: Negative for rash.  Neurological: Negative for syncope and light-headedness.  Psychiatric/Behavioral: The  patient is not nervous/anxious.    Allergies  Ibuprofen  Home Medications   Prior to Admission medications   Medication Sig Start Date End Date Taking? Authorizing Provider  aspirin EC 81 MG tablet Take 81 mg by mouth daily.    Historical Provider, MD  atorvastatin (LIPITOR) 80 MG tablet Take 80 mg by mouth daily.    Historical Provider, MD  benzonatate (TESSALON) 100 MG  capsule Take 1 capsule (100 mg total) by mouth every 8 (eight) hours. 03/20/15   Rolland Porter, MD  clopidogrel (PLAVIX) 75 MG tablet Take 75 mg by mouth daily.    Historical Provider, MD  gabapentin (NEURONTIN) 300 MG capsule Take 300 mg by mouth 2 (two) times daily.    Historical Provider, MD  Ipratropium-Albuterol (COMBIVENT RESPIMAT) 20-100 MCG/ACT AERS respimat Inhale 1 puff into the lungs 4 (four) times daily.     Historical Provider, MD  isosorbide mononitrate (IMDUR) 60 MG 24 hr tablet Take 60 mg by mouth daily.    Historical Provider, MD  metFORMIN (GLUCOPHAGE) 500 MG tablet Take 500 mg by mouth 2 (two) times daily with a meal.    Historical Provider, MD  methocarbamol (ROBAXIN) 500 MG tablet Take 1 tablet (500 mg total) by mouth 2 (two) times daily. 03/20/15   Rolland Porter, MD  metoprolol tartrate (LOPRESSOR) 25 MG tablet Take 25 mg by mouth 2 (two) times daily.    Historical Provider, MD  nitroGLYCERIN (NITROSTAT) 0.4 MG SL tablet Place 0.4 mg under the tongue every 5 (five) minutes as needed for chest pain.    Historical Provider, MD  oxyCODONE-acetaminophen (PERCOCET/ROXICET) 5-325 MG tablet Take 2 tablets by mouth every 4 (four) hours as needed. 03/20/15   Rolland Porter, MD  ranolazine (RANEXA) 1000 MG SR tablet Take 1,000 mg by mouth 2 (two) times daily.    Historical Provider, MD  traMADol (ULTRAM) 50 MG tablet Take 50 mg by mouth 2 (two) times daily.    Historical Provider, MD  traZODone (DESYREL) 50 MG tablet Take 50 mg by mouth at bedtime.    Historical Provider, MD   BP 115/85 mmHg  Pulse 58  Temp(Src) 98.2 F (36.8 C)  Resp 18  Ht 5\' 4"  (1.626 m)  Wt 68.04 kg  BMI 25.73 kg/m2  SpO2 98%  LMP 05/09/2015   Physical Exam  Constitutional: She appears well-developed and well-nourished.  HENT:  Head: Normocephalic and atraumatic.  Mouth/Throat: Mucous membranes are normal. Mucous membranes are not dry.  Eyes: Conjunctivae are normal.  Neck: Trachea normal and normal range of motion.  Neck supple. Normal carotid pulses and no JVD present. No muscular tenderness present. Carotid bruit is not present. No tracheal deviation present.  Cardiovascular: Normal rate, regular rhythm, S1 normal, S2 normal, normal heart sounds and intact distal pulses.  Exam reveals no decreased pulses.   No murmur heard. Pulmonary/Chest: Effort normal. No respiratory distress. She has no wheezes. She exhibits tenderness (Pain is made worse with palpation of her central and left upper chest and with lifting her left arm.).  Abdominal: Soft. Normal aorta and bowel sounds are normal. There is no tenderness. There is no rebound and no guarding.  Musculoskeletal: Normal range of motion.  Neurological: She is alert.  Skin: Skin is warm and dry. She is not diaphoretic. No cyanosis. No pallor.  Psychiatric: She has a normal mood and affect.  Nursing note and vitals reviewed.   ED Course  Procedures (including critical care time) Labs Review Labs Reviewed  BASIC  METABOLIC PANEL - Abnormal; Notable for the following:    Glucose, Bld 163 (*)    All other components within normal limits  CBC  I-STAT TROPOININ, ED  I-STAT TROPOININ, ED    Imaging Review Dg Chest 2 View  05/22/2015  CLINICAL DATA:  Mid chest pain radiating to the left shoulder EXAM: CHEST  2 VIEW COMPARISON:  03/20/2015 FINDINGS: The heart size and mediastinal contours are within normal limits. Both lungs are clear. The visualized skeletal structures are unremarkable. IMPRESSION: No active cardiopulmonary disease. Electronically Signed   By: Elige Ko   On: 05/22/2015 12:33   I have personally reviewed and evaluated these images and lab results as part of my medical decision-making.   EKG Interpretation   Date/Time:  Tuesday May 22 2015 16:49:51 EDT Ventricular Rate:  54 PR Interval:  170 QRS Duration: 86 QT Interval:  462 QTC Calculation: 438 R Axis:   64 Text Interpretation:  Sinus rhythm Low voltage, precordial leads agree.  no  STEMI. Confirmed by Donnald Garre, MD, Lebron Conners [phone]) on 05/22/2015 5:47:54 PM       4:31 PM Patient seen and examined. Work-up initiated. Medications ordered.   Vital signs reviewed and are as follows: BP 115/85 mmHg  Pulse 58  Temp(Src) 98.2 F (36.8 C)  Resp 18  Ht 5\' 4"  (1.626 m)  Wt 68.04 kg  BMI 25.73 kg/m2  SpO2 98%  LMP 05/09/2015  6:05 PM Patient history and presentation discussed with Dr. Donnald Garre. We have reviewed troponin neg x2, unchanged EKG x 2. Patient has had resolution of symptoms after pain medication.   She is strongly encouraged to follow-up with PCP/cardiologist this week.  Percocet x 6 given for pain at home. Patient counseled on use of narcotic pain medications. Counseled not to combine these medications with others containing tylenol. Urged not to drink alcohol, drive, or perform any other activities that requires focus while taking these medications. The patient verbalizes understanding and agrees with the plan.    Patient was counseled to return with severe chest pain, especially if the pain is crushing or pressure-like and spreads to the arms, back, neck, or jaw, or if they have sweating, nausea, or shortness of breath with the pain. They were encouraged to call 911 with these symptoms.   They were also told to return if their chest pain gets worse and does not go away with rest, they have an attack of chest pain lasting longer than usual despite rest and treatment with the medications their caregiver has prescribed, if they wake from sleep with chest pain or shortness of breath, if they feel dizzy or faint, if they have chest pain not typical of their usual pain, or if they have any other emergent concerns regarding their health.  The patient verbalized understanding and agreed.    MDM   Final diagnoses:  Chest pain, unspecified chest pain type   Patient with known subcritical CAD presents with mid-left chest pain. Pain is reproducible with movement and  palpation and I feel this is likely chest wall pain. Chest x-ray does not show any pneumonia or pneumothorax. Troponin is negative 2. EKG is unchanged during ED stay. Pain has been present for approximally 18 hours. Feel patient is low risk for ACS today given history (poor story for ACS/MI), negative troponin(s), normal/unchanged EKG. Signs and symptoms are not suggestive of PE. No tachycardia, hypoxia, shortness of breath. Patient is on Plavix. She will continue maximal medical therapy at home and follow up with  her doctors this week. Return precautions as above.  Renne Crigler, PA-C 05/22/15 1810  Arby Barrette, MD 05/22/15 2303

## 2015-05-22 NOTE — ED Notes (Signed)
Patient here with upper chest pain since last pm. Pain worse with inspiration and movement. Took 2 SL ntg with no relief. Coughing on arrival

## 2015-05-22 NOTE — Discharge Instructions (Signed)
Please read and follow all provided instructions.  Your diagnoses today include:  1. Chest pain, unspecified chest pain type    Tests performed today include:  An EKG of your heart  A chest x-ray  Cardiac enzymes - a blood test for heart muscle damage  Blood counts and electrolytes  Vital signs. See below for your results today.   Medications prescribed:   Percocet (oxycodone/acetaminophen) - narcotic pain medication  DO NOT drive or perform any activities that require you to be awake and alert because this medicine can make you drowsy. BE VERY CAREFUL not to take multiple medicines containing Tylenol (also called acetaminophen). Doing so can lead to an overdose which can damage your liver and cause liver failure and possibly death.  Take any prescribed medications only as directed.  Follow-up instructions: Please follow-up with your primary care provider and cardiologist as soon as you can for further evaluation of your symptoms.   Return instructions:  SEEK IMMEDIATE MEDICAL ATTENTION IF:  You have severe chest pain, especially if the pain is crushing or pressure-like and spreads to the arms, back, neck, or jaw, or if you have sweating, nausea (feeling sick to your stomach), or shortness of breath. THIS IS AN EMERGENCY. Don't wait to see if the pain will go away. Get medical help at once. Call 911 or [phone] (operator). DO NOT drive yourself to the hospital.   Your chest pain gets worse and does not go away with rest.   You have an attack of chest pain lasting longer than usual, despite rest and treatment with the medications your caregiver has prescribed.   You wake from sleep with chest pain or shortness of breath.  You feel dizzy or faint.  You have chest pain not typical of your usual pain for which you originally saw your caregiver.   You have any other emergent concerns regarding your health.  Additional Information: Chest pain comes from many different causes. Your  caregiver has diagnosed you as having chest pain that is not specific for one problem, but does not require admission.  You are at low risk for an acute heart condition or other serious illness.   Your vital signs today were: BP 115/85 mmHg   Pulse 58   Temp(Src) 98.2 F (36.8 C)   Resp 18   Ht 5\' 4"  (1.626 m)   Wt 68.04 kg   BMI 25.73 kg/m2   SpO2 98%   LMP 05/09/2015 If your blood pressure (BP) was elevated above 135/85 this visit, please have this repeated by your doctor within one month. --------------

## 2015-12-17 ENCOUNTER — Encounter: Payer: Self-pay | Admitting: Emergency Medicine

## 2015-12-17 ENCOUNTER — Emergency Department
Admission: EM | Admit: 2015-12-17 | Discharge: 2015-12-17 | Disposition: A | Discharge status: Home / Self Care | Payer: BLUE CROSS/BLUE SHIELD | Attending: Emergency Medicine | Admitting: Emergency Medicine

## 2015-12-17 ENCOUNTER — Emergency Department: Payer: BLUE CROSS/BLUE SHIELD

## 2015-12-17 DIAGNOSIS — E119 Type 2 diabetes mellitus without complications: Secondary | ICD-10-CM | POA: Insufficient documentation

## 2015-12-17 DIAGNOSIS — Z7982 Long term (current) use of aspirin: Secondary | ICD-10-CM | POA: Insufficient documentation

## 2015-12-17 DIAGNOSIS — Z79899 Other long term (current) drug therapy: Secondary | ICD-10-CM | POA: Diagnosis not present

## 2015-12-17 DIAGNOSIS — I10 Essential (primary) hypertension: Secondary | ICD-10-CM | POA: Diagnosis not present

## 2015-12-17 DIAGNOSIS — I25119 Atherosclerotic heart disease of native coronary artery with unspecified angina pectoris: Secondary | ICD-10-CM | POA: Insufficient documentation

## 2015-12-17 DIAGNOSIS — M549 Dorsalgia, unspecified: Secondary | ICD-10-CM | POA: Diagnosis not present

## 2015-12-17 DIAGNOSIS — Z7984 Long term (current) use of oral hypoglycemic drugs: Secondary | ICD-10-CM | POA: Insufficient documentation

## 2015-12-17 DIAGNOSIS — F17210 Nicotine dependence, cigarettes, uncomplicated: Secondary | ICD-10-CM | POA: Diagnosis not present

## 2015-12-17 DIAGNOSIS — J449 Chronic obstructive pulmonary disease, unspecified: Secondary | ICD-10-CM | POA: Diagnosis not present

## 2015-12-17 DIAGNOSIS — J45909 Unspecified asthma, uncomplicated: Secondary | ICD-10-CM | POA: Insufficient documentation

## 2015-12-17 DIAGNOSIS — F1290 Cannabis use, unspecified, uncomplicated: Secondary | ICD-10-CM | POA: Insufficient documentation

## 2015-12-17 DIAGNOSIS — R079 Chest pain, unspecified: Secondary | ICD-10-CM | POA: Diagnosis present

## 2015-12-17 HISTORY — DX: Unspecified asthma, uncomplicated: J45.909

## 2015-12-17 LAB — CBC
HCT: 43.3 % (ref 35.0–47.0)
HEMOGLOBIN: 14.8 g/dL (ref 12.0–16.0)
MCH: 33.1 pg (ref 26.0–34.0)
MCHC: 34.3 g/dL (ref 32.0–36.0)
MCV: 96.4 fL (ref 80.0–100.0)
Platelets: 241 10*3/uL (ref 150–440)
RBC: 4.49 MIL/uL (ref 3.80–5.20)
RDW: 14.6 % — ABNORMAL HIGH (ref 11.5–14.5)
WBC: 9.4 10*3/uL (ref 3.6–11.0)

## 2015-12-17 LAB — COMPREHENSIVE METABOLIC PANEL
ALK PHOS: 61 U/L (ref 38–126)
ALT: 13 U/L — AB (ref 14–54)
AST: 18 U/L (ref 15–41)
Albumin: 4.2 g/dL (ref 3.5–5.0)
Anion gap: 12 (ref 5–15)
BUN: 11 mg/dL (ref 6–20)
CALCIUM: 9.5 mg/dL (ref 8.9–10.3)
CO2: 20 mmol/L — ABNORMAL LOW (ref 22–32)
CREATININE: 0.78 mg/dL (ref 0.44–1.00)
Chloride: 105 mmol/L (ref 101–111)
Glucose, Bld: 130 mg/dL — ABNORMAL HIGH (ref 65–99)
Potassium: 3.6 mmol/L (ref 3.5–5.1)
Sodium: 137 mmol/L (ref 135–145)
Total Bilirubin: 0.6 mg/dL (ref 0.3–1.2)
Total Protein: 7.2 g/dL (ref 6.5–8.1)

## 2015-12-17 LAB — TROPONIN I

## 2015-12-17 MED ORDER — IOPAMIDOL (ISOVUE-370) INJECTION 76%
100.0000 mL | Freq: Once | INTRAVENOUS | Status: AC | PRN
  Administered 2015-12-17: 100 mL via INTRAVENOUS

## 2015-12-17 MED ORDER — FENTANYL CITRATE (PF) 100 MCG/2ML IJ SOLN
100.0000 ug | Freq: Once | INTRAMUSCULAR | Status: AC
  Administered 2015-12-17: 100 ug via INTRAVENOUS
  Filled 2015-12-17: qty 2

## 2015-12-17 MED ORDER — SODIUM CHLORIDE 0.9 % IV BOLUS (SEPSIS)
1000.0000 mL | Freq: Once | INTRAVENOUS | Status: AC
  Administered 2015-12-17: 1000 mL via INTRAVENOUS

## 2015-12-17 MED ORDER — OXYCODONE-ACETAMINOPHEN 5-325 MG PO TABS
1.0000 | ORAL_TABLET | Freq: Once | ORAL | Status: AC
  Administered 2015-12-17: 1 via ORAL
  Filled 2015-12-17: qty 1

## 2015-12-17 NOTE — ED Notes (Signed)
Patient transported to CT 

## 2015-12-17 NOTE — ED Triage Notes (Signed)
Pt started with acute onset pain between shoulder blades last night. Hurts with deep breath. Hx MI. Has had SHOB.

## 2015-12-17 NOTE — ED Notes (Signed)
Pt does not want to wait for repeat labs. md aware

## 2015-12-17 NOTE — ED Notes (Signed)
Pt calling out for pain meds. Pt on bed tearful. Valerie,RN made aware pt is in pain and asking for pain meds.

## 2015-12-17 NOTE — ED Provider Notes (Signed)
[facility] Emergency Department Provider Note  ____________________________________________  Time seen: Approximately 1:12 PM  I have reviewed the triage vital signs and the nursing notes.   HISTORY  Chief Complaint Back Pain    HPI Krista Alvarez is a 45 y.o. female with a history of CAD, COPD, DM 2 presenting with chest pain and severe back pain. The patient reports that last night she was at work when she developed a chest pain and severe mid thoracic pain radiating downwards similar to her previous MI. This pain persisted today, and is now worse. Worse with deep breaths.  She denies any associated cough or cold symptoms, fever or chills, palpitations, lightheadedness or syncope.  Cardiac Cath Report, 8/16:  Prox RCA to Dist RCA lesion, 30% stenosed.  RPDA lesion, 30% stenosed.  2nd Mrg lesion, 40% stenosed.  Mid Cx to Dist Cx lesion, 40% stenosed.  Mid Cx lesion, 40% stenosed.  Dist LAD lesion, 30% stenosed.  Prox LAD lesion, 30% stenosed.  The left ventricular systolic function is normal.   Past Medical History:  Diagnosis Date  . Anxiety   . Arthritis    "hips" (08/30/2014)  . Asthma   . Chronic lower back pain   . COPD (chronic obstructive pulmonary disease) (HCC)   . Coronary artery disease   . Depression   . GERD (gastroesophageal reflux disease)   . Hypercholesterolemia   . Hypertension   . Migraine    "a few times/month" (08/30/2014)  . NSTEMI (non-ST elevated myocardial infarction) (HCC) 08/2013   Krista Alvarez 09/17/2013  . Pneumonia "several times"  . Type II diabetes mellitus [facility])     Patient Active Problem List   Diagnosis Date Noted  . Coronary artery disease involving native coronary artery of native heart with angina pectoris (HCC)   . Type 2 diabetes mellitus without complication (HCC)   . Tobacco abuse   . COLD (chronic obstructive lung disease) (HCC)   . Pain in the chest   . Chest pain 08/30/2014    Past  Surgical History:  Procedure Laterality Date  . CARDIAC CATHETERIZATION    . CARDIAC CATHETERIZATION N/A 08/31/2014   Procedure: Left Heart Cath and Coronary Angiography;  Surgeon: Kathleene Hazel, MD;  Location: [facility] INVASIVE CV LAB;  Service: Cardiovascular;  Laterality: N/A;  . CORONARY ANGIOPLASTY    . PERCUTANEOUS PINNING PHALANX FRACTURE OF HAND Left    "plate & pins"  . TONSILLECTOMY      Current Outpatient Rx  . Order #: [id number] Class: Historical Med  . Order #: [id number] Class: Historical Med  . Order #: [id number] Class: Historical Med  . Order #: [id number] Class: Historical Med  . Order #: [id number] Class: Historical Med  . Order #: [id number] Class: Historical Med  . Order #: [id number] Class: Historical Med  . Order #: [id number] Class: Historical Med  . Order #: [id number] Class: Historical Med  . Order #: [id number] Class: Print  . Order #: [id number] Class: Historical Med  . Order #: [id number] Class: Historical Med    Allergies Ibuprofen  History reviewed. No pertinent family history.  Social History Social History  Substance Use Topics  . Smoking status: Current Every Day Smoker    Packs/day: 1.00    Years: 30.00    Types: Cigarettes  . Smokeless tobacco: Never Used  . Alcohol use Yes     Comment: 08/30/2014 "I drink a couple times/month"    Review of Systems Constitutional: No fever/chills.No lightheadedness or syncope. Eyes: No visual changes. ENT: No sore throat. No congestion or rhinorrhea.  No neck pain. Cardiovascular: Positive chest pain. Denies palpitations. Respiratory: Denies shortness of breath.  No cough. Gastrointestinal: No abdominal pain.  No nausea, no vomiting.  No diarrhea.  No constipation. Genitourinary: Negative for dysuria. Musculoskeletal: Positive for back pain. Skin: Negative for rash. Neurological: Negative for headaches. No focal numbness, tingling or weakness.   10-point ROS otherwise  negative.  ____________________________________________   PHYSICAL EXAM:  VITAL SIGNS: ED Triage Vitals  Enc Vitals Group     BP 12/17/15 1257 (!) 145/85     Pulse Rate 12/17/15 1257 72     Resp 12/17/15 1257 (!) 26     Temp 12/17/15 1257 97.9 F (36.6 C)     Temp Source 12/17/15 1257 Oral     SpO2 12/17/15 1257 100 %     Weight 12/17/15 1258 150 lb (68 kg)     Height 12/17/15 1258 5\' 4"  (1.626 m)     Head Circumference --      Peak Flow --      Pain Score 12/17/15 1258 9     Pain Loc --      Pain Edu? --      Excl. in GC? --     Constitutional: Patient is alert and able to answer questions appropriate. She is tearful and significantly uncomfortable, sitting forward on the stretcher.. Eyes: Conjunctivae are normal.  EOMI. No scleral icterus. Head: Atraumatic. Nose: No congestion/rhinnorhea. Mouth/Throat: Mucous membranes are moist.  Neck: No stridor.  Supple.  No JVD. No meningismus. Cardiovascular: Normal rate, regular rhythm. No murmurs, rubs or gallops.  Respiratory: Normal respiratory effort.  No accessory muscle use or retractions. Lungs CTAB.  No wheezes, rales or ronchi. Gastrointestinal: Soft, nontender and nondistended.  No guarding or rebound.  No peritoneal signs. Musculoskeletal: No LE edema. No ttp in the calves or palpable cords.  Negative Homan's sign. Patient does have some mild tenderness to palpation in the mid thoracic to the lumbar spine, but this does not reproduce her pain "it is deeper." Neurologic:  A&Ox3.  Speech is clear.  Face and smile are symmetric.  EOMI.  Moves all extremities well. Skin:  Skin is warm, dry and intact. No rash noted. Psychiatric: Mood is normal. Crying and anxious. Speech and behavior are normal.  Normal judgement.  ____________________________________________   LABS (all labs ordered are listed, but only abnormal results are displayed)  Labs Reviewed  CBC - Abnormal; Notable for the following:       Result Value   RDW  14.6 (*)    All other components within normal limits  COMPREHENSIVE METABOLIC PANEL - Abnormal; Notable for the following:    CO2 20 (*)    Glucose, Bld 130 (*)    ALT 13 (*)    All other components within normal limits  TROPONIN I  TROPONIN I   ____________________________________________  EKG  ED ECG REPORT I, Rockne Menghini, the attending physician, personally viewed and interpreted this ECG.   Date: 12/17/2015  EKG Time: 1322  Rate: 66  Rhythm: normal sinus rhythm  Axis: normal  Intervals:none  ST&T Change: Nonspecific T-wave inversion in V1 and V2. No ST elevation. Poor baseline tracing.  ____________________________________________  RADIOLOGY  Ct Angio Chest/abd/pel For Dissection W And/or Wo Contrast  Result Date: 12/17/2015 CLINICAL DATA:  Acute onset pain between shoulder blades last night with pleuritic component of pain. Shortness of breath. Evaluate for aortic dissection. EXAM: CT ANGIOGRAPHY CHEST, ABDOMEN AND PELVIS TECHNIQUE: Multidetector CT imaging through the chest, abdomen  and pelvis was performed using the standard protocol during bolus administration of intravenous contrast. Multiplanar reconstructed images and MIPs were obtained and reviewed to evaluate the vascular anatomy. CONTRAST:  100 mL Isovue 370 IV COMPARISON:  02/07/2014 CTA chest FINDINGS: CTA CHEST FINDINGS Cardiovascular: Heart is normal in size. Thoracic aorta is normal in caliber without evidence of aneurysm or dissection. Pulmonary arterial system is within normal. Remaining vascular structures are normal. Mediastinum/Nodes: No evidence of hilar or mediastinal adenopathy. Remaining mediastinal structures are within normal. Lungs/Pleura: Lungs are clear without airspace opacification, effusion or pneumothorax. Airways are within normal. Musculoskeletal: Within normal. Review of the MIP images confirms the above findings. CTA ABDOMEN AND PELVIS FINDINGS VASCULAR Aorta: Normal in caliber  without evidence of aneurysm or dissection. Celiac: Normal. SMA: Subtle focal narrowing near the origin of the SMA which is otherwise within normal and patent. Renals: Normal. IMA: Within normal. Inflow: Minimal calcified atherosclerotic plaque involving common iliac arteries without stenosis or occlusion. Veins: Within normal. Review of the MIP images confirms the above findings. NON-VASCULAR Hepatobiliary: Within normal. Pancreas: Within normal. Spleen: Within normal. Adrenals/Urinary Tract: Adrenal glands are normal. Kidneys are normal in size without hydronephrosis or nephrolithiasis. There are a few small bilateral renal cysts. Ureters and bladder are within normal. Stomach/Bowel: The stomach and small bowel are within normal. Appendix is normal. Colon is within normal. Lymphatic: Within normal. Reproductive: Within normal. Other: No free fluid or focal inflammatory change. Musculoskeletal: Subtle degenerative change of the spine and hips. Review of the MIP images confirms the above findings. IMPRESSION: Normal thoracoabdominal aorta without evidence of aneurysm or dissection. No acute findings in the chest, abdomen or pelvis. Few small bilateral renal cysts. Electronically Signed   By: Elberta Fortisaniel  Boyle M.D.   On: 12/17/2015 14:01    ____________________________________________   PROCEDURES  Procedure(s) performed: None  Procedures  Critical Care performed: No ____________________________________________   INITIAL IMPRESSION / ASSESSMENT AND PLAN / ED COURSE  Pertinent labs & imaging results that were available during my care of the patient were reviewed by me and considered in my medical decision making (see chart for details).  45 y.o. female with a history of CAD, MI, COPD, DM, presenting with chest pain and back pain which started last night and are worse at this time. There are multiple possible etiologies for the patient's pain, but I am most concerned about aortic pathology including  aortic dissection and we will immediately get a CT angiogram to evaluate for this. The patient will also need evaluation for PE. In addition, will evaluate the patient for acute MI with EKG, and troponin. For pain, will initiate fentanyl and hold on aspirin or anticoagulation until we have the results of the aortic scan. Severe muscular skeletal pain or underlying lung pathology could also cause this patient's pain. Plan reevaluation for final disposition.  ----------------------------------------- 4:00 PM on 12/17/2015 -----------------------------------------  At this time, the patient is much more comfortable. Her CT does not show any aortic dissection. Her troponin is negative and she does not have ischemic changes on her EKG. 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 I will give her work note to not lift anything greater than 15 pounds until her pain has improved. For her follow-up, I'll have her see Dr. Adrian BlackwaterShaukat Khan for cardiology follow-up and Dr. Budd PalmerNeelam Kahn for her back pain.  ____________________________________________  FINAL CLINICAL IMPRESSION(S) /  ED DIAGNOSES  Final diagnoses:  Chest pain, unspecified type  Acute midline back pain, unspecified back location    Clinical Course       NEW MEDICATIONS STARTED DURING THIS VISIT:  New Prescriptions   No medications on file      Rockne Menghini, MD 12/17/15 1605

## 2015-12-17 NOTE — Discharge Instructions (Signed)
For your back pain, you may take Tylenol or Motrin. You may also apply a heating pad for 10 minutes every 2 hours to decrease pain. Please do not lift anything heavier than 15 pounds until your pain has resolved.  Return to the emergency department if you develop worsening pain, numbness tingling or weakness, difficulty walking, changes in bowel or bladder function. Also return if you develop chest pain shortness of breath, palpitations, lightheadedness or fainting, or any other symptoms concerning to you.

## 2016-01-11 ENCOUNTER — Emergency Department: Payer: BLUE CROSS/BLUE SHIELD

## 2016-01-11 ENCOUNTER — Emergency Department
Admission: EM | Admit: 2016-01-11 | Discharge: 2016-01-11 | Disposition: A | Discharge status: Home / Self Care | Payer: BLUE CROSS/BLUE SHIELD | Attending: Emergency Medicine | Admitting: Emergency Medicine

## 2016-01-11 DIAGNOSIS — Y999 Unspecified external cause status: Secondary | ICD-10-CM | POA: Diagnosis not present

## 2016-01-11 DIAGNOSIS — F17210 Nicotine dependence, cigarettes, uncomplicated: Secondary | ICD-10-CM | POA: Insufficient documentation

## 2016-01-11 DIAGNOSIS — Y939 Activity, unspecified: Secondary | ICD-10-CM | POA: Diagnosis not present

## 2016-01-11 DIAGNOSIS — J45909 Unspecified asthma, uncomplicated: Secondary | ICD-10-CM | POA: Diagnosis not present

## 2016-01-11 DIAGNOSIS — Z23 Encounter for immunization: Secondary | ICD-10-CM | POA: Diagnosis not present

## 2016-01-11 DIAGNOSIS — W5501XA Bitten by cat, initial encounter: Secondary | ICD-10-CM | POA: Insufficient documentation

## 2016-01-11 DIAGNOSIS — Y92019 Unspecified place in single-family (private) house as the place of occurrence of the external cause: Secondary | ICD-10-CM | POA: Insufficient documentation

## 2016-01-11 DIAGNOSIS — J449 Chronic obstructive pulmonary disease, unspecified: Secondary | ICD-10-CM | POA: Insufficient documentation

## 2016-01-11 DIAGNOSIS — E119 Type 2 diabetes mellitus without complications: Secondary | ICD-10-CM | POA: Insufficient documentation

## 2016-01-11 DIAGNOSIS — S51811A Laceration without foreign body of right forearm, initial encounter: Secondary | ICD-10-CM | POA: Diagnosis not present

## 2016-01-11 DIAGNOSIS — Z79899 Other long term (current) drug therapy: Secondary | ICD-10-CM | POA: Diagnosis not present

## 2016-01-11 DIAGNOSIS — I10 Essential (primary) hypertension: Secondary | ICD-10-CM | POA: Insufficient documentation

## 2016-01-11 MED ORDER — OXYCODONE-ACETAMINOPHEN 5-325 MG PO TABS: 1.0000 | ORAL_TABLET | Freq: Four times a day (QID) | ORAL | 0 refills | Status: DC | PRN

## 2016-01-11 MED ORDER — AMOXICILLIN-POT CLAVULANATE 875-125 MG PO TABS: 1.0000 | ORAL_TABLET | Freq: Two times a day (BID) | ORAL | 0 refills | Status: DC

## 2016-01-11 MED ORDER — OXYCODONE-ACETAMINOPHEN 5-325 MG PO TABS
1.0000 | ORAL_TABLET | Freq: Once | ORAL | Status: AC
  Administered 2016-01-11: 1 via ORAL
  Filled 2016-01-11: qty 1

## 2016-01-11 MED ORDER — LIDOCAINE HCL (PF) 1 % IJ SOLN
5.0000 mL | Freq: Once | INTRAMUSCULAR | Status: AC
  Administered 2016-01-11: 5 mL via INTRADERMAL

## 2016-01-11 MED ORDER — OXYCODONE-ACETAMINOPHEN 5-325 MG PO TABS
ORAL_TABLET | ORAL | Status: AC
  Filled 2016-01-11: qty 1

## 2016-01-11 MED ORDER — LIDOCAINE-EPINEPHRINE (PF) 1 %-1:200000 IJ SOLN: 30.0000 mL | Freq: Once | INTRAMUSCULAR | Status: DC

## 2016-01-11 MED ORDER — OXYCODONE-ACETAMINOPHEN 5-325 MG PO TABS
1.0000 | ORAL_TABLET | Freq: Once | ORAL | Status: AC
  Administered 2016-01-11: 1 via ORAL

## 2016-01-11 MED ORDER — LIDOCAINE HCL (PF) 1 % IJ SOLN
INTRAMUSCULAR | Status: AC
  Administered 2016-01-11: 5 mL via INTRADERMAL
  Filled 2016-01-11: qty 10

## 2016-01-11 MED ORDER — TETANUS-DIPHTH-ACELL PERTUSSIS 5-2.5-18.5 LF-MCG/0.5 IM SUSP
0.5000 mL | Freq: Once | INTRAMUSCULAR | Status: AC
  Administered 2016-01-11: 0.5 mL via INTRAMUSCULAR
  Filled 2016-01-11: qty 0.5

## 2016-01-11 NOTE — ED Triage Notes (Signed)
Patient ambulatory to triage with steady gait, without difficulty, tearful; pt reports being attacked by her cat this morning; bite to right FA, back, leg, buttock

## 2016-01-11 NOTE — ED Provider Notes (Signed)
[facility] Emergency Department Provider Note  ____________________________________________  Time seen: Approximately 8:47 AM  I have reviewed the triage vital signs and the nursing notes.   HISTORY  Chief Complaint Animal Bite    HPI Krista Alvarez is a 45 y.o. female who complains of multiple scratches and wounds to the upper back, right arm, right leg after being attacked by her house cat this morning. The cat was adopted by the patient as a feral kitten, has had regular but very care and up-to-date on rabies immunization. CAD is indoor only has not been outside, not been acting unusual, has not been in fights with other animals, has not been sick recently.  Morning when the patient got out of bed the cat abruptly attacked her with multiple bites and scratches. The attack was interrupted by the patient's husband to remove the cat.  No head injury, no eye injury. No chest pain or shortness of breath. Only cutaneous complaints.     Past Medical History:  Diagnosis Date  . Anxiety   . Arthritis    "hips" (08/30/2014)  . Asthma   . Chronic lower back pain   . COPD (chronic obstructive pulmonary disease) (HCC)   . Coronary artery disease   . Depression   . GERD (gastroesophageal reflux disease)   . Hypercholesterolemia   . Hypertension   . Migraine    "a few times/month" (08/30/2014)  . NSTEMI (non-ST elevated myocardial infarction) (HCC) 08/2013   [name]/notes 09/17/2013  . Pneumonia "several times"  . Type II diabetes mellitus [facility](HCC)      Patient Active Problem List   Diagnosis Date Noted  . Coronary artery disease involving native coronary artery of native heart with angina pectoris (HCC)   . Type 2 diabetes mellitus without complication (HCC)   . Tobacco abuse   . COLD (chronic obstructive lung disease) (HCC)   . Pain in the chest   . Chest pain 08/30/2014     Past Surgical History:  Procedure Laterality Date  . CARDIAC CATHETERIZATION     . CARDIAC CATHETERIZATION N/A 08/31/2014   Procedure: Left Heart Cath and Coronary Angiography;  Surgeon: Kathleene Hazelhristopher D McAlhany, MD;  [facility];  Service: Cardiovascular;  Laterality: N/A;  . CORONARY ANGIOPLASTY    . PERCUTANEOUS PINNING PHALANX FRACTURE OF HAND Left    "plate & pins"  . TONSILLECTOMY       Prior to Admission medications   Medication Sig Start Date End Date Taking? Authorizing Provider  amoxicillin-clavulanate (AUGMENTIN) 875-125 MG tablet Take 1 tablet by mouth 2 (two) times daily. 01/11/16   Sharman CheekPhillip Tamarius Rosenfield, MD  aspirin EC 81 MG tablet Take 81 mg by mouth daily.    Historical Provider, MD  atorvastatin (LIPITOR) 80 MG tablet Take 80 mg by mouth daily.    Historical Provider, MD  clopidogrel (PLAVIX) 75 MG tablet Take 75 mg by mouth daily.    Historical Provider, MD  gabapentin (NEURONTIN) 300 MG capsule Take 300 mg by mouth 2 (two) times daily.    Historical Provider, MD  Ipratropium-Albuterol (COMBIVENT RESPIMAT) 20-100 MCG/ACT AERS respimat Inhale 1 puff into the lungs 4 (four) times daily.     Historical Provider, MD  isosorbide mononitrate (IMDUR) 60 MG 24 hr tablet Take 60 mg by mouth daily.    Historical Provider, MD  metFORMIN (GLUCOPHAGE) 500 MG tablet Take 500 mg by mouth 2 (two) times daily with a meal.    Historical Provider, MD  metoprolol tartrate (  LOPRESSOR) 25 MG tablet Take 25 mg by mouth 2 (two) times daily.    Historical Provider, MD  nitroGLYCERIN (NITROSTAT) 0.4 MG SL tablet Place 0.4 mg under the tongue every 5 (five) minutes as needed for chest pain.    Historical Provider, MD  oxyCODONE-acetaminophen (ROXICET) 5-325 MG tablet Take 1 tablet by mouth every 6 (six) hours as needed for severe pain. 01/11/16   Sharman Cheek, MD  ranolazine (RANEXA) 1000 MG SR tablet Take 1,000 mg by mouth 2 (two) times daily.    Historical Provider, MD  traZODone (DESYREL) 50 MG tablet Take 50 mg by mouth at bedtime.    Historical Provider, MD      Allergies Ibuprofen   No family history on file.  Social History Social History  Substance Use Topics  . Smoking status: Current Every Day Smoker    Packs/day: 1.00    Years: 30.00    Types: Cigarettes  . Smokeless tobacco: Never Used  . Alcohol use Yes     Comment: 08/30/2014 "I drink a couple times/month"    Review of Systems  Constitutional:   No fever or chills.  ENT:   No sore throat. No rhinorrhea. Cardiovascular:   No chest pain. Respiratory:   No dyspnea or cough. Gastrointestinal:   Negative for abdominal pain, vomiting and diarrhea.  Genitourinary:   Negative for dysuria or difficulty urinating. Musculoskeletal:   Negative for focal pain or swelling Neurological:   Negative for headaches 10-point ROS otherwise negative.  ____________________________________________   PHYSICAL EXAM:  VITAL SIGNS: ED Triage Vitals  Enc Vitals Group     BP 01/11/16 0635 (!) 169/87     Pulse Rate 01/11/16 0635 92     Resp 01/11/16 0635 20     Temp 01/11/16 0635 97.3 F (36.3 C)     Temp Source 01/11/16 0635 Oral     SpO2 01/11/16 0635 99 %     Weight 01/11/16 0633 150 lb (68 kg)     Height 01/11/16 0633 5\' 4"  (1.626 m)     Head Circumference --      Peak Flow --      Pain Score 01/11/16 0633 10     Pain Loc --      Pain Edu? --      Excl. in GC? --     Vital signs reviewed, nursing assessments reviewed.   Constitutional:   Alert and oriented. Well appearing and in no distress. Eyes:   No scleral icterus. No conjunctival pallor. PERRL. EOMI.  No nystagmus. ENT   Head:   Normocephalic and atraumatic.   Nose:   No congestion/rhinnorhea. No septal hematoma   Mouth/Throat:   MMM, no pharyngeal erythema. No peritonsillar mass.    Neck:   No stridor. No SubQ emphysema. No meningismus. Hematological/Lymphatic/Immunilogical:   No cervical lymphadenopathy. Cardiovascular:   RRR. Symmetric bilateral radial and DP pulses.  No murmurs.  Respiratory:    Normal respiratory effort without tachypnea nor retractions. Breath sounds are clear and equal bilaterally. No wheezes/rales/rhonchi. Gastrointestinal:   Soft and nontender. Non distended. There is no CVA tenderness.  No rebound, rigidity, or guarding. Genitourinary:   deferred Musculoskeletal:   Nontender with normal range of motion in all extremities. No joint effusions.  No lower extremity tenderness.  No edema. Neurologic:   Normal speech and language.  CN 2-10 normal. Motor grossly intact. No gross focal neurologic deficits are appreciated.  Skin:    Skin is warm, dry With multiple wounds.  There are innumerable puncture wounds and superficial linear scratches over the upper back, right upper extremity, and right lower extremity. These are all hemostatic with small amounts of clotted blood over them. On the right forearm there are 2 lacerations, one linear, 2 cm in length, the other a triangular flap wound, 2 x 3 cm.  ____________________________________________    LABS (pertinent positives/negatives) (all labs ordered are listed, but only abnormal results are displayed) Labs Reviewed - No data to display ____________________________________________   EKG    ____________________________________________    RADIOLOGY  X-ray right forearm shows soft tissue swelling, no clear foreign body. Radiology report reviewed. There is no bone density object, concern would be for a tooth chip.  ____________________________________________   PROCEDURES Procedures LACERATION REPAIR #1 Performed by: Scotty Court, Garritt Molyneux Authorized by: Sharman Cheek Consent: Verbal consent obtained. Risks and benefits: risks, benefits and alternatives were discussed Consent given by: patient Patient identity confirmed: provided demographic data Prepped and Draped in normal sterile fashion Wound explored  Laceration Location: R distal forearm  Laceration Length: 2cm  No Foreign Bodies seen or  palpated  Anesthesia: local infiltration  Local anesthetic: lidocaine 1% without epinephrine  Anesthetic total: 2 ml  Irrigation method: syringe Amount of cleaning: extensive  Skin closure: ethilon 4-0  Number of sutures: 2  Technique: simple interrupted  Patient tolerance: Patient tolerated the procedure well with no immediate complications.     LACERATION REPAIR #2 Performed by: Sharman Cheek Authorized by: Sharman Cheek Consent: Verbal consent obtained. Risks and benefits: risks, benefits and alternatives were discussed Consent given by: patient Patient identity confirmed: provided demographic data Prepped and Draped in normal sterile fashion Wound explored  Laceration Location: R mid-forearm  Laceration Length: 3cm  No Foreign Bodies seen or palpated  Anesthesia: local infiltration  Local anesthetic: lidocaine 1% without epinephrine  Anesthetic total: 3 ml  Irrigation method: syringe Amount of cleaning: standard  Skin closure: 4-0 ethilon  Number of sutures: 4  Technique: simple interrupted.  Patient tolerance: Patient tolerated the procedure well with no immediate complications.  ____________________________________________   INITIAL IMPRESSION / ASSESSMENT AND PLAN / ED COURSE  Pertinent labs & imaging results that were available during my care of the patient were reviewed by me and considered in my medical decision making (see chart for details).  Patient well appearing no acute distress, presents with multiple soft tissue wounds from attacked by her house. No evidence of vascular injury. No pulsatile bleeding. All wounds hemostatic. Compartment soft. Wounds cleaned, irrigated, inspected through full range of motion of the hand and wrist, repaired. No foreign body identified at this time, unlikely. Tetanus updated. Started on Augmentin. Short course of Percocet for immediate pain. Follow-up with primary care. Return precautions and warning  signs discussed for compartment syndrome or worsening infection.     Clinical Course as of Jan 10 846  Fri Jan 11, 2016  0801 XR neg for retained FB  [PS]    Clinical Course User Index [PS] Sharman Cheek, MD   ____________________________________________   FINAL CLINICAL IMPRESSION(S) / ED DIAGNOSES  Final diagnoses:  Cat bite, initial encounter  Laceration of right forearm, initial encounter      New Prescriptions   AMOXICILLIN-CLAVULANATE (AUGMENTIN) 875-125 MG TABLET    Take 1 tablet by mouth 2 (two) times daily.   OXYCODONE-ACETAMINOPHEN (ROXICET) 5-325 MG TABLET    Take 1 tablet by mouth every 6 (six) hours as needed for severe pain.     Portions of this note were generated  with Scientist, clinical (histocompatibility and immunogenetics). Dictation errors may occur despite best attempts at proofreading.    Sharman Cheek, MD 01/11/16 [phone]

## 2016-05-19 ENCOUNTER — Emergency Department (HOSPITAL_COMMUNITY)
Admission: EM | Admit: 2016-05-19 | Discharge: 2016-05-19 | Disposition: A | Discharge status: Home / Self Care | Payer: Self-pay | Attending: Emergency Medicine | Admitting: Emergency Medicine

## 2016-05-19 ENCOUNTER — Encounter (HOSPITAL_COMMUNITY): Payer: Self-pay

## 2016-05-19 DIAGNOSIS — I10 Essential (primary) hypertension: Secondary | ICD-10-CM | POA: Insufficient documentation

## 2016-05-19 DIAGNOSIS — Z7982 Long term (current) use of aspirin: Secondary | ICD-10-CM | POA: Insufficient documentation

## 2016-05-19 DIAGNOSIS — Z7984 Long term (current) use of oral hypoglycemic drugs: Secondary | ICD-10-CM | POA: Insufficient documentation

## 2016-05-19 DIAGNOSIS — K047 Periapical abscess without sinus: Secondary | ICD-10-CM | POA: Insufficient documentation

## 2016-05-19 DIAGNOSIS — I252 Old myocardial infarction: Secondary | ICD-10-CM | POA: Insufficient documentation

## 2016-05-19 DIAGNOSIS — I2510 Atherosclerotic heart disease of native coronary artery without angina pectoris: Secondary | ICD-10-CM | POA: Insufficient documentation

## 2016-05-19 DIAGNOSIS — J449 Chronic obstructive pulmonary disease, unspecified: Secondary | ICD-10-CM | POA: Insufficient documentation

## 2016-05-19 DIAGNOSIS — Z955 Presence of coronary angioplasty implant and graft: Secondary | ICD-10-CM | POA: Insufficient documentation

## 2016-05-19 DIAGNOSIS — Z79899 Other long term (current) drug therapy: Secondary | ICD-10-CM | POA: Insufficient documentation

## 2016-05-19 DIAGNOSIS — E119 Type 2 diabetes mellitus without complications: Secondary | ICD-10-CM | POA: Insufficient documentation

## 2016-05-19 DIAGNOSIS — F17210 Nicotine dependence, cigarettes, uncomplicated: Secondary | ICD-10-CM | POA: Insufficient documentation

## 2016-05-19 MED ORDER — CLINDAMYCIN HCL 150 MG PO CAPS: 300.0000 mg | ORAL_CAPSULE | Freq: Three times a day (TID) | ORAL | 0 refills | Status: DC

## 2016-05-19 MED ORDER — OXYCODONE-ACETAMINOPHEN 5-325 MG PO TABS: 1.0000 | ORAL_TABLET | Freq: Four times a day (QID) | ORAL | 0 refills | Status: DC | PRN

## 2016-05-19 MED ORDER — ONDANSETRON 4 MG PO TBDP
4.0000 mg | ORAL_TABLET | Freq: Once | ORAL | Status: AC
  Administered 2016-05-19: 4 mg via ORAL
  Filled 2016-05-19: qty 1

## 2016-05-19 MED ORDER — OXYCODONE-ACETAMINOPHEN 5-325 MG PO TABS
2.0000 | ORAL_TABLET | Freq: Once | ORAL | Status: AC
  Administered 2016-05-19: 2 via ORAL
  Filled 2016-05-19: qty 2

## 2016-05-19 MED ORDER — CLINDAMYCIN HCL 150 MG PO CAPS
600.0000 mg | ORAL_CAPSULE | Freq: Once | ORAL | Status: AC
  Administered 2016-05-19: 600 mg via ORAL
  Filled 2016-05-19: qty 4

## 2016-05-19 NOTE — Discharge Instructions (Signed)
You have been seen by your caregiver because of dental pain.  °SEEK MEDICAL ATTENTION IF: °The exam and treatment you received today has been provided on an emergency basis only. This is not a substitute for complete medical or dental care. If your problem worsens or new symptoms (problems) appear, and you are unable to arrange prompt follow-up care with your dentist, call or return to this location. °CALL YOUR DENTIST OR RETURN IMMEDIATELY IF you develop a fever, rash, difficulty breathing or swallowing, neck or facial swelling, or other potentially serious concerns. ° ° °[employer]  °[employer]  °[facility]  °[address]  °[place], [place] [address]  °Phone [phone]  °[facility] [employer] [facility] in [place], [place], exemplifies the Dental School?s vision to improve the health and quality of life of all [place] by creating leaders with a passion to care for the underserved and by leading the nation in community-based, service learning oral health education. °We are committed to offering comprehensive general dental services for adults, children and special needs patients in a safe, caring and professional setting. ° °Appointments: Our clinic is open Monday through Friday 8:00 a.m. until 5:00 p.m. The amount of time scheduled for an appointment depends on the patient?s specific needs. We ask that you keep your appointed time for care or provide 24-hour notice of all appointment changes. Parents or legal guardians must accompany minor children. ° °Payment for Services: Medicaid and other insurance plans are welcome. Payment for services is due when services are rendered and may be made by cash or credit card. If you have dental insurance, we will assist you with your claim submission.  ° °Emergencies: Emergency services will be provided Monday through Friday on a  walk-in basis. Please arrive early for emergency services. After hours emergency services will be provided for patients of record as required. ° °Services:  °Comprehensive General Dentistry  °Children?s Dentistry  °Oral Surgery - Extractions  °Root Canals  °Sealants and Tooth Colored Fillings  °Crowns and Bridges  °Dentures and Partial Dentures  °Implant Services  °Periodontal Services and Cleanings  °Cosmetic Tooth Whitening  °Digital Radiography  °3-D/[facility] Imaging ° ° °

## 2016-05-19 NOTE — ED Provider Notes (Signed)
[facility]-EMERGENCY DEPT Provider Note    By signing my name below, I, Krista Alvarez, attest that this documentation has been prepared under the direction and in the presence of Arthor Captain, PA-C. Electronically Signed: Earmon Alvarez, ED Scribe. 05/19/16. 1:05 PM.    History   Chief Complaint Chief Complaint  Patient presents with  . Facial Swelling   The history is provided by the patient and medical records. No language interpreter was used.    Krista Alvarez is a 46 y.o. female with PMHx of HTN, HLD, T2DM, CAD and COPD who presents to the Emergency Department complaining of left sided facial swelling that began two days ago. She reports associated left sided dental pain. She has not taken anything for pain but has been applying heat and ice therapy. Speaking and eating increases her pain. She denies fever, chills, nausea, vomiting, drooling, difficulty breathing or swallowing, trismus.   Past Medical History:  Diagnosis Date  . Anxiety   . Arthritis    "hips" (08/30/2014)  . Asthma   . Chronic lower back pain   . COPD (chronic obstructive pulmonary disease) (HCC)   . Coronary artery disease   . Depression   . GERD (gastroesophageal reflux disease)   . Hypercholesterolemia   . Hypertension   . Migraine    "a few times/month" (08/30/2014)  . NSTEMI (non-ST elevated myocardial infarction) (HCC) 08/2013   [name] 09/17/2013  . Pneumonia "several times"  . Type II diabetes mellitus [facility])     Patient Active Problem List   Diagnosis Date Noted  . Coronary artery disease involving native coronary artery of native heart with angina pectoris (HCC)   . Type 2 diabetes mellitus without complication (HCC)   . Tobacco abuse   . COLD (chronic obstructive lung disease) (HCC)   . Pain in the chest   . Chest pain 08/30/2014  . TOBACCO DEPENDENCE 03/19/2006  . PARESTHESIA NOS 03/19/2006    Past Surgical History:  Procedure Laterality Date  . CARDIAC CATHETERIZATION    .  CARDIAC CATHETERIZATION N/A 08/31/2014   Procedure: Left Heart Cath and Coronary Angiography;  Surgeon: Kathleene Hazel, MD;  Location: [facility] INVASIVE CV LAB;  Service: Cardiovascular;  Laterality: N/A;  . CORONARY ANGIOPLASTY    . PERCUTANEOUS PINNING PHALANX FRACTURE OF HAND Left    "plate & pins"  . TONSILLECTOMY      OB History    No data available       Home Medications    Prior to Admission medications   Medication Sig Start Date End Date Taking? Authorizing Provider  amoxicillin-clavulanate (AUGMENTIN) 875-125 MG tablet Take 1 tablet by mouth 2 (two) times daily. 01/11/16   Sharman Cheek, MD  aspirin EC 81 MG tablet Take 81 mg by mouth daily.    Historical Provider, MD  atorvastatin (LIPITOR) 80 MG tablet Take 80 mg by mouth daily.    Historical Provider, MD  clopidogrel (PLAVIX) 75 MG tablet Take 75 mg by mouth daily.    Historical Provider, MD  gabapentin (NEURONTIN) 300 MG capsule Take 300 mg by mouth 2 (two) times daily.    Historical Provider, MD  Ipratropium-Albuterol (COMBIVENT RESPIMAT) 20-100 MCG/ACT AERS respimat Inhale 1 puff into the lungs 4 (four) times daily.     Historical Provider, MD  isosorbide mononitrate (IMDUR) 60 MG 24 hr tablet Take 60 mg by mouth daily.    Historical Provider, MD  metFORMIN (GLUCOPHAGE) 500 MG tablet Take 500 mg by mouth 2 (two) times daily with  a meal.    Historical Provider, MD  metoprolol tartrate (LOPRESSOR) 25 MG tablet Take 25 mg by mouth 2 (two) times daily.    Historical Provider, MD  nitroGLYCERIN (NITROSTAT) 0.4 MG SL tablet Place 0.4 mg under the tongue every 5 (five) minutes as needed for chest pain.    Historical Provider, MD  oxyCODONE-acetaminophen (ROXICET) 5-325 MG tablet Take 1 tablet by mouth every 6 (six) hours as needed for severe pain. 01/11/16   Sharman Cheek, MD  ranolazine (RANEXA) 1000 MG SR tablet Take 1,000 mg by mouth 2 (two) times daily.    Historical Provider, MD  traZODone (DESYREL) 50 MG tablet  Take 50 mg by mouth at bedtime.    Historical Provider, MD    Family History No family history on file.  Social History Social History  Substance Use Topics  . Smoking status: Current Every Day Smoker    Packs/day: 1.00    Years: 30.00    Types: Cigarettes  . Smokeless tobacco: Never Used  . Alcohol use Yes     Comment: 08/30/2014 "I drink a couple times/month"     Allergies   Ibuprofen   Review of Systems Review of Systems  Constitutional: Negative for chills and fever.  HENT: Positive for dental problem and facial swelling. Negative for drooling and trouble swallowing.   Respiratory: Negative for shortness of breath.   Gastrointestinal: Negative for nausea and vomiting.     Physical Exam Updated Vital Signs BP 137/89 (BP Location: Right Arm)   Pulse 79   Temp 98.2 F (36.8 C) (Oral)   Resp 18   Ht 5\' 2"  (1.575 m)   Wt 150 lb (68 kg)   SpO2 100%   BMI 27.44 kg/m   Physical Exam  Constitutional: She is oriented to person, place, and time. She appears well-developed and well-nourished.  HENT:  Head: Normocephalic and atraumatic.  Mouth/Throat: Uvula is midline, oropharynx is clear and moist and mucous membranes are normal. There is trismus in the jaw. Abnormal dentition. Dental caries present.  Left lower second molar is extremely loose. Significant trismus. Swelling to the left lower mandible. No fluctuance of the tissue. Overall poor dentition.  Neck: Normal range of motion.  Cardiovascular: Normal rate.   Pulmonary/Chest: Effort normal.  Musculoskeletal: Normal range of motion.  Neurological: She is alert and oriented to person, place, and time.  Skin: Skin is warm and dry.  Psychiatric: She has a normal mood and affect. Her behavior is normal.  Nursing note and vitals reviewed.    ED Treatments / Results  DIAGNOSTIC STUDIES: Oxygen Saturation is 100% on RA, normal by my interpretation.   COORDINATION OF CARE: 12:57 PM- Will prescribe antibiotic and  refer to a dentist. Pt verbalizes understanding and agrees to plan.  Medications  oxyCODONE-acetaminophen (PERCOCET/ROXICET) 5-325 MG per tablet 2 tablet (not administered)  ondansetron (ZOFRAN-ODT) disintegrating tablet 4 mg (not administered)    Labs (all labs ordered are listed, but only abnormal results are displayed) Labs Reviewed - No data to display  EKG  EKG Interpretation None       Radiology No results found.  Procedures Procedures (including critical care time)  Medications Ordered in ED Medications  oxyCODONE-acetaminophen (PERCOCET/ROXICET) 5-325 MG per tablet 2 tablet (not administered)  ondansetron (ZOFRAN-ODT) disintegrating tablet 4 mg (not administered)     Initial Impression / Assessment and Plan / ED Course  I have reviewed the triage vital signs and the nursing notes.  Pertinent labs & imaging results  that were available during my care of the patient were reviewed by me and considered in my medical decision making (see chart for details).     Patient with left lower dental pain that began two days ago. There is left sided facial swelling noted. No abscess requiring immediate incision and drainage.  Exam not concerning for Ludwig's angina or pharyngeal abscess.  Will treat with PCN. Pt instructed to follow-up with dentist.  Discussed return precautions. Pt safe for discharge.   Final Clinical Impressions(s) / ED Diagnoses   Final diagnoses:  Dental abscess    New Prescriptions New Prescriptions   No medications on file    I personally performed the services described in this documentation, which was scribed in my presence. The recorded information has been reviewed and is accurate.        Arthor Captain, PA-C 05/19/16 1447    Benjiman Core, MD 05/19/16 2014

## 2016-05-19 NOTE — ED Notes (Signed)
Pt ambulated to room from waiting area. Pt had no complaints.  

## 2016-05-19 NOTE — ED Triage Notes (Signed)
Per Pt, Pt is coming from home with left sided facial swelling and pain that started on Saturday. Pt reports a "knot" that came up in her mouth and then the swelling continued.

## 2016-05-25 ENCOUNTER — Emergency Department
Admission: EM | Admit: 2016-05-25 | Discharge: 2016-05-25 | Disposition: A | Discharge status: Home / Self Care | Payer: Self-pay | Attending: Emergency Medicine | Admitting: Emergency Medicine

## 2016-05-25 ENCOUNTER — Emergency Department: Payer: Self-pay

## 2016-05-25 ENCOUNTER — Encounter: Payer: Self-pay | Admitting: Emergency Medicine

## 2016-05-25 DIAGNOSIS — J323 Chronic sphenoidal sinusitis: Secondary | ICD-10-CM

## 2016-05-25 DIAGNOSIS — I2510 Atherosclerotic heart disease of native coronary artery without angina pectoris: Secondary | ICD-10-CM | POA: Insufficient documentation

## 2016-05-25 DIAGNOSIS — J322 Chronic ethmoidal sinusitis: Secondary | ICD-10-CM

## 2016-05-25 DIAGNOSIS — R55 Syncope and collapse: Secondary | ICD-10-CM

## 2016-05-25 DIAGNOSIS — J449 Chronic obstructive pulmonary disease, unspecified: Secondary | ICD-10-CM | POA: Insufficient documentation

## 2016-05-25 DIAGNOSIS — J0130 Acute sphenoidal sinusitis, unspecified: Secondary | ICD-10-CM | POA: Insufficient documentation

## 2016-05-25 DIAGNOSIS — R519 Headache, unspecified: Secondary | ICD-10-CM

## 2016-05-25 DIAGNOSIS — I10 Essential (primary) hypertension: Secondary | ICD-10-CM | POA: Insufficient documentation

## 2016-05-25 DIAGNOSIS — E119 Type 2 diabetes mellitus without complications: Secondary | ICD-10-CM | POA: Insufficient documentation

## 2016-05-25 DIAGNOSIS — J0120 Acute ethmoidal sinusitis, unspecified: Secondary | ICD-10-CM | POA: Insufficient documentation

## 2016-05-25 DIAGNOSIS — Z79899 Other long term (current) drug therapy: Secondary | ICD-10-CM | POA: Insufficient documentation

## 2016-05-25 DIAGNOSIS — F17210 Nicotine dependence, cigarettes, uncomplicated: Secondary | ICD-10-CM | POA: Insufficient documentation

## 2016-05-25 DIAGNOSIS — J45909 Unspecified asthma, uncomplicated: Secondary | ICD-10-CM | POA: Insufficient documentation

## 2016-05-25 DIAGNOSIS — R51 Headache: Secondary | ICD-10-CM

## 2016-05-25 LAB — CBC
HCT: 39.5 % (ref 35.0–47.0)
HEMOGLOBIN: 13.7 g/dL (ref 12.0–16.0)
MCH: 32.5 pg (ref 26.0–34.0)
MCHC: 34.7 g/dL (ref 32.0–36.0)
MCV: 93.6 fL (ref 80.0–100.0)
PLATELETS: 200 10*3/uL (ref 150–440)
RBC: 4.22 MIL/uL (ref 3.80–5.20)
RDW: 15.4 % — ABNORMAL HIGH (ref 11.5–14.5)
WBC: 7.9 10*3/uL (ref 3.6–11.0)

## 2016-05-25 LAB — URINALYSIS, COMPLETE (UACMP) WITH MICROSCOPIC
Bilirubin Urine: NEGATIVE
GLUCOSE, UA: NEGATIVE mg/dL
HGB URINE DIPSTICK: NEGATIVE
Ketones, ur: NEGATIVE mg/dL
Leukocytes, UA: NEGATIVE
NITRITE: NEGATIVE
PH: 7 (ref 5.0–8.0)
Protein, ur: NEGATIVE mg/dL
Specific Gravity, Urine: 1.011 (ref 1.005–1.030)

## 2016-05-25 LAB — BASIC METABOLIC PANEL
ANION GAP: 9 (ref 5–15)
BUN: 8 mg/dL (ref 6–20)
CALCIUM: 9 mg/dL (ref 8.9–10.3)
CO2: 23 mmol/L (ref 22–32)
CREATININE: 0.6 mg/dL (ref 0.44–1.00)
Chloride: 105 mmol/L (ref 101–111)
Glucose, Bld: 136 mg/dL — ABNORMAL HIGH (ref 65–99)
Potassium: 3.8 mmol/L (ref 3.5–5.1)
Sodium: 137 mmol/L (ref 135–145)

## 2016-05-25 MED ORDER — SODIUM CHLORIDE 0.9 % IV BOLUS (SEPSIS)
1000.0000 mL | Freq: Once | INTRAVENOUS | Status: AC
  Administered 2016-05-25: 1000 mL via INTRAVENOUS

## 2016-05-25 MED ORDER — ONDANSETRON HCL 4 MG/2ML IJ SOLN
4.0000 mg | Freq: Once | INTRAMUSCULAR | Status: AC
  Administered 2016-05-25: 4 mg via INTRAVENOUS
  Filled 2016-05-25: qty 2

## 2016-05-25 MED ORDER — AMOXICILLIN-POT CLAVULANATE 875-125 MG PO TABS
1.0000 | ORAL_TABLET | Freq: Once | ORAL | Status: AC
Start: 2016-05-25 — End: 2016-05-25
  Administered 2016-05-25: 1 via ORAL
  Filled 2016-05-25: qty 1

## 2016-05-25 MED ORDER — DIPHENHYDRAMINE HCL 50 MG/ML IJ SOLN
25.0000 mg | Freq: Once | INTRAMUSCULAR | Status: AC
  Administered 2016-05-25: 25 mg via INTRAVENOUS
  Filled 2016-05-25: qty 1

## 2016-05-25 MED ORDER — AMOXICILLIN-POT CLAVULANATE 875-125 MG PO TABS
1.0000 | ORAL_TABLET | Freq: Two times a day (BID) | ORAL | 0 refills | Status: AC
Start: 2016-05-25 — End: 2016-06-04

## 2016-05-25 NOTE — ED Triage Notes (Signed)
C/O feeling dizzy and near syncopal while at work this morning.  Patient works at the [employer] as a Occupational psychologist.  Has had history of similar symptoms in the past.  Has history of DM and CAD, where coronary arteries are narrow and unable to be stented or bypassed.  Patient is prescribed to take NTG BID and SL prn.  Patient states she has been out of NTG "for a while".  CBG per EMS 149>

## 2016-05-25 NOTE — ED Notes (Addendum)
Pt given urine cup and informed urine was needed. Pt given warm blanket

## 2016-05-25 NOTE — ED Provider Notes (Signed)
[facility] Emergency Department Provider Note ____________________________________________   I have reviewed the triage vital signs and the triage nursing note.  HISTORY  Chief Complaint Dizziness and Near Syncope   Historian Patient  HPI Krista Alvarez is a 46 y.o. 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 She also had a gradual headache. She's had migraines in the past and thinks that she might be developing migraine. She's had some sinus congestion. No fever. No vision changes. No confusion or altered mental status. She did not actually pass out. No chest pain or palpitations. No coughing or trouble breathing.  Nothing seems to make it worse or better..    Past Medical History:  Diagnosis Date  . Anxiety   . Arthritis    "hips" (08/30/2014)  . Asthma   . Chronic lower back pain   . COPD (chronic obstructive pulmonary disease) (HCC)   . Coronary artery disease   . Depression   . GERD (gastroesophageal reflux disease)   . Hypercholesterolemia   . Hypertension   . Migraine    "a few times/month" (08/30/2014)  . NSTEMI (non-ST elevated myocardial infarction) (HCC) 08/2013   [name] 09/17/2013  . Pneumonia "several times"  . Type II diabetes mellitus [facility])     Patient Active Problem List   Diagnosis Date Noted  . Coronary artery disease involving native coronary artery of native heart with angina pectoris (HCC)   . Type 2 diabetes mellitus without complication (HCC)   . Tobacco abuse   . COLD (chronic obstructive lung disease) (HCC)   . Pain in the chest   . Chest pain 08/30/2014  . TOBACCO DEPENDENCE 03/19/2006  . PARESTHESIA NOS 03/19/2006    Past Surgical History:  Procedure Laterality Date  . CARDIAC CATHETERIZATION    . CARDIAC CATHETERIZATION N/A 08/31/2014   Procedure: Left Heart Cath and Coronary Angiography;   Surgeon: Kathleene Hazel, MD;  Location: [facility] INVASIVE CV LAB;  Service: Cardiovascular;  Laterality: N/A;  . CORONARY ANGIOPLASTY    . PERCUTANEOUS PINNING PHALANX FRACTURE OF HAND Left    "plate & pins"  . TONSILLECTOMY      Prior to Admission medications   Medication Sig Start Date End Date Taking? Authorizing Provider  atorvastatin (LIPITOR) 80 MG tablet Take 80 mg by mouth daily.   Yes [provider]  clindamycin (CLEOCIN) 150 MG capsule Take 2 capsules (300 mg total) by mouth 3 (three) times daily. May dispense as 150mg  capsules 05/19/16  Yes Harris, Abigail, PA-C  clopidogrel (PLAVIX) 75 MG tablet Take 75 mg by mouth daily.   Yes [provider]  Ipratropium-Albuterol (COMBIVENT RESPIMAT) 20-100 MCG/ACT AERS respimat Inhale 1 puff into the lungs 4 (four) times daily.    Yes [provider]  metFORMIN (GLUCOPHAGE) 500 MG tablet Take 500 mg by mouth 2 (two) times daily with a meal.   Yes [provider]  metoprolol tartrate (LOPRESSOR) 25 MG tablet Take 25 mg by mouth 2 (two) times daily.   Yes [provider]  traZODone (DESYREL) 50 MG tablet Take 50 mg by mouth at bedtime.   Yes [provider]  amoxicillin-clavulanate (AUGMENTIN) 875-125 MG tablet Take 1 tablet by mouth 2 (two) times daily. 05/25/16 06/04/16  Governor Rooks, MD  aspirin EC 81 MG tablet Take 81 mg by mouth daily.    [provider]  nitroGLYCERIN (NITROSTAT) 0.4 MG SL tablet Place 0.4 mg under the tongue every 5 (five) minutes as needed for chest pain.    [provider]  oxyCODONE-acetaminophen (PERCOCET) 5-325 MG tablet Take 1-2 tablets by mouth every 6 (six) hours as needed for severe pain. Patient not taking: Reported on 05/25/2016 05/19/16   Arthor Captain, PA-C    Allergies  Allergen Reactions  . Hydrocodone-Acetaminophen Itching  . Ibuprofen     "interacted with meds" per pt's family    No family history on file.  Social  History Social History  Substance Use Topics  . Smoking status: Current Every Day Smoker    Packs/day: 1.00    Years: 30.00    Types: Cigarettes  . Smokeless tobacco: Never Used  . Alcohol use Yes     Comment: 08/30/2014 "I drink a couple times/month"    Review of Systems  Constitutional: Negative for fever. Eyes: Negative for visual changes. ENT: Negative for sore throat. Cardiovascular: Negative for chest pain. Respiratory: Negative for shortness of breath. Gastrointestinal: Negative for abdominal pain, vomiting and diarrhea. Genitourinary: Negative for dysuria. Musculoskeletal: Negative for back pain. Skin: Negative for rash. Neurological: Positive for headache. 10 point Review of Systems otherwise negative ____________________________________________   PHYSICAL EXAM:  VITAL SIGNS: ED Triage Vitals  Enc Vitals Group     BP 05/25/16 1032 133/82     Pulse Rate 05/25/16 1032 (!) 59     Resp 05/25/16 1032 15     Temp 05/25/16 1032 98.2 F (36.8 C)     Temp Source 05/25/16 1032 Oral     SpO2 05/25/16 1032 98 %     Weight 05/25/16 1026 150 lb (68 kg)     Height 05/25/16 1026 5\' 2"  (1.575 m)     Head Circumference --      Peak Flow --      Pain Score 05/25/16 1025 0     Pain Loc --      Pain Edu? --      Excl. in GC? --      Constitutional: Alert and oriented. Well appearing and in no distress. HEENT   Head: Normocephalic and atraumatic.      Eyes: Conjunctivae are normal. PERRL. Normal extraocular movements.      Ears:         Nose: No congestion/rhinnorhea.   Mouth/Throat: Mucous membranes are moist.   Neck: No stridor. Cardiovascular/Chest: Normal rate, regular rhythm.  No murmurs, rubs, or gallops. Respiratory: Normal respiratory effort without tachypnea nor retractions. Breath sounds are clear and equal bilaterally. No wheezes/rales/rhonchi. Gastrointestinal: Soft. No distention, no guarding, no rebound. Nontender.     Genitourinary/rectal:Deferred Musculoskeletal: Nontender with normal range of motion in all extremities. No joint effusions.  No lower extremity tenderness.  No edema. Neurologic:  Normal speech and language. No gross or focal neurologic deficits are appreciated. Skin:  Skin is warm, dry and intact. No rash noted. Psychiatric: Mood and affect are normal. Speech and behavior are normal. Patient exhibits appropriate insight and judgment.   ____________________________________________  LABS (pertinent positives/negatives)  Labs Reviewed  BASIC METABOLIC PANEL - Abnormal; Notable for the following:       Result Value   Glucose, Bld 136 (*)    All other components within normal limits  CBC - Abnormal; Notable for the following:    RDW 15.4 (*)    All other components within normal limits  URINALYSIS, COMPLETE (UACMP) WITH MICROSCOPIC - Abnormal; Notable for the following:  Color, Urine YELLOW (*)    APPearance HAZY (*)    Bacteria, UA RARE (*)    Squamous Epithelial / LPF 0-5 (*)    All other components within normal limits  URINE CULTURE  CBG MONITORING, ED    ____________________________________________    EKG I, Governor Rooks, MD, the attending physician have personally viewed and interpreted all ECGs.  61 bpm. Normal sinus rhythm. Narrow QRS. Normal axis. Normal ST and T-wave ____________________________________________  RADIOLOGY All Xrays were viewed by me. Imaging interpreted by Radiologist.  CT head noncon: IMPRESSION: No evidence of intracranial abnormality.  Sphenoid and ethmoid sinus fluid compatible with acute sinusitis. __________________________________________  PROCEDURES  Procedure(s) performed: None  Critical Care performed: None  ____________________________________________   ED COURSE / ASSESSMENT AND PLAN  Pertinent labs & imaging results that were available during my care of the patient were reviewed by me and considered in my medical  decision making (see chart for details).   Near syncope reported without symptoms that sound cardiac in nature. She is having a headache now which is global and mild to moderate and feels like prior migraine.  On exam she is having no neurologic deficits. Heart and lung exam are also reassuring.  Laboratory studies are reassuring.  Head CT shows acute sinusitis which may be the result with her the headache as well as the near syncope. She is going to be started on Augmentin.   CONSULTATIONS:   None   Patient / Family / Caregiver informed of clinical course, medical decision-making process, and agree with plan.   I discussed return precautions, follow-up instructions, and discharge instructions with patient and/or family.  Discharge instructions:  You were evaluated for headache and nearly passing out, and your being treated for sinusitis with antibiotic Augmentin.  The rest of your exam and evaluation are reassuring in the emergency department today. Return to the emergency department immediately for any new or worsening condition including confusion or altered mental status, chest pain, dizziness, passing out, weakness, numbness, or any other symptoms concerning to you.    ___________________________________________   FINAL CLINICAL IMPRESSION(S) / ED DIAGNOSES   Final diagnoses:  Sphenoid sinusitis, unspecified chronicity  Ethmoid sinusitis, unspecified chronicity  Headache, unspecified headache type  Near syncope              Note: This dictation was prepared with Dragon dictation. Any transcriptional errors that result from this process are unintentional    Governor Rooks, MD 05/25/16 1320

## 2016-05-25 NOTE — Discharge Instructions (Signed)
You were evaluated for headache and nearly passing out, and your being treated for sinusitis with antibiotic Augmentin.  The rest of your exam and evaluation are reassuring in the emergency department today. Return to the emergency department immediately for any new or worsening condition including confusion or altered mental status, chest pain, dizziness, passing out, weakness, numbness, or any other symptoms concerning to you.

## 2016-05-25 NOTE — ED Notes (Signed)
Pt in CT.

## 2016-05-27 LAB — URINE CULTURE

## 2016-07-23 ENCOUNTER — Encounter (HOSPITAL_COMMUNITY): Payer: Self-pay | Admitting: Emergency Medicine

## 2016-07-23 ENCOUNTER — Inpatient Hospital Stay (HOSPITAL_COMMUNITY)
Admission: EM | Disposition: A | Discharge status: Home / Self Care | Payer: Self-pay | Source: Home / Self Care | Attending: Interventional Cardiology

## 2016-07-23 ENCOUNTER — Inpatient Hospital Stay (HOSPITAL_COMMUNITY)
Admission: EM | Admit: 2016-07-23 | Discharge: 2016-07-25 | DRG: 247 | Disposition: A | Discharge status: Home / Self Care | Payer: Self-pay | Attending: Interventional Cardiology | Admitting: Interventional Cardiology

## 2016-07-23 ENCOUNTER — Emergency Department (HOSPITAL_COMMUNITY): Payer: Self-pay

## 2016-07-23 DIAGNOSIS — Z7902 Long term (current) use of antithrombotics/antiplatelets: Secondary | ICD-10-CM

## 2016-07-23 DIAGNOSIS — Z8249 Family history of ischemic heart disease and other diseases of the circulatory system: Secondary | ICD-10-CM

## 2016-07-23 DIAGNOSIS — Z955 Presence of coronary angioplasty implant and graft: Secondary | ICD-10-CM

## 2016-07-23 DIAGNOSIS — R001 Bradycardia, unspecified: Secondary | ICD-10-CM | POA: Diagnosis present

## 2016-07-23 DIAGNOSIS — J449 Chronic obstructive pulmonary disease, unspecified: Secondary | ICD-10-CM | POA: Diagnosis present

## 2016-07-23 DIAGNOSIS — I25119 Atherosclerotic heart disease of native coronary artery with unspecified angina pectoris: Secondary | ICD-10-CM | POA: Diagnosis present

## 2016-07-23 DIAGNOSIS — Z7982 Long term (current) use of aspirin: Secondary | ICD-10-CM

## 2016-07-23 DIAGNOSIS — I2119 ST elevation (STEMI) myocardial infarction involving other coronary artery of inferior wall: Secondary | ICD-10-CM | POA: Diagnosis present

## 2016-07-23 DIAGNOSIS — I252 Old myocardial infarction: Secondary | ICD-10-CM

## 2016-07-23 DIAGNOSIS — R079 Chest pain, unspecified: Secondary | ICD-10-CM | POA: Diagnosis present

## 2016-07-23 DIAGNOSIS — E119 Type 2 diabetes mellitus without complications: Secondary | ICD-10-CM | POA: Diagnosis present

## 2016-07-23 DIAGNOSIS — E118 Type 2 diabetes mellitus with unspecified complications: Secondary | ICD-10-CM

## 2016-07-23 DIAGNOSIS — E782 Mixed hyperlipidemia: Secondary | ICD-10-CM | POA: Diagnosis present

## 2016-07-23 DIAGNOSIS — Z79899 Other long term (current) drug therapy: Secondary | ICD-10-CM

## 2016-07-23 DIAGNOSIS — I213 ST elevation (STEMI) myocardial infarction of unspecified site: Secondary | ICD-10-CM

## 2016-07-23 DIAGNOSIS — I2111 ST elevation (STEMI) myocardial infarction involving right coronary artery: Principal | ICD-10-CM | POA: Diagnosis present

## 2016-07-23 DIAGNOSIS — F17210 Nicotine dependence, cigarettes, uncomplicated: Secondary | ICD-10-CM | POA: Diagnosis present

## 2016-07-23 DIAGNOSIS — E785 Hyperlipidemia, unspecified: Secondary | ICD-10-CM

## 2016-07-23 DIAGNOSIS — K219 Gastro-esophageal reflux disease without esophagitis: Secondary | ICD-10-CM | POA: Diagnosis present

## 2016-07-23 DIAGNOSIS — I10 Essential (primary) hypertension: Secondary | ICD-10-CM | POA: Diagnosis present

## 2016-07-23 DIAGNOSIS — Z720 Tobacco use: Secondary | ICD-10-CM | POA: Diagnosis present

## 2016-07-23 DIAGNOSIS — I2510 Atherosclerotic heart disease of native coronary artery without angina pectoris: Secondary | ICD-10-CM | POA: Diagnosis present

## 2016-07-23 DIAGNOSIS — Z7984 Long term (current) use of oral hypoglycemic drugs: Secondary | ICD-10-CM

## 2016-07-23 HISTORY — PX: LEFT HEART CATH AND CORONARY ANGIOGRAPHY: CATH118249

## 2016-07-23 HISTORY — PX: CORONARY/GRAFT ACUTE MI REVASCULARIZATION: CATH118305

## 2016-07-23 LAB — CBC
HEMATOCRIT: 42 % (ref 36.0–46.0)
HEMOGLOBIN: 14.6 g/dL (ref 12.0–15.0)
MCH: 33.3 pg (ref 26.0–34.0)
MCHC: 34.8 g/dL (ref 30.0–36.0)
MCV: 95.7 fL (ref 78.0–100.0)
Platelets: 217 10*3/uL (ref 150–400)
RBC: 4.39 MIL/uL (ref 3.87–5.11)
RDW: 14.9 % (ref 11.5–15.5)
WBC: 13.8 10*3/uL — ABNORMAL HIGH (ref 4.0–10.5)

## 2016-07-23 LAB — BASIC METABOLIC PANEL
ANION GAP: 13 (ref 5–15)
BUN: 13 mg/dL (ref 6–20)
CHLORIDE: 106 mmol/L (ref 101–111)
CO2: 19 mmol/L — ABNORMAL LOW (ref 22–32)
Calcium: 9.2 mg/dL (ref 8.9–10.3)
Creatinine, Ser: 0.64 mg/dL (ref 0.44–1.00)
GFR calc Af Amer: >60 mL/min (ref >60)
GLUCOSE: 164 mg/dL — AB (ref 65–99)
POTASSIUM: 3.5 mmol/L (ref 3.5–5.1)
Sodium: 138 mmol/L (ref 135–145)

## 2016-07-23 LAB — APTT: aPTT: 26 seconds (ref 24–36)

## 2016-07-23 LAB — I-STAT BETA HCG BLOOD, ED (MC, WL, AP ONLY): I-stat hCG, quantitative: <5 m[IU]/mL (ref <5)

## 2016-07-23 LAB — LIPID PANEL
CHOL/HDL RATIO: 4.4 ratio
CHOLESTEROL: 179 mg/dL (ref 0–200)
HDL: 41 mg/dL (ref >40)
LDL Cholesterol: 77 mg/dL (ref 0–99)
TRIGLYCERIDES: 304 mg/dL — AB (ref <150)
VLDL: 61 mg/dL — AB (ref 0–40)

## 2016-07-23 LAB — TROPONIN I: Troponin I: 0.09 ng/mL (ref <0.03)

## 2016-07-23 LAB — PROTIME-INR
INR: 0.98
PROTHROMBIN TIME: 12.9 s (ref 11.4–15.2)

## 2016-07-23 LAB — I-STAT TROPONIN, ED: Troponin i, poc: 0.1 ng/mL (ref 0.00–0.08)

## 2016-07-23 SURGERY — LEFT HEART CATH AND CORONARY ANGIOGRAPHY
Anesthesia: LOCAL

## 2016-07-23 MED ORDER — NITROGLYCERIN 1 MG/10 ML FOR IR/CATH LAB
INTRA_ARTERIAL | Status: DC | PRN
  Administered 2016-07-23 (×3): 200 ug via INTRACORONARY

## 2016-07-23 MED ORDER — LIDOCAINE HCL (PF) 1 % IJ SOLN
INTRAMUSCULAR | Status: AC
  Filled 2016-07-23: qty 30

## 2016-07-23 MED ORDER — SODIUM CHLORIDE 0.9% FLUSH
3.0000 mL | Freq: Two times a day (BID) | INTRAVENOUS | Status: DC
  Administered 2016-07-24 – 2016-07-25 (×3): 3 mL via INTRAVENOUS

## 2016-07-23 MED ORDER — TRAZODONE HCL 50 MG PO TABS
50.0000 mg | ORAL_TABLET | Freq: Every day | ORAL | Status: DC
  Administered 2016-07-23 – 2016-07-24 (×2): 50 mg via ORAL
  Filled 2016-07-23 (×2): qty 1

## 2016-07-23 MED ORDER — ASPIRIN 81 MG PO CHEW
81.0000 mg | CHEWABLE_TABLET | Freq: Every day | ORAL | Status: DC
  Administered 2016-07-24 – 2016-07-25 (×2): 81 mg via ORAL
  Filled 2016-07-23 (×2): qty 1

## 2016-07-23 MED ORDER — ONDANSETRON HCL 4 MG/2ML IJ SOLN
4.0000 mg | Freq: Four times a day (QID) | INTRAMUSCULAR | Status: DC | PRN
  Filled 2016-07-23: qty 2

## 2016-07-23 MED ORDER — BIVALIRUDIN BOLUS VIA INFUSION - CUPID
INTRAVENOUS | Status: DC | PRN
  Administered 2016-07-23: 51 mg via INTRAVENOUS

## 2016-07-23 MED ORDER — SODIUM CHLORIDE 0.9 % IV SOLN: 250.0000 mL | INTRAVENOUS | Status: DC | PRN

## 2016-07-23 MED ORDER — FENTANYL CITRATE (PF) 100 MCG/2ML IJ SOLN
INTRAMUSCULAR | Status: DC | PRN
  Administered 2016-07-23 (×2): 25 ug via INTRAVENOUS

## 2016-07-23 MED ORDER — TICAGRELOR 90 MG PO TABS
ORAL_TABLET | ORAL | Status: DC | PRN
  Administered 2016-07-23: 180 mg via ORAL

## 2016-07-23 MED ORDER — NITROGLYCERIN 0.4 MG SL SUBL: 0.4000 mg | SUBLINGUAL_TABLET | SUBLINGUAL | Status: DC | PRN

## 2016-07-23 MED ORDER — TICAGRELOR 90 MG PO TABS
90.0000 mg | ORAL_TABLET | Freq: Two times a day (BID) | ORAL | Status: DC
  Administered 2016-07-24 – 2016-07-25 (×3): 90 mg via ORAL
  Filled 2016-07-23 (×3): qty 1

## 2016-07-23 MED ORDER — IPRATROPIUM-ALBUTEROL 0.5-2.5 (3) MG/3ML IN SOLN
3.0000 mL | Freq: Four times a day (QID) | RESPIRATORY_TRACT | Status: DC
  Administered 2016-07-24 – 2016-07-25 (×5): 3 mL via RESPIRATORY_TRACT
  Filled 2016-07-23 (×5): qty 3

## 2016-07-23 MED ORDER — SODIUM CHLORIDE 0.9 % IV SOLN
INTRAVENOUS | Status: AC | PRN
  Administered 2016-07-23: 1.75 mg/kg/h via INTRAVENOUS

## 2016-07-23 MED ORDER — SODIUM CHLORIDE 0.9 % IV SOLN: INTRAVENOUS | Status: DC

## 2016-07-23 MED ORDER — MIDAZOLAM HCL 2 MG/2ML IJ SOLN
INTRAMUSCULAR | Status: AC
  Filled 2016-07-23: qty 2

## 2016-07-23 MED ORDER — BIVALIRUDIN TRIFLUOROACETATE 250 MG IV SOLR
INTRAVENOUS | Status: AC
  Filled 2016-07-23: qty 250

## 2016-07-23 MED ORDER — LIDOCAINE HCL (PF) 1 % IJ SOLN
INTRAMUSCULAR | Status: DC | PRN
  Administered 2016-07-23: 20 mL
  Administered 2016-07-23: 5 mL

## 2016-07-23 MED ORDER — FENTANYL CITRATE (PF) 100 MCG/2ML IJ SOLN
50.0000 ug | Freq: Once | INTRAMUSCULAR | Status: AC
  Administered 2016-07-23: 50 ug via INTRAVENOUS
  Filled 2016-07-23: qty 2

## 2016-07-23 MED ORDER — ONDANSETRON HCL 4 MG/2ML IJ SOLN
4.0000 mg | Freq: Once | INTRAMUSCULAR | Status: AC
  Administered 2016-07-23: 4 mg via INTRAVENOUS

## 2016-07-23 MED ORDER — HEPARIN SODIUM (PORCINE) 5000 UNIT/ML IJ SOLN
4000.0000 [IU] | Freq: Once | INTRAMUSCULAR | Status: AC
  Administered 2016-07-23: 4000 [IU] via INTRAVENOUS
  Filled 2016-07-23: qty 1

## 2016-07-23 MED ORDER — HYDRALAZINE HCL 20 MG/ML IJ SOLN: 5.0000 mg | INTRAMUSCULAR | Status: DC | PRN

## 2016-07-23 MED ORDER — ONDANSETRON HCL 4 MG/2ML IJ SOLN
INTRAMUSCULAR | Status: AC
  Filled 2016-07-23: qty 2

## 2016-07-23 MED ORDER — FENTANYL CITRATE (PF) 100 MCG/2ML IJ SOLN
75.0000 ug | Freq: Once | INTRAMUSCULAR | Status: AC
  Administered 2016-07-23: 75 ug via INTRAVENOUS
  Filled 2016-07-23: qty 2

## 2016-07-23 MED ORDER — HEPARIN (PORCINE) IN NACL 2-0.9 UNIT/ML-% IJ SOLN
INTRAMUSCULAR | Status: AC
  Filled 2016-07-23: qty 1000

## 2016-07-23 MED ORDER — ONDANSETRON HCL 4 MG/2ML IJ SOLN: 4.0000 mg | Freq: Once | INTRAMUSCULAR | Status: DC

## 2016-07-23 MED ORDER — TICAGRELOR 90 MG PO TABS
ORAL_TABLET | ORAL | Status: AC
  Filled 2016-07-23: qty 2

## 2016-07-23 MED ORDER — ASPIRIN 81 MG PO CHEW
324.0000 mg | CHEWABLE_TABLET | Freq: Once | ORAL | Status: AC
  Administered 2016-07-23: 324 mg via ORAL
  Filled 2016-07-23: qty 4

## 2016-07-23 MED ORDER — VERAPAMIL HCL 2.5 MG/ML IV SOLN
INTRAVENOUS | Status: AC
  Filled 2016-07-23: qty 2

## 2016-07-23 MED ORDER — IOPAMIDOL (ISOVUE-370) INJECTION 76%
INTRAVENOUS | Status: DC | PRN
  Administered 2016-07-23: 125 mL via INTRA_ARTERIAL

## 2016-07-23 MED ORDER — IOPAMIDOL (ISOVUE-370) INJECTION 76%
INTRAVENOUS | Status: AC
  Filled 2016-07-23: qty 125

## 2016-07-23 MED ORDER — ACETAMINOPHEN 325 MG PO TABS
650.0000 mg | ORAL_TABLET | ORAL | Status: DC | PRN
  Administered 2016-07-24 (×4): 650 mg via ORAL
  Filled 2016-07-23 (×4): qty 2

## 2016-07-23 MED ORDER — ASPIRIN EC 81 MG PO TBEC: 81.0000 mg | DELAYED_RELEASE_TABLET | Freq: Every day | ORAL | Status: DC

## 2016-07-23 MED ORDER — ATORVASTATIN CALCIUM 80 MG PO TABS
80.0000 mg | ORAL_TABLET | Freq: Every day | ORAL | Status: DC
  Administered 2016-07-24 – 2016-07-25 (×2): 80 mg via ORAL
  Filled 2016-07-23 (×2): qty 1

## 2016-07-23 MED ORDER — SODIUM CHLORIDE 0.9% FLUSH: 3.0000 mL | INTRAVENOUS | Status: DC | PRN

## 2016-07-23 MED ORDER — FENTANYL CITRATE (PF) 100 MCG/2ML IJ SOLN: 50.0000 ug | Freq: Once | INTRAMUSCULAR | Status: DC

## 2016-07-23 MED ORDER — SODIUM CHLORIDE 0.9 % IV SOLN
INTRAVENOUS | Status: DC
  Administered 2016-07-23: 20 mL/h via INTRAVENOUS

## 2016-07-23 MED ORDER — METOPROLOL TARTRATE 12.5 MG HALF TABLET
12.5000 mg | ORAL_TABLET | Freq: Two times a day (BID) | ORAL | Status: DC
  Administered 2016-07-23 – 2016-07-25 (×3): 12.5 mg via ORAL
  Filled 2016-07-23 (×4): qty 1

## 2016-07-23 MED ORDER — NITROGLYCERIN 1 MG/10 ML FOR IR/CATH LAB
INTRA_ARTERIAL | Status: AC
  Filled 2016-07-23: qty 10

## 2016-07-23 MED ORDER — FENTANYL CITRATE (PF) 100 MCG/2ML IJ SOLN
INTRAMUSCULAR | Status: AC
  Filled 2016-07-23: qty 2

## 2016-07-23 MED ORDER — METOPROLOL TARTRATE 12.5 MG HALF TABLET: 25.0000 mg | ORAL_TABLET | Freq: Two times a day (BID) | ORAL | Status: DC

## 2016-07-23 MED ORDER — MIDAZOLAM HCL 2 MG/2ML IJ SOLN
INTRAMUSCULAR | Status: DC | PRN
  Administered 2016-07-23 (×2): 1 mg via INTRAVENOUS

## 2016-07-23 MED ORDER — HEPARIN (PORCINE) IN NACL 2-0.9 UNIT/ML-% IJ SOLN
INTRAMUSCULAR | Status: AC | PRN
Start: 2016-07-23 — End: 2016-07-23
  Administered 2016-07-23: 1000 mL

## 2016-07-23 MED ORDER — LABETALOL HCL 5 MG/ML IV SOLN: 10.0000 mg | INTRAVENOUS | Status: DC | PRN

## 2016-07-23 SURGICAL SUPPLY — 23 items
BALLN SAPPHIRE 2.5X15 (BALLOONS) ×2
BALLN ~~LOC~~ EMERGE MR 3.25X20 (BALLOONS) ×2
BALLOON SAPPHIRE 2.5X15 (BALLOONS) IMPLANT
BALLOON ~~LOC~~ EMERGE MR 3.25X20 (BALLOONS) IMPLANT
CATH EXTRAC PRONTO LP 6F RND (CATHETERS) ×1 IMPLANT
CATH INFINITI 5 FR JL3.5 (CATHETERS) ×1 IMPLANT
CATH INFINITI 5FR ANG PIGTAIL (CATHETERS) ×1 IMPLANT
CATH VISTA GUIDE 6FR JR4 (CATHETERS) ×1 IMPLANT
COVER PRB 48X5XTLSCP FOLD TPE (BAG) IMPLANT
COVER PROBE 5X48 (BAG) ×2
GLIDESHEATH SLEND SS 6F .021 (SHEATH) ×1 IMPLANT
GUIDEWIRE INQWIRE 1.5J.035X260 (WIRE) IMPLANT
INQWIRE 1.5J .035X260CM (WIRE) ×2
KIT ENCORE 26 ADVANTAGE (KITS) ×1 IMPLANT
KIT HEART LEFT (KITS) ×2 IMPLANT
PACK CARDIAC CATHETERIZATION (CUSTOM PROCEDURE TRAY) ×2 IMPLANT
SHEATH PINNACLE 6F 10CM (SHEATH) ×2 IMPLANT
STENT SYNERGY DES 3X38 (Permanent Stent) ×1 IMPLANT
SYR MEDRAD MARK V 150ML (SYRINGE) ×2 IMPLANT
TRANSDUCER W/STOPCOCK (MISCELLANEOUS) ×2 IMPLANT
TUBING CIL FLEX 10 FLL-RA (TUBING) ×2 IMPLANT
VALVE GUARDIAN II ~~LOC~~ HEMO (MISCELLANEOUS) ×1 IMPLANT
WIRE ASAHI PROWATER 180CM (WIRE) ×2 IMPLANT

## 2016-07-23 NOTE — Progress Notes (Signed)
Chaplain responded to STEMI.  Family member was bedside, not especially engaged with patient, who was exhibiting significant pain and stress.  Chaplain offered support and orientation to process. Please call if f/u is requested.  Theodoro Parma [phone]    07/23/16 2000  Clinical Encounter Type  Visited With Patient and family together  Visit Type Initial  Referral From Care management  Consult/Referral To Chaplain  Stress Factors  Patient Stress Factors (Pain)  Family Stress Factors Not reviewed

## 2016-07-23 NOTE — ED Notes (Signed)
Code Stemi activated @ 2022

## 2016-07-23 NOTE — ED Notes (Signed)
Taken to cath lab at this time.

## 2016-07-23 NOTE — ED Provider Notes (Signed)
[facility]-EMER[facility]ENCY DEPT Provider Note   CSN: [id number] Arrival date & time: 07/23/16  2007     History   Chief Complaint Chief Complaint  Patient presents with  . Chest Pain    HPI Krista Alvarez is a 46 y.o. female.  HPI 46 yo F with PMHx as below including CAD here with CP. Pt was at grocery store around 6 PM when she developed initially mild then increasingly severe aching, throbbing, pressure like chest pain. Her pain has progressively worsened since onset and is now severe with nausea and SOB. Radiates to left shoulder. She has severe SOB. Of note, she has been out of her plavix medication. No h/o GIB or recent trauma.   Past Medical History:  Diagnosis Date  . Anxiety   . Arthritis    "hips" (08/30/2014)  . Asthma   . Chronic lower back pain   . COPD (chronic obstructive pulmonary disease) (HCC)   . Coronary artery disease   . Depression   . GERD (gastroesophageal reflux disease)   . Hypercholesterolemia   . Hypertension   . Migraine    "a few times/month" (08/30/2014)  . NSTEMI (non-ST elevated myocardial infarction) (HCC) 08/2013   [name] 09/17/2013  . Pneumonia "several times"  . Type II diabetes mellitus [facility])     Patient Active Problem List   Diagnosis Date Noted  . Coronary artery disease involving native coronary artery of native heart with angina pectoris (HCC)   . Type 2 diabetes mellitus without complication (HCC)   . Tobacco abuse   . COLD (chronic obstructive lung disease) (HCC)   . Pain in the chest   . Chest pain 08/30/2014  . TOBACCO DEPENDENCE 03/19/2006  . PARESTHESIA NOS 03/19/2006    Past Surgical History:  Procedure Laterality Date  . CARDIAC CATHETERIZATION    . CARDIAC CATHETERIZATION N/A 08/31/2014   Procedure: Left Heart Cath and Coronary Angiography;  Surgeon: Kathleene Hazel, MD;  Location: [facility] INVASIVE CV LAB;  Service: Cardiovascular;  Laterality: N/A;  . CORONARY ANGIOPLASTY    . PERCUTANEOUS PINNING PHALANX FRACTURE OF HAND  Left    "plate & pins"  . TONSILLECTOMY      OB History    No data available       Home Medications    Prior to Admission medications   Medication Sig Start Date End Date Taking? Authorizing Provider  aspirin EC 81 MG tablet Take 81 mg by mouth daily.   Yes [provider]  atorvastatin (LIPITOR) 80 MG tablet Take 80 mg by mouth daily.   Yes [provider]  clopidogrel (PLAVIX) 75 MG tablet Take 75 mg by mouth daily.   Yes [provider]  Ipratropium-Albuterol (COMBIVENT RESPIMAT) 20-100 MCG/ACT AERS respimat Inhale 1 puff into the lungs 4 (four) times daily.    Yes [provider]  metFORMIN (GLUCOPHAGE) 500 MG tablet Take 500 mg by mouth 2 (two) times daily with a meal.   Yes [provider]  metoprolol tartrate (LOPRESSOR) 25 MG tablet Take 25 mg by mouth 2 (two) times daily.   Yes [provider]  nitroGLYCERIN (NITROSTAT) 0.4 MG SL tablet Place 0.4 mg under the tongue every 5 (five) minutes as needed for chest pain.   Yes [provider]  traZODone (DESYREL) 50 MG tablet Take 50 mg by mouth at bedtime.   Yes [provider]  clindamycin (CLEOCIN) 150 MG capsule Take 2 capsules (300 mg total) by mouth 3 (three) times daily. May  dispense as 150mg  capsules Patient not taking: Reported on 07/23/2016 05/19/16   Arthor Captain, PA-C  oxyCODONE-acetaminophen (PERCOCET) 5-325 MG tablet Take 1-2 tablets by mouth every 6 (six) hours as needed for severe pain. Patient not taking: Reported on 05/25/2016 05/19/16   Arthor Captain, PA-C    Family History History reviewed. No pertinent family history.  Social History Social History  Substance Use Topics  . Smoking status: Current Every Day Smoker    Packs/day: 1.00    Years: 30.00    Types: Cigarettes  . Smokeless tobacco: Never Used  . Alcohol use Yes     Comment: 08/30/2014 "I drink a couple times/month"     Allergies   Hydrocodone-acetaminophen and  Ibuprofen   Review of Systems Review of Systems  Constitutional: Positive for fatigue.  Respiratory: Positive for cough and shortness of breath.   Cardiovascular: Positive for chest pain.  Gastrointestinal: Positive for nausea.  All other systems reviewed and are negative.    Physical Exam Updated Vital Signs BP (!) 154/107 (BP Location: Right Arm)   Pulse 70   Temp 97.8 F (36.6 C) (Oral)   Ht 5\' 2"  (1.575 m)   Wt 68 kg (150 lb)   SpO2 98%   BMI 27.44 kg/m   Physical Exam  Constitutional: She is oriented to person, place, and time. She appears well-developed and well-nourished. She appears distressed.  HENT:  Head: Normocephalic and atraumatic.  Eyes: Conjunctivae are normal.  Neck: Neck supple.  Cardiovascular: Normal rate, regular rhythm and normal heart sounds.  Exam reveals no friction rub.   No murmur heard. Pulmonary/Chest: Effort normal. No respiratory distress. She has no wheezes. She has rhonchi in the right lower field and the left lower field. She has rales in the right lower field and the left lower field.  Abdominal: She exhibits no distension.  Musculoskeletal: She exhibits no edema.  Neurological: She is alert and oriented to person, place, and time. She exhibits normal muscle tone.  Skin: Skin is warm. Capillary refill takes less than 2 seconds. She is diaphoretic.  Psychiatric: She has a normal mood and affect.  Nursing note and vitals reviewed.    ED Treatments / Results  Labs (all labs ordered are listed, but only abnormal results are displayed) Labs Reviewed  I-STAT TROPOININ, ED - Abnormal; Notable for the following:       Result Value   Troponin i, poc 0.10 (*)    All other components within normal limits  BASIC METABOLIC PANEL  CBC  PROTIME-INR  APTT  TROPONIN I  LIPID PANEL  I-STAT BETA HCG BLOOD, ED ([facility], [facility], [facility] ONLY)    EKG  EKG Interpretation  Date/Time:  Wednesday July 23 2016 20:22:34 EDT Ventricular Rate:  77 PR  Interval:  160 QRS Duration: 90 QT Interval:  409 QTC Calculation: 463 R Axis:   64 Text Interpretation:  POSTERIOR EKG INTERPRETATION ST elevation in V7, 8, 9 is concerning for POSTERIOR STEMI Confirmed by Shaune Pollack [phone]) on 07/23/2016 8:40:36 PM       Radiology No results found.  Procedures .Critical Care Performed by: Shaune Pollack Authorized by: Shaune Pollack     (including critical care time)  CRITICAL CARE Performed by: Dollene Cleveland   Total critical care time: 35 minutes  Critical care time was exclusive of separately billable procedures and treating other patients.  Critical care was necessary to treat or prevent imminent or life-threatening deterioration.  Critical care was time spent personally by me on  the following activities: development of treatment plan with patient and/or surrogate as well as nursing, discussions with consultants, evaluation of patient's response to treatment, examination of patient, obtaining history from patient or surrogate, ordering and performing treatments and interventions, ordering and review of laboratory studies, ordering and review of radiographic studies, pulse oximetry and re-evaluation of patient's condition.    Medications Ordered in ED Medications  0.9 %  sodium chloride infusion (20 mL/hr Intravenous New Bag/Given 07/23/16 2033)  ondansetron (ZOFRAN) 4 MG/2ML injection (not administered)  fentaNYL (SUBLIMAZE) injection 75 mcg (not administered)  aspirin chewable tablet 324 mg (324 mg Oral Given 07/23/16 2029)  heparin injection 4,000 Units (4,000 Units Intravenous Given 07/23/16 2030)  ondansetron (ZOFRAN) injection 4 mg (4 mg Intravenous Given 07/23/16 2034)  fentaNYL (SUBLIMAZE) injection 50 mcg (50 mcg Intravenous Given 07/23/16 2031)     Initial Impression / Assessment and Plan / ED Course  I have reviewed the triage vital signs and the nursing notes.  Pertinent labs & imaging results that were available during  my care of the patient were reviewed by me and considered in my medical decision making (see chart for details).    46 yo F with known CAD here with concerning chest pain. EKG on arrival shows STEMI. EKG handed to Dr. Dalene Seltzer who notified me of EKG at 20:21. CODE STEMI activated and secretary confirmed page was out. Pt given ASA, heparin 4000 units, and fentanyl. Hold on nitro 2/2 concern for posterior/inferior MI.  Pain improving on fentanyl, taken to cath lab for emergent PCI. CXR largely clear with normal mediastinum, pain not c/w dissection. No s/s PE. Trop 0.1 c/w ischemia.    Final Clinical Impressions(s) / ED Diagnoses   Final diagnoses:  None    New Prescriptions New Prescriptions   No medications on file     Shaune Pollack, MD 07/23/16 2120

## 2016-07-23 NOTE — H&P (Signed)
CARDIOLOGY INPATIENT HISTORY AND PHYSICAL EXAMINATION NOTE  Patient ID: Krista Alvarez MRN: [id number], DOB/AGE: Aug 28, 1970   Admit date: 07/23/2016   Primary Physician: Margaretann Loveless, MD Primary Cardiologist: Dr. Elease Hashimoto  Reason for admission: chest pain   HPI: This is a 46 y.o.white female with NSTEMI s/p mod CAD on cath in 2016 treated with DAPT, diabetes, ongoing cig smoking (45 pack years), hypertension, hyperlipidemia, strong family history of coronary artery disease who presented with chest pain. Patient was in his usual state of health when she started having substernal CP @ 7 pm today, pressure like, 10/10 in intensity, w/o radiation, associated with dyspnea and diaphoresis similar to 2016 pain. Pt was out of clopidogrel for >1 mo. Pt has prior episodes of CP off and on but would resolve with NTG. This one did not resolve.  On arrival to ED, hemodynamically stable, was given Aspirin 324, IV heparin. No NTG due to concern for inferior STEMI on EKG. Fentanyl 50 mcg improved CP to 2/10. Cath lab was activated emergently.    Problem List: Past Medical History:  Diagnosis Date  . Anxiety   . Arthritis    "hips" (08/30/2014)  . Asthma   . Chronic lower back pain   . COPD (chronic obstructive pulmonary disease) (HCC)   . Coronary artery disease   . Depression   . GERD (gastroesophageal reflux disease)   . Hypercholesterolemia   . Hypertension   . Migraine    "a few times/month" (08/30/2014)  . NSTEMI (non-ST elevated myocardial infarction) (HCC) 08/2013   [name] 09/17/2013  . Pneumonia "several times"  . Type II diabetes mellitus (HCC)     Past Surgical History:  Procedure Laterality Date  . CARDIAC CATHETERIZATION    . CARDIAC CATHETERIZATION N/A 08/31/2014   Procedure: Left Heart Cath and Coronary Angiography;  Surgeon: Kathleene Hazel, MD;  Location: [facility] INVASIVE CV LAB;  Service: Cardiovascular;  Laterality: N/A;  . CORONARY ANGIOPLASTY    . PERCUTANEOUS PINNING  PHALANX FRACTURE OF HAND Left    "plate & pins"  . TONSILLECTOMY       Allergies:  Allergies  Allergen Reactions  . Hydrocodone-Acetaminophen Itching  . Ibuprofen     "interacted with meds" per pt's family     Home Medications Current Facility-Administered Medications  Medication Dose Route Frequency Provider Last Rate Last Dose  . 0.9 %  sodium chloride infusion   Intravenous Continuous Shaune Pollack, MD 20 mL/hr at 07/23/16 2033 20 mL/hr at 07/23/16 2033  . bivalirudin (ANGIOMAX) BOLUS via infusion    PRN Corky Crafts, MD   51 mg at 07/23/16 2125  . fentaNYL (SUBLIMAZE) injection    PRN Corky Crafts, MD   25 mcg at 07/23/16 2123  . heparin infusion 2 units/mL in 0.9 % sodium chloride    Continuous PRN Corky Crafts, MD   1,000 mL at 07/23/16 2114  . lidocaine (PF) (XYLOCAINE) 1 % injection    PRN Corky Crafts, MD   5 mL at 07/23/16 2114  . midazolam (VERSED) injection    PRN Corky Crafts, MD   1 mg at 07/23/16 2123  . ondansetron (ZOFRAN) 4 MG/2ML injection              Family History  Problem Relation Age of Onset  . Heart attack Mother   . Heart attack Father      Social History   Social History  . Marital status: Married    Spouse name:  N/A  . Number of children: N/A  . Years of education: N/A   Occupational History  . Not on file.   Social History Main Topics  . Smoking status: Current Every Day Smoker    Packs/day: 1.00    Years: 30.00    Types: Cigarettes  . Smokeless tobacco: Never Used  . Alcohol use Yes     Comment: 08/30/2014 "I drink a couple times/month"  . Drug use: No     Comment: "back in my younger days"  . Sexual activity: Yes   Other Topics Concern  . Not on file   Social History Narrative  . No narrative on file     Review of Systems: General: negative for chills, fever, night sweats or weight changes.  Cardiovascular: chest pain, dyspnea negative for dyspnea on exertion, edema, orthopnea,  palpitations, paroxysmal nocturnal dyspnea  Dermatological: negative for rash Respiratory: negative for cough or wheezing Urologic: negative for hematuria Abdominal: negative for nausea, vomiting, diarrhea, bright red blood per rectum, melena, or hematemesis Neurologic: negative for visual changes, syncope, or dizziness Endocrine: no diabetes, no hypothyroidism Immunological: no lymph adenopathy Psych: non homicidal/suicidal  Physical Exam: Vitals: BP (!) 151/94   Pulse 70   Temp 97.8 F (36.6 C) (Oral)   Resp 16   Ht 5\' 2"  (1.575 m)   Wt 68 kg (150 lb)   SpO2 98%   BMI 27.44 kg/m  General: not in acute distress Neck: JVP flat, neck supple Heart: regular rate and rhythm, S1, S2, no murmurs  Lungs: CTAB  GI: non tender, non distended, bowel sounds present Extremities: no edema Neuro: AAO x 3  Psych: normal affect, no anxiety   Labs:   Results for orders placed or performed during the hospital encounter of 07/23/16 (from the past 24 hour(s))  Basic metabolic panel     Status: Abnormal   Collection Time: 07/23/16  8:26 PM  Result Value Ref Range   Sodium 138 135 - 145 mmol/L   Potassium 3.5 3.5 - 5.1 mmol/L   Chloride 106 101 - 111 mmol/L   CO2 19 (L) 22 - 32 mmol/L   Glucose, Bld 164 (H) 65 - 99 mg/dL   BUN 13 6 - 20 mg/dL   Creatinine, Ser 1.61 0.44 - 1.00 mg/dL   Calcium 9.2 8.9 - 09.6 mg/dL   GFR calc non Af Amer >60 >60 mL/min   GFR calc Af Amer >60 >60 mL/min   Anion gap 13 5 - 15  CBC     Status: Abnormal   Collection Time: 07/23/16  8:26 PM  Result Value Ref Range   WBC 13.8 (H) 4.0 - 10.5 K/uL   RBC 4.39 3.87 - 5.11 MIL/uL   Hemoglobin 14.6 12.0 - 15.0 g/dL   HCT 04.5 40.9 - 81.1 %   MCV 95.7 78.0 - 100.0 fL   MCH 33.3 26.0 - 34.0 pg   MCHC 34.8 30.0 - 36.0 g/dL   RDW 91.4 78.2 - 95.6 %   Platelets 217 150 - 400 K/uL  Protime-INR     Status: None   Collection Time: 07/23/16  8:26 PM  Result Value Ref Range   Prothrombin Time 12.9 11.4 - 15.2  seconds   INR 0.98   APTT     Status: None   Collection Time: 07/23/16  8:26 PM  Result Value Ref Range   aPTT 26 24 - 36 seconds  Troponin I     Status: Abnormal   Collection Time:  07/23/16  8:26 PM  Result Value Ref Range   Troponin I 0.09 (HH) <0.03 ng/mL  I-stat troponin, ED     Status: Abnormal   Collection Time: 07/23/16  8:28 PM  Result Value Ref Range   Troponin i, poc 0.10 (HH) 0.00 - 0.08 ng/mL   Comment NOTIFIED PHYSICIAN    Comment 3          I-Stat beta hCG blood, ED     Status: None   Collection Time: 07/23/16  8:28 PM  Result Value Ref Range   I-stat hCG, quantitative <5.0 <5 mIU/mL   Comment 3             Radiology/Studies: Dg Chest Port 1 View  Result Date: 07/23/2016 CLINICAL DATA:  Chest pain.  ST-elevation MI. EXAM: PORTABLE CHEST 1 VIEW COMPARISON:  None. FINDINGS: Low lung volumes with bibasilar atelectasis. The cardiomediastinal contours are normal. Pulmonary vasculature is normal. No consolidation, pleural effusion, or pneumothorax. No acute osseous abnormalities are seen. IMPRESSION: Low lung volumes with mild bibasilar atelectasis. Electronically Signed   By: Rubye Oaks M.D.   On: 07/23/2016 20:44    EKG: normal sinus rhythm, inferoposterior STEMI   Echo: pending  Cardiac cath: mod diz, 40% mLCx, 40% Om2 normal LV function  Medical decision making:  Discussed care with the patient Discussed care with the physician on the phone Reviewed labs and imaging personally Reviewed prior records  ASSESSMENT AND PLAN:  This is a 46 y.o. female with mod CAD, HTN, HLD, DM2, ongoing tobacco use presented with chest pain and inferoposterior STEMI on ECG.    Principal Problem:   STEMI involving right coronary artery (HCC) Active Problems:   TOBACCO DEPENDENCE   Chest pain   Coronary artery disease involving native coronary artery of native heart with angina pectoris (HCC)   Type 2 diabetes mellitus without complication (HCC)   Tobacco  abuse  Inferoposterior STEMI of RCA 1. Emergent cardiac catheterization and PCI 2. Risk stratify with HbA1c, lipid profile, TSH 3. Echocardiogram in the morning 4. Aspirin, antiplatelet, high dose statin and beta blocker if tolerated 5. Tobacco cessation counseling provided 6. Medication compliance emphasized to the patient  Tobacco use  Counseled for smoking cessation  Diabetes mellitus without complication SSI, finger sticks No oral hypoglycemics HbA1c  Hypertension, essential Continue home blood pressure meds Goal <130/90  Hyperlipidemia, mixed Lipid panel ordered High dose statin    Signed, Joellyn Rued, MD MS 07/23/2016, 9:28 PM    I have examined the patient and reviewed assessment and plan and discussed with patient.  Agree with above as stated.  I personally reviewed the ECG and made a decision bring her for emergent cardiac cath. She had severe disease in her mid RCA. There was moderate disease in the proximal and distal RCA. There was diffuse, severe disease noted in a very small circumflex. The LAD had mild disease. She had inferior hypokinesis and moderately decreased LV function. She needs aggressive risk factor modification including diabetes control and tobacco cessation. I spoke to the family in the husband informs me that they now live in [place] spell. He prefers that she gets follow-up in [place]. The patient is seen Dr. Elease Hashimoto at her previous MI.  We'll have to double check with the patient where she wants to follow-up. She saw Dr. Welton Flakes in [place].  A synergy stent was placed since the patient was not compliant with her aspirin and Plavix, at least at the time of admission. She has  been out of her medicines for at least a week. Hopefully, she can maintain her dual antiplatelet therapy for several months to minimize the risk of stent thrombosis.  Lance Muss

## 2016-07-23 NOTE — ED Triage Notes (Signed)
Patient arrives with complaint of sudden onset CP 1 hour ago. History of MI with 3 vessel disease. States she takes long acting nitrates daily. Usually if she has chest pain is comes and goes quickly.

## 2016-07-24 ENCOUNTER — Encounter (HOSPITAL_COMMUNITY): Payer: Self-pay | Admitting: Interventional Cardiology

## 2016-07-24 ENCOUNTER — Inpatient Hospital Stay (HOSPITAL_COMMUNITY): Payer: Self-pay

## 2016-07-24 DIAGNOSIS — R9431 Abnormal electrocardiogram [ECG] [EKG]: Secondary | ICD-10-CM

## 2016-07-24 LAB — CK TOTAL AND CKMB (NOT AT ARMC)
CK TOTAL: 149 U/L (ref 38–234)
CK TOTAL: 486 U/L — AB (ref 38–234)
CK TOTAL: 555 U/L — AB (ref 38–234)
CK, MB: 18.5 ng/mL — ABNORMAL HIGH (ref 0.5–5.0)
CK, MB: 85 ng/mL — AB (ref 0.5–5.0)
CK, MB: 106.8 ng/mL — AB (ref 0.5–5.0)
RELATIVE INDEX: 12.4 — AB (ref 0.0–2.5)
Relative Index: 17.5 — ABNORMAL HIGH (ref 0.0–2.5)
Relative Index: 19.2 — ABNORMAL HIGH (ref 0.0–2.5)

## 2016-07-24 LAB — BASIC METABOLIC PANEL
Anion gap: 6 (ref 5–15)
BUN: 12 mg/dL (ref 6–20)
CHLORIDE: 107 mmol/L (ref 101–111)
CO2: 22 mmol/L (ref 22–32)
CREATININE: 0.57 mg/dL (ref 0.44–1.00)
Calcium: 8.5 mg/dL — ABNORMAL LOW (ref 8.9–10.3)
GFR calc non Af Amer: >60 mL/min (ref >60)
Glucose, Bld: 118 mg/dL — ABNORMAL HIGH (ref 65–99)
POTASSIUM: 3.5 mmol/L (ref 3.5–5.1)
Sodium: 135 mmol/L (ref 135–145)

## 2016-07-24 LAB — CBC
HEMATOCRIT: 37.1 % (ref 36.0–46.0)
Hemoglobin: 12.4 g/dL (ref 12.0–15.0)
MCH: 32.5 pg (ref 26.0–34.0)
MCHC: 33.4 g/dL (ref 30.0–36.0)
MCV: 97.1 fL (ref 78.0–100.0)
PLATELETS: 180 10*3/uL (ref 150–400)
RBC: 3.82 MIL/uL — AB (ref 3.87–5.11)
RDW: 15 % (ref 11.5–15.5)
WBC: 10.6 10*3/uL — ABNORMAL HIGH (ref 4.0–10.5)

## 2016-07-24 LAB — GLUCOSE, CAPILLARY
GLUCOSE-CAPILLARY: 111 mg/dL — AB (ref 65–99)
Glucose-Capillary: 141 mg/dL — ABNORMAL HIGH (ref 65–99)
Glucose-Capillary: 179 mg/dL — ABNORMAL HIGH (ref 65–99)
Glucose-Capillary: 152 mg/dL — ABNORMAL HIGH (ref 65–99)

## 2016-07-24 LAB — ECHOCARDIOGRAM COMPLETE
HEIGHTINCHES: 62 in
Weight: 2278.67 oz

## 2016-07-24 LAB — TROPONIN I: Troponin I: 4.78 ng/mL (ref <0.03)

## 2016-07-24 LAB — MRSA PCR SCREENING: MRSA by PCR: NEGATIVE

## 2016-07-24 MED ORDER — PNEUMOCOCCAL VAC POLYVALENT 25 MCG/0.5ML IJ INJ
0.5000 mL | INJECTION | INTRAMUSCULAR | Status: DC
  Filled 2016-07-24: qty 0.5

## 2016-07-24 MED ORDER — OMEGA-3-ACID ETHYL ESTERS 1 G PO CAPS
1.0000 g | ORAL_CAPSULE | Freq: Two times a day (BID) | ORAL | Status: DC
  Administered 2016-07-24 – 2016-07-25 (×3): 1 g via ORAL
  Filled 2016-07-24 (×3): qty 1

## 2016-07-24 MED ORDER — INSULIN ASPART 100 UNIT/ML ~~LOC~~ SOLN
0.0000 [IU] | Freq: Three times a day (TID) | SUBCUTANEOUS | Status: DC
  Administered 2016-07-24: 2 [IU] via SUBCUTANEOUS
  Administered 2016-07-24: 3 [IU] via SUBCUTANEOUS

## 2016-07-24 MED ORDER — ATROPINE SULFATE 1 MG/10ML IJ SOSY
PREFILLED_SYRINGE | INTRAMUSCULAR | Status: AC
  Filled 2016-07-24: qty 10

## 2016-07-24 MED FILL — Verapamil HCl IV Soln 2.5 MG/ML: INTRAVENOUS | Qty: 4 | Status: AC

## 2016-07-24 NOTE — Discharge Summary (Signed)
Discharge Summary    Patient ID: Krista Alvarez,  MRN: [id number], DOB/AGE: 1970/09/21 46 y.o.  Admit date: 07/23/2016 Discharge date: 07/25/2016  Primary Care Provider: Margaretann Loveless Primary Cardiologist: Nahser  Discharge Diagnoses    Principal Problem:   STEMI involving right coronary artery [facility]) Active Problems:   Acute inferoposterior myocardial infarction [facility])   Coronary artery disease involving native coronary artery of native heart with angina pectoris (HCC)   Chest pain   Type 2 diabetes mellitus without complication (HCC)   Tobacco abuse   Hyperlipidemia   Hypertension   Allergies Allergies  Allergen Reactions  . Hydrocodone-Acetaminophen Itching  . Ibuprofen     "interacted with meds" per pt's family    Diagnostic Studies/Procedures   LHC: 07/23/16  Conclusion     Small, circumflex system. Diffusely diseased obtuse marginal vessels.  Mild LAD disease, but mid to distal vessel is quite small.  Dist LAD lesion, 30 %stenosed.  Prox LAD lesion, 30 %stenosed.  Mid RCA lesion, 99 %stenosed.  A STENT SYNERGY DES [id number] drug eluting stent was successfully placed, postdilated to 3.3 mm.  Post intervention, there is a 0% residual stenosis.  The left ventricular ejection fraction is 35-45% by visual estimate, with inferior hypokinesis.  There is moderate left ventricular systolic dysfunction.  LV end diastolic pressure is moderately elevated.  There is no aortic valve stenosis.   Continue IV Angiomax until current bag runs out. She was loaded with Brilinta during the catheterization.  She will need aggressive secondary prevention including smoking cessation and diabetes control.   Synergy drug-eluting stent was chosen since there've been some issues with compliance with her dual antiplatelet therapy in the past.    TTE: 07/24/16  Study Conclusions  - Left ventricle: Mid and basal inferior wall hypokinesis The   cavity size was normal. Wall  thickness was normal. Systolic   function was normal. The estimated ejection fraction was in the   range of 50% to 55%. Left ventricular diastolic function   parameters were normal. - Atrial septum: No defect or patent foramen ovale was identified. _____________   History of Present Illness     47 y.o.white female with NSTEMI s/p mod CAD on cath in 2016 treated with DAPT,diabetes, ongoing cig smoking (45 pack years), hypertension, hyperlipidemia, strong family history of coronary artery disease who presented with chest pain. Patient was in her usual state of health when she started having substernal CP a7 7 pm on the day of admission, pressure like, 10/10 in intensity, w/o radiation, associated with dyspnea and diaphoresis similar to 2016 pain. Pt was out of clopidogrel for >1 mo. Pt has had prior episodes of CP off and on but would resolve with NTG. This one did not resolve.  On arrival to ED, hemodynamically stable, was given Aspirin 324, IV heparin. No NTG due to concern for inferior STEMI on EKG. Fentanyl 50 mcg improved CP to 2/10. Cath lab was activated emergently.   Hospital Course     Underwent LHC with Dr. Eldridge Dace noted above with PCI and DES x1 placed to [facility]. Plan for DAPT with ASA/Brilinta for at least one year with aggressive risk factor modifications. Instructed on the need for smoking cessation. Follow up echo showed low normal EF of 50-55% and inferior wall hypokinesis. Post cath labs showed Cr 0.57, Hgb 12.4. LDL 77, Hgb A1c 5.3. She worked well with cardiac rehab. No further chest pain. Home metoprolol reduced to 12.5mg  BID, from 25mg  BID due  to bradycardia.   She was seen by Dr. Clifton James and determined stable for discharge home. Follow up in the office has been arranged. Of note has seen Dr. Welton Flakes in the past, but prefers to follow with [place]. Medications are listed below.   General: Well developed, well nourished, female appearing in no acute distress. Head: Normocephalic,  atraumatic.  Neck: Supple without bruits, JVD. Lungs:  Resp regular and unlabored, CTA. Heart: RRR, S1, S2, no S3, S4, or murmur; no rub. Abdomen: Soft, non-tender, non-distended with normoactive bowel sounds. No hepatomegaly. No rebound/guarding. No obvious abdominal masses. Extremities: No clubbing, cyanosis, edema. Distal pedal pulses are 2+ bilaterally. R radial/femoral cath site stable without bruising or hematoma. Neuro: Alert and oriented X 3. Moves all extremities spontaneously. Psych: Normal affect.  _____________  Discharge Vitals Blood pressure 113/73, pulse (!) 58, temperature 98 F (36.7 C), temperature source Oral, resp. rate 19, height 5\' 2"  (1.575 m), weight 142 lb 14.4 oz (64.8 kg), SpO2 99 %.  Filed Weights   07/23/16 2230 07/24/16 1746 07/25/16 0631  Weight: 142 lb 6.7 oz (64.6 kg) 143 lb 8 oz (65.1 kg) 142 lb 14.4 oz (64.8 kg)    Labs & Radiologic Studies    CBC  Recent Labs  07/24/16 0453 07/25/16 0425  WBC 10.6* 7.7  HGB 12.4 12.1  HCT 37.1 36.7  MCV 97.1 98.1  PLT 180 137*   Basic Metabolic Panel  Recent Labs  07/24/16 0453 07/25/16 0425  NA 135 138  K 3.5 3.7  CL 107 110  CO2 22 21*  GLUCOSE 118* 118*  BUN 12 10  CREATININE 0.57 0.59  CALCIUM 8.5* 8.6*   Liver Function Tests No results for input(s): AST, ALT, ALKPHOS, BILITOT, PROT, ALBUMIN in the last 72 hours. No results for input(s): LIPASE, AMYLASE in the last 72 hours. Cardiac Enzymes  Recent Labs  07/23/16 2026 07/23/16 2315 07/24/16 0453 07/24/16 1234  CKTOTAL  --  149 486* 555*  CKMB  --  18.5* 85.0* 106.8*  TROPONINI 0.09*  --  4.78*  --    BNP Invalid input(s): POCBNP D-Dimer No results for input(s): DDIMER in the last 72 hours. Hemoglobin A1C  Recent Labs  07/24/16 0453  HGBA1C 5.3   Fasting Lipid Panel  Recent Labs  07/23/16 2026  CHOL 179  HDL 41  LDLCALC 77  TRIG 304*  CHOLHDL 4.4   Thyroid Function Tests No results for input(s): TSH, T4TOTAL,  T3FREE, THYROIDAB in the last 72 hours.  Invalid input(s): FREET3 _____________  Dg Chest Port 1 View  Result Date: 07/23/2016 CLINICAL DATA:  Chest pain.  ST-elevation MI. EXAM: PORTABLE CHEST 1 VIEW COMPARISON:  None. FINDINGS: Low lung volumes with bibasilar atelectasis. The cardiomediastinal contours are normal. Pulmonary vasculature is normal. No consolidation, pleural effusion, or pneumothorax. No acute osseous abnormalities are seen. IMPRESSION: Low lung volumes with mild bibasilar atelectasis. Electronically Signed   By: Rubye Oaks M.D.   On: 07/23/2016 20:44   Disposition   Pt is being discharged home today in good condition.  Follow-up Plans & Appointments    Follow-up Information    Beatrice Lecher, PA-C Follow up on 08/01/2016.   Specialties:  Cardiology, Physician Assistant Why:  at 8:15am for your follow up appt. Please arrive by 8am to check in.  Contact information: [address] [place] [address] [phone]           Discharge Medications   Current Discharge Medication List  START taking these medications   Details  omega-3 acid ethyl esters (LOVAZA) 1 g capsule Take 1 capsule (1 g total) by mouth 2 (two) times daily. Qty: 60 capsule, Refills: 2    ticagrelor (BRILINTA) 90 MG TABS tablet Take 1 tablet (90 mg total) by mouth 2 (two) times daily. Qty: 60 tablet, Refills: 0      CONTINUE these medications which have CHANGED   Details  aspirin EC 81 MG tablet Take 1 tablet (81 mg total) by mouth daily.    metoprolol tartrate (LOPRESSOR) 25 MG tablet Take 0.5 tablets (12.5 mg total) by mouth 2 (two) times daily. Qty: 30 tablet, Refills: 1      CONTINUE these medications which have NOT CHANGED   Details  atorvastatin (LIPITOR) 80 MG tablet Take 80 mg by mouth daily.    Ipratropium-Albuterol (COMBIVENT RESPIMAT) 20-100 MCG/ACT AERS respimat Inhale 1 puff into the lungs 4 (four) times daily.     metFORMIN (GLUCOPHAGE) 500  MG tablet Take 500 mg by mouth 2 (two) times daily with a meal.    nitroGLYCERIN (NITROSTAT) 0.4 MG SL tablet Place 0.4 mg under the tongue every 5 (five) minutes as needed for chest pain.    traZODone (DESYREL) 50 MG tablet Take 50 mg by mouth at bedtime.      STOP taking these medications     clopidogrel (PLAVIX) 75 MG tablet      clindamycin (CLEOCIN) 150 MG capsule      oxyCODONE-acetaminophen (PERCOCET) 5-325 MG tablet          Aspirin prescribed at discharge?  Yes High Intensity Statin Prescribed? (Lipitor 40-80mg  or Crestor 20-40mg ): Yes Beta Blocker Prescribed? Yes For EF <40%, was ACEI/ARB Prescribed? No: EF ok, blood pressure soft ADP Receptor Inhibitor Prescribed? (i.e. Plavix etc.-Includes Medically Managed Patients): Yes For EF <40%, Aldosterone Inhibitor Prescribed? No, EF ok Was EF assessed during THIS hospitalization? Yes Was Cardiac Rehab II ordered? (Included Medically managed Patients): Yes   Outstanding Labs/Studies   N/A  Duration of Discharge Encounter   Greater than 30 minutes including physician time.  Signed, Laverda Page NP-C 07/25/2016, 10:09 AM   I have personally seen and examined this patient with Laverda Page, NP. I agree with the assessment and plan as outlined above. She is s/p STEMI with occluded RCA. A DES was placed in the mid RCA. LV function is normal. My exam shows a WDWN female in NAD. CV:RRR. Pulm:clear bilaterally. Ext: no edema.  Will need one year of DAPT with ASA/Brilinta/statin/beta blocker. Discharge home today. Follow up with Dr. Elease Hashimoto.   Verne Carrow 07/25/2016 10:20 AM

## 2016-07-24 NOTE — Progress Notes (Signed)
Pulled arterial sheath at 0100 and venous sheath at 0105 with Owens Loffler, RN.  Held pressure for 30 minutes.  Dressed with guaze and pressure tape.  Pt. Tolerated well and hemodynamically stable during sheath pulls.  Site is a level 0.

## 2016-07-24 NOTE — Progress Notes (Signed)
Progress Note  Patient Name: Krista Alvarez Date of Encounter: 07/24/2016  Primary Cardiologist: Nahser  Subjective   No chest pain, some nausea. Feeling much better.   Inpatient Medications    Scheduled Meds: . aspirin  81 mg Oral Daily  . atorvastatin  80 mg Oral Daily  . ipratropium-albuterol  3 mL Inhalation QID  . metoprolol tartrate  12.5 mg Oral BID  . ondansetron      . sodium chloride flush  3 mL Intravenous Q12H  . ticagrelor  90 mg Oral BID  . traZODone  50 mg Oral QHS   Continuous Infusions: . sodium chloride 20 mL/hr (07/23/16 2033)  . sodium chloride     PRN Meds: sodium chloride, acetaminophen, nitroGLYCERIN, ondansetron (ZOFRAN) IV, sodium chloride flush   Vital Signs    Vitals:   07/24/16 0400 07/24/16 0500 07/24/16 0600 07/24/16 0700  BP: 105/72 92/64 108/75 99/70  Pulse: (!) 55 (!) 54  (!) 52  Resp: (!) 25 15 (!) 23 20  Temp:      TempSrc:      SpO2: 96% 97%  96%  Weight:      Height:        Intake/Output Summary (Last 24 hours) at 07/24/16 0717 Last data filed at 07/24/16 0600  Gross per 24 hour  Intake            642.7 ml  Output              450 ml  Net            192.7 ml   Filed Weights   07/23/16 2014 07/23/16 2230  Weight: 150 lb (68 kg) 142 lb 6.7 oz (64.6 kg)    Telemetry    SB with short runs of NSVT - Personally Reviewed  ECG    SB with evolving ST changes in inferior leads - Personally Reviewed  Physical Exam   General: Well developed, well nourished, female appears older than stated age, in no acute distress. Head: Normocephalic, atraumatic.  Neck: Supple without bruits, JVD. Lungs:  Resp regular and unlabored, Expiratory wheezing. Heart: RRR, S1, S2, no S3, S4, or murmur; no rub. Abdomen: Soft, non-tender, non-distended with normoactive bowel sounds. No hepatomegaly. No rebound/guarding. No obvious abdominal masses. Extremities: No clubbing, cyanosis, edema. Distal pedal pulses are 2+ bilaterally. Right  radial cath site stable.  Neuro: Alert and oriented X 3. Moves all extremities spontaneously. Psych: Normal affect.  Labs    Chemistry Recent Labs Lab 07/23/16 2026 07/24/16 0453  NA 138 135  K 3.5 3.5  CL 106 107  CO2 19* 22  GLUCOSE 164* 118*  BUN 13 12  CREATININE 0.64 0.57  CALCIUM 9.2 8.5*  GFRNONAA >60 >60  GFRAA >60 >60  ANIONGAP 13 6     Hematology Recent Labs Lab 07/23/16 2026 07/24/16 0453  WBC 13.8* 10.6*  RBC 4.39 3.82*  HGB 14.6 12.4  HCT 42.0 37.1  MCV 95.7 97.1  MCH 33.3 32.5  MCHC 34.8 33.4  RDW 14.9 15.0  PLT 217 180    Cardiac Enzymes Recent Labs Lab 07/23/16 2026 07/24/16 0453  TROPONINI 0.09* 4.78*    Recent Labs Lab 07/23/16 2028  TROPIPOC 0.10*     BNPNo results for input(s): BNP, PROBNP in the last 168 hours.   DDimer No results for input(s): DDIMER in the last 168 hours.    Radiology    Dg Chest Port 1 View  Result Date: 07/23/2016 CLINICAL DATA:  Chest pain.  ST-elevation MI. EXAM: PORTABLE CHEST 1 VIEW COMPARISON:  None. FINDINGS: Low lung volumes with bibasilar atelectasis. The cardiomediastinal contours are normal. Pulmonary vasculature is normal. No consolidation, pleural effusion, or pneumothorax. No acute osseous abnormalities are seen. IMPRESSION: Low lung volumes with mild bibasilar atelectasis. Electronically Signed   By: Rubye Oaks M.D.   On: 07/23/2016 20:44    Cardiac Studies   LHC: 07/23/16  Conclusion     Small, circumflex system. Diffusely diseased obtuse marginal vessels.  Mild LAD disease, but mid to distal vessel is quite small.  Dist LAD lesion, 30 %stenosed.  Prox LAD lesion, 30 %stenosed.  Mid RCA lesion, 99 %stenosed.  A STENT SYNERGY DES [id number] drug eluting stent was successfully placed, postdilated to 3.3 mm.  Post intervention, there is a 0% residual stenosis.  The left ventricular ejection fraction is 35-45% by visual estimate, with inferior hypokinesis.  There is moderate  left ventricular systolic dysfunction.  LV end diastolic pressure is moderately elevated.  There is no aortic valve stenosis.   Continue IV Angiomax until current bag runs out. She was loaded with Brilinta during the catheterization.  She will need aggressive secondary prevention including smoking cessation and diabetes control.   Synergy drug-eluting stent was chosen since there've been some issues with compliance with her dual antiplatelet therapy in the past.   Patient Profile     46 y.o. female  with NSTEMI s/p mod CAD on cath in 2016 treated with DAPT,diabetes, ongoing cig smoking (45 pack years), hypertension, hyperlipidemia, strong family history of coronary artery disease who presented with chest pain and called STEMI in the field.   Assessment & Plan    1. Inferior STEMI: Underwent LHC with Dr. Eldridge Dace with DES x1 placed to 99% mRCA lesion. EF reported at 35-45% with inferior hypokinesis. LVEDP was mildly elevated. Trop peaked at 4.78. Continued on angiomax post cath. Loaded with Brilinta. Plan for DAPT with ASA/Brilinta.  -- continue ASA, Brilinta, Statin, BB -- echo pending for further assess EF.   2. DM: on SSI -- Hgb A1c  3. HL: mixed lipidemia, on high dose statin, add Lovaza 1gm BID -- LDL 77, Trig 304  4. HTN: Controlled.  -- unable to further titrate BB due to bradycardia  5. Tobacco Use: Cessation advised.   Dispo: Work with cardiac rehab, if tolerates well will plan for transfer to telemetry today, possibly home tomorrow.   Signed, Laverda Page, NP  07/24/2016, 7:17 AM    I have personally seen and examined this patient with Laverda Page,  I agree with the assessment and plan as outlined above. She was admitted with an inferior STEMI. DES placed in the mid RCA. EF around 40% by LV gram. Echo pending today. She is stable. No chest pain. Continue DAPT for one year with ASA and Brilinta. Continue high dose statin and beta blocker. Will add Ace-inh as BP  tolerates-not today. Transfer to telemetry.   Verne Carrow 07/24/2016 8:10 AM

## 2016-07-24 NOTE — Progress Notes (Signed)
CARDIAC REHAB PHASE I   PRE:  Rate/Rhythm: 52 SB     BP: sitting 108/73    SaO2: 98 RA  MODE:  Ambulation: 190 ft   POST:  Rate/Rhythm: 69 SR    BP: sitting 119/90     SaO2: 98 RA  Pt weak and sore getting out of bed and walking. She has significant congested cough, seems to be her baseline. Slow pace, exerted after 130 ft.  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 To recliner after walk. Began education, discussing MI, stent, Brilinta, restrictions, smoking cessation, and diet with pt and husband. She sts she normally takes her meds but she lost her insurance recently due to a company error. She sts she takes many meds. Husband questions if she can get disability. I discussed with her clearly her role in her health, especially quitting smoking and controlling DM. She does not seem ready to quit smoking or make great change with her diet. I encouraged her to read information and think about change. Will continue to reinforce tomorrow and to discuss ex, NTG, and CRPII. [id number]  Krista Alvarez CES, ACSM 07/24/2016 10:58 AM

## 2016-07-24 NOTE — Progress Notes (Signed)
  Echocardiogram 2D Echocardiogram has been performed.  Faryal Marxen L Androw 07/24/2016, 12:36 PM

## 2016-07-25 ENCOUNTER — Encounter (HOSPITAL_COMMUNITY): Payer: Self-pay | Admitting: Cardiology

## 2016-07-25 DIAGNOSIS — I10 Essential (primary) hypertension: Secondary | ICD-10-CM

## 2016-07-25 DIAGNOSIS — E785 Hyperlipidemia, unspecified: Secondary | ICD-10-CM

## 2016-07-25 LAB — CBC
HEMATOCRIT: 36.7 % (ref 36.0–46.0)
HEMOGLOBIN: 12.1 g/dL (ref 12.0–15.0)
MCH: 32.4 pg (ref 26.0–34.0)
MCHC: 33 g/dL (ref 30.0–36.0)
MCV: 98.1 fL (ref 78.0–100.0)
Platelets: 137 10*3/uL — ABNORMAL LOW (ref 150–400)
RBC: 3.74 MIL/uL — ABNORMAL LOW (ref 3.87–5.11)
RDW: 15.2 % (ref 11.5–15.5)
WBC: 7.7 10*3/uL (ref 4.0–10.5)

## 2016-07-25 LAB — BASIC METABOLIC PANEL
ANION GAP: 7 (ref 5–15)
BUN: 10 mg/dL (ref 6–20)
CHLORIDE: 110 mmol/L (ref 101–111)
CO2: 21 mmol/L — AB (ref 22–32)
Calcium: 8.6 mg/dL — ABNORMAL LOW (ref 8.9–10.3)
Creatinine, Ser: 0.59 mg/dL (ref 0.44–1.00)
GFR calc non Af Amer: >60 mL/min (ref >60)
Glucose, Bld: 118 mg/dL — ABNORMAL HIGH (ref 65–99)
Potassium: 3.7 mmol/L (ref 3.5–5.1)
Sodium: 138 mmol/L (ref 135–145)

## 2016-07-25 LAB — HEMOGLOBIN A1C
Hgb A1c MFr Bld: 5.3 % (ref 4.8–5.6)
Mean Plasma Glucose: 105 mg/dL

## 2016-07-25 LAB — POCT ACTIVATED CLOTTING TIME: ACTIVATED CLOTTING TIME: 472 s

## 2016-07-25 LAB — GLUCOSE, CAPILLARY: Glucose-Capillary: 118 mg/dL — ABNORMAL HIGH (ref 65–99)

## 2016-07-25 MED ORDER — METOPROLOL TARTRATE 25 MG PO TABS: 12.5000 mg | ORAL_TABLET | Freq: Two times a day (BID) | ORAL | 1 refills | Status: DC

## 2016-07-25 MED ORDER — OMEGA-3-ACID ETHYL ESTERS 1 G PO CAPS
1.0000 g | ORAL_CAPSULE | Freq: Two times a day (BID) | ORAL | 2 refills | Status: DC
Start: 2016-07-25 — End: 2019-03-01

## 2016-07-25 MED ORDER — TICAGRELOR 90 MG PO TABS: 90.0000 mg | ORAL_TABLET | Freq: Two times a day (BID) | ORAL | 0 refills | Status: DC

## 2016-07-25 MED ORDER — ASPIRIN EC 81 MG PO TBEC: 81.0000 mg | DELAYED_RELEASE_TABLET | Freq: Every day | ORAL | Status: DC

## 2016-07-25 MED ORDER — IPRATROPIUM-ALBUTEROL 0.5-2.5 (3) MG/3ML IN SOLN: 3.0000 mL | Freq: Two times a day (BID) | RESPIRATORY_TRACT | Status: DC

## 2016-07-25 MED FILL — **BRILINTA 90 MG TABLET: 90 | 30 days supply | Qty: 60 | Fill #0

## 2016-07-25 NOTE — Discharge Instructions (Signed)
Non-ST Segment Elevation Heart Attack A heart attack (myocardial infarction) happens when some of the heart muscle is injured or dies because it does not get enough oxygen. A non-ST segment elevation heart attack is a type of heart attack. It happens when the body does not get enough oxygen because an artery carrying blood to the heart muscles (coronary artery) becomes partly or temporarily blocked. This type of heart attack is usually less severe than the type of heart attack in which a coronary artery becomes completely blocked. What are the causes? The most common cause of this condition is a blocked coronary artery. A coronary artery can become blocked from a gradual buildup of cholesterol, fat, and plaque. A blood clot can form over the plaque and block blood flow. What increases the risk? This condition is more likely to develop in:  Smokers.  Males.  Older adults.  Overweight and obese adults.  People with high blood pressure (hypertension), high cholesterol, or diabetes.  People with a family history of heart disease.  People who do not get enough exercise.  People who are under a lot of stress.  People who drink too much alcohol.  People who use illegal street drugs that increase the heart rate, such as cocaine and methamphetamines.  What are the signs or symptoms? Symptoms of this condition include:  Chest pain or a feeling of pressure in the chest. It may feel like something is crushing or squeezing the chest.  Discomfort in the upper back or in the area between the shoulder blades.  Upper back pain.  Tingling in the hands and arms.  Shortness of breath.  Heartburn or indigestion.  Sudden cold sweats.  Unexplained sweating.  Sudden lightheadedness.  Unexplained feelings of nervousness or anxiety.  Feeling of tiredness, or not feeling well.  How is this diagnosed? This condition is diagnosed based on a person's signs and symptoms and a physical exam. You  may also have tests done, including:  Blood tests.  A chest X-ray.  An test to measure the electrical activity of the heart (electrocardiogram).  A test that uses sound waves to produce a picture of the heart (echocardiogram).  A test to look at the heart arteries (coronary angiogram).  If you are still having chest pain after 12-24 hours, or if your health care providers think your heart is at risk, you may have a procedure called cardiac catheterization. In this procedure, a long, thin tube is inserted into an artery in your groin and moved up to the arteries in your heart. This procedure helps your health care provider figure out the source of the problem. How is this treated? This condition may be treated with:  Bed rest in the hospital.  Medicines to relieve chest pain.  Medicines to protect the heart.  If you have a blockage, a procedure in which the artery is opened (angioplasty) and a stent is placed to keep the artery open.  After initial treatment you may need to take medicine to:  Keep your blood from clotting too easily.  Control your blood pressure.  Lower your cholesterol.  Control abnormal heart rhythms (arrhythmias).  Follow these instructions at home:  Take medicines only as directed by your health care provider.  Do not take the following medicines unless your health care provider approves: ? Nonsteroidal anti-inflammatory drugs (NSAIDs), such as ibuprofen, naproxen, or celecoxib. ? Vitamin supplements that contain vitamin A, vitamin E, or both. ? Hormone replacement therapy that contains estrogen with or without  progestin.  Make lifestyle changes as directed by your health care provider. These may include: ? Using no tobacco products, including cigarettes, chewing tobacco, and electronic cigarettes. If you are struggling to quit, ask your health care provider for help. ? Exercising as directed by your health care provider. Ask for a list of activities  that are safe for you. ? Eating a heart-healthy diet. Work with a Museum/gallery exhibitions officer to learn healthy eating options. ? Maintaining a healthy weight. ? Managing other medical conditions, like diabetes. ? Reducing stress. ? Limiting how much alcohol you drink as directed by your health care provider. Get help right away if:  You have any symptoms of this condition. This information is not intended to replace advice given to you by your health care provider. Make sure you discuss any questions you have with your health care provider. Document Released: 08/05/2004 Document Revised: 06/14/2015 Document Reviewed: 12/14/2013 Elsevier Interactive Patient Education  [employer].

## 2016-07-25 NOTE — Progress Notes (Signed)
CARDIAC REHAB PHASE I   PRE:  Rate/Rhythm: 57 SB    BP: sitting 124/89    SaO2:   MODE:  Ambulation: 470 ft   POST:  Rate/Rhythm: 67 SR    BP: sitting 115/83     SaO2:   Pt feeling some better today. Still c/o soreness in groin but able to walk longer. Not as exerted or coughing as much as yesterday. Pt is not planning on quitting smoking. A1C low. Gave ex gl, NTG, and CRPII education. Will refer to [place] CRPII. [id number]   Harriet Masson CES, ACSM 07/25/2016 11:06 AM

## 2016-07-25 NOTE — Care Management Note (Addendum)
Case Management Note  Patient Details  Name: Krista Alvarez MRN: [id number] Date of Birth: 03-11-1970  Subjective/Objective: Pt presented for [facility]- post cardiac cath-post Stent. Plan for home with husband on Brilinta. Pt is currently not working for the summer and she is without insurance. No PCP at this time.                  Action/Plan: CM did call the [facility] and they are able to see the patient. Hospital F/u scheduled and placed on the AVS. Pt will be able to utilize the [facility] and [facility] for medications that range from $4.00-$10.00. Pt usually uses [facility] [address] Pt will be able to use [facility] for discount. Pt will have options to which pharmacy she will use. CM did check price for Lipitor and cost was $30.00. Pt has Brilinta patient assistance application and the free card to take to pharmacy as well.  No further needs from CM at this time.   Expected Discharge Date:  07/25/16               Expected Discharge Plan:  Home/Self Care  In-House Referral:  Financial Counselor  Discharge planning Services  CM Consult, Follow-up appt scheduled, Medication Assistance, Indigent Health Clinic  Post Acute Care Choice:  NA Choice offered to:  NA  DME Arranged:  N/A DME Agency:  NA  HH Arranged:  NA HH Agency:  NA  Status of Service:  Completed, signed off  If discussed at Long Length of Stay Meetings, dates discussed:    Additional Comments:  Gala Lewandowsky, RN 07/25/2016, 10:47 AM

## 2016-07-31 NOTE — Progress Notes (Deleted)
Cardiology Office Note:    Date:  07/31/2016   ID:  Bridgett Larsson, DOB 45-Dec-1972, MRN [id number]  PCP:  Margaretann Loveless, MD  Cardiologist:  Dr. Delane Ginger    Referring MD: Margaretann Loveless, MD   No chief complaint on file. ***  History of Present Illness:    Krista Alvarez is a 46 y.o. female with a hx of prior NSTEMI tx medically with dual antiplatelet rx, CAD noted on prior LHC in 2016, DM2, tobacco abuse, HTN, HL, FHx of CAD.  She was admitted 7/4-7/6 with an inferior STEMI.  LHC demonstrated high grade disease in the mid RCA which was tx with a DES.  She had mild to mod diffuse non-obstructive disease in the LAD and LCx.  EF was 35-45% by LV gram.  But, follow up echocardiogram demonstrated normal ejection fraction.    [name] ***  Prior CV studies:   The following studies were reviewed today:  Echo 07/24/16 Inf HK, EF 50-55  LHC 07/23/16 LAD prox 30, dist 30 LCx mid 40, dist 40; OM2 40 RCA prox 40/30, mid 99, dist 50 RPDA 30 EF 35-45 PCI: 3 x 38 mm Synergy DES to mid RCA  Past Medical History:  Diagnosis Date  . Anxiety   . Arthritis    "hips" (08/30/2014)  . Asthma   . Chronic lower back pain   . COPD (chronic obstructive pulmonary disease) (HCC)   . Coronary artery disease    07/23/16-STEMI PCI -->DES to RCA, Ef normal   . Depression   . GERD (gastroesophageal reflux disease)   . Hypercholesterolemia   . Hypertension   . Migraine    "a few times/month" (08/30/2014)  . NSTEMI (non-ST elevated myocardial infarction) (HCC) 08/2013   [name] 09/17/2013  . Pneumonia "several times"  . Type II diabetes mellitus (HCC)     Past Surgical History:  Procedure Laterality Date  . CARDIAC CATHETERIZATION    . CARDIAC CATHETERIZATION N/A 08/31/2014   Procedure: Left Heart Cath and Coronary Angiography;  Surgeon: Kathleene Hazel, MD;  Location: [facility] INVASIVE CV LAB;  Service: Cardiovascular;  Laterality: N/A;  . CORONARY ANGIOPLASTY    . CORONARY/GRAFT ACUTE MI  REVASCULARIZATION N/A 07/23/2016   Procedure: Coronary/Graft Acute MI Revascularization;  Surgeon: Corky Crafts, MD;  Location: [facility] INVASIVE CV LAB;  Service: Cardiovascular;  Laterality: N/A;  . LEFT HEART CATH AND CORONARY ANGIOGRAPHY N/A 07/23/2016   Procedure: Left Heart Cath and Coronary Angiography;  Surgeon: Corky Crafts, MD;  Location: [facility] INVASIVE CV LAB;  Service: Cardiovascular;  Laterality: N/A;  . PERCUTANEOUS PINNING PHALANX FRACTURE OF HAND Left    "plate & pins"  . TONSILLECTOMY      Current Medications: No outpatient prescriptions have been marked as taking for the 08/01/16 encounter (Appointment) with Tereso Newcomer T, PA-C.     Allergies:   Hydrocodone-acetaminophen and Ibuprofen   Social History   Social History  . Marital status: Married    Spouse name: N/A  . Number of children: N/A  . Years of education: N/A   Social History Main Topics  . Smoking status: Current Every Day Smoker    Packs/day: 1.00    Years: 30.00    Types: Cigarettes  . Smokeless tobacco: Never Used  . Alcohol use Yes     Comment: 08/30/2014 "I drink a couple times/month"  . Drug use: No     Comment: "back in my younger days"  . Sexual activity: Yes  Other Topics Concern  . Not on file   Social History Narrative  . No narrative on file     Family Hx: The patient's family history includes Heart attack in her father and mother.  ROS:   Please see the history of present illness.    ROS All other systems reviewed and are negative.   EKGs/Labs/Other Test Reviewed:    EKG:  EKG is *** ordered today.  The ekg ordered today demonstrates ***  Recent Labs: 12/17/2015: ALT 13 07/25/2016: BUN 10; Creatinine, Ser 0.59; Hemoglobin 12.1; Platelets 137; Potassium 3.7; Sodium 138   Recent Lipid Panel Lab Results  Component Value Date/Time   CHOL 179 07/23/2016 08:26 PM   CHOL 98 02/07/2014 05:42 PM   TRIG 304 (H) 07/23/2016 08:26 PM   TRIG 109 02/07/2014 05:42 PM   HDL 41  07/23/2016 08:26 PM   HDL 28 (L) 02/07/2014 05:42 PM   CHOLHDL 4.4 07/23/2016 08:26 PM   LDLCALC 77 07/23/2016 08:26 PM   LDLCALC 48 02/07/2014 05:42 PM    Physical Exam:    VS:  There were no vitals taken for this visit.    Wt Readings from Last 3 Encounters:  07/25/16 142 lb 14.4 oz (64.8 kg)  05/25/16 150 lb (68 kg)  05/19/16 150 lb (68 kg)     ***Physical Exam  ASSESSMENT:    1. STEMI involving right coronary artery (HCC)   2. Coronary artery disease involving native coronary artery of native heart without angina pectoris   3. Essential hypertension   4. Hyperlipidemia, unspecified hyperlipidemia type    PLAN:    In order of problems listed above:  STEMI involving right coronary artery (HCC)  Coronary artery disease involving native coronary artery of native heart without angina pectoris  Essential hypertension  Hyperlipidemia, unspecified hyperlipidemia type***  Dispo:  No Follow-up on file.   Medication Adjustments/Labs and Tests Ordered: Current medicines are reviewed at length with the patient today.  Concerns regarding medicines are outlined above.  Tests Ordered: No orders of the defined types were placed in this encounter.  Medication Changes: No orders of the defined types were placed in this encounter.   Signed, Tereso Newcomer, PA-C  07/31/2016 4:55 PM    [facility] [address] [place], [place], [place]  [address] Phone: [phone]; Fax: [phone]

## 2016-08-01 ENCOUNTER — Ambulatory Visit: Payer: Self-pay | Admitting: Physician Assistant

## 2016-08-05 ENCOUNTER — Inpatient Hospital Stay (INDEPENDENT_AMBULATORY_CARE_PROVIDER_SITE_OTHER): Payer: Self-pay | Admitting: Physician Assistant

## 2016-08-27 ENCOUNTER — Ambulatory Visit (INDEPENDENT_AMBULATORY_CARE_PROVIDER_SITE_OTHER): Payer: Self-pay | Admitting: Physician Assistant

## 2016-08-27 ENCOUNTER — Encounter (INDEPENDENT_AMBULATORY_CARE_PROVIDER_SITE_OTHER): Payer: Self-pay | Admitting: Physician Assistant

## 2016-08-27 VITALS — BP 165/99 | HR 74 | Temp 97.6°F | Ht 63.39 in | Wt 143.8 lb

## 2016-08-27 DIAGNOSIS — F419 Anxiety disorder, unspecified: Secondary | ICD-10-CM

## 2016-08-27 DIAGNOSIS — Z716 Tobacco abuse counseling: Secondary | ICD-10-CM

## 2016-08-27 DIAGNOSIS — Z09 Encounter for follow-up examination after completed treatment for conditions other than malignant neoplasm: Secondary | ICD-10-CM

## 2016-08-27 DIAGNOSIS — F329 Major depressive disorder, single episode, unspecified: Secondary | ICD-10-CM

## 2016-08-27 DIAGNOSIS — I214 Non-ST elevation (NSTEMI) myocardial infarction: Secondary | ICD-10-CM

## 2016-08-27 MED ORDER — NICOTINE 21 MG/24HR TD PT24: 21.0000 mg | MEDICATED_PATCH | Freq: Every day | TRANSDERMAL | 0 refills | Status: DC

## 2016-08-27 MED ORDER — NICOTINE 21 MG/24HR TD PT24
21.0000 mg | MEDICATED_PATCH | Freq: Every day | TRANSDERMAL | 0 refills | Status: DC
Start: 2016-08-27 — End: 2017-06-25

## 2016-08-27 MED ORDER — ESCITALOPRAM OXALATE 10 MG PO TABS: 10.0000 mg | ORAL_TABLET | Freq: Every day | ORAL | 1 refills | Status: DC

## 2016-08-27 NOTE — Progress Notes (Signed)
Subjective:  Patient ID: Krista Alvarez, female    DOB: April 25, 1970  Age: 46 y.o. MRN: [id number]  CC: f/u hospital  HPI Krista Alvarez is a 46 y.o. female with a PMH of asthma, chronic LBP, COPD, CAD, depression, GERD, HLD, HTN, migraines, PNA, and DM2 presents for hospital f/u. Admitted from 07/23/16 to 07/25/16 for NSTEMI involving the right coronary artery. Had PCI on 07/23/16. Synergy DES placed due to noncompliance with dual antiplatelet therapy in the past. Patient feeling better but is agitated and fidgety. Has mild SOB and anxiety at times. Partner says she has a short fuse and is known as the "Hulk" at work. Patient also admits to having a negative and combative attitude most of the time but describes herself as a good person. Missed her cardiology f/u appt on 08/01/16 but rescheduled for 09/02/16. Denies CP, palpitations, HA, abdominal pain, f/c/n/v, rash, or GI/GU sxs.   Outpatient Medications Prior to Visit  Medication Sig Dispense Refill  . Ipratropium-Albuterol (COMBIVENT RESPIMAT) 20-100 MCG/ACT AERS respimat Inhale 1 puff into the lungs 4 (four) times daily.     . metoprolol tartrate (LOPRESSOR) 25 MG tablet Take 0.5 tablets (12.5 mg total) by mouth 2 (two) times daily. 30 tablet 1  . ticagrelor (BRILINTA) 90 MG TABS tablet Take 1 tablet (90 mg total) by mouth 2 (two) times daily. 60 tablet 0  . aspirin EC 81 MG tablet Take 1 tablet (81 mg total) by mouth daily.    [name] atorvastatin (LIPITOR) 80 MG tablet Take 80 mg by mouth daily.    . metFORMIN (GLUCOPHAGE) 500 MG tablet Take 500 mg by mouth 2 (two) times daily with a meal.    . nitroGLYCERIN (NITROSTAT) 0.4 MG SL tablet Place 0.4 mg under the tongue every 5 (five) minutes as needed for chest pain.    [name] omega-3 acid ethyl esters (LOVAZA) 1 g capsule Take 1 capsule (1 g total) by mouth 2 (two) times daily. 60 capsule 2  . traZODone (DESYREL) 50 MG tablet Take 50 mg by mouth at bedtime.     No facility-administered medications prior  to visit.      ROS Review of Systems  Constitutional: Negative for chills, fever and malaise/fatigue.  Eyes: Negative for blurred vision.  Respiratory: Negative for shortness of breath.   Cardiovascular: Negative for chest pain and palpitations.  Gastrointestinal: Negative for abdominal pain and nausea.  Genitourinary: Negative for dysuria and hematuria.  Musculoskeletal: Negative for joint pain and myalgias.  Skin: Negative for rash.  Neurological: Negative for tingling and headaches.  Psychiatric/Behavioral: Negative for depression. The patient is nervous/anxious.     Objective:  Ht 5' 3.39" (1.61 m)   Wt 143 lb 12.8 oz (65.2 kg)   LMP 08/14/2016 (Approximate)   BMI 25.16 kg/m   BP/Weight 08/27/2016 07/25/2016 05/25/2016  Systolic BP - 113 134  Diastolic BP - 73 86  Wt. (Lbs) 143.8 142.9 150  BMI 25.16 26.14 27.44      Physical Exam  Constitutional: She is oriented to person, place, and time.  Well developed, overweight, NAD, polite  HENT:  Head: Normocephalic and atraumatic.  Eyes: No scleral icterus.  Cardiovascular: Normal rate, regular rhythm and normal heart sounds.   Pulmonary/Chest: Effort normal and breath sounds normal.  Abdominal: Soft. Bowel sounds are normal.  Musculoskeletal: She exhibits no edema.  Neurological: She is alert and oriented to person, place, and time. No cranial nerve deficit. Coordination normal.  Skin: Skin is warm and dry. No rash  noted. No erythema. No pallor.  Psychiatric: She has a normal mood and affect. Thought content normal.  Patient very fidgety, somewhat defensive, seems to be in a bad mood.   Vitals reviewed.    Assessment & Plan:   1. Hospital discharge follow-up - Drug Screen, Urine - CBC with Differential - Comprehensive metabolic panel - TSH  2. NSTEMI (non-ST elevated myocardial infarction) [facility]) - Keep appt with cardiology on 09/02/16.  3. Anxiety and depression - escitalopram (LEXAPRO) 10 MG tablet; Take 1  tablet (10 mg total) by mouth daily.  Dispense: 30 tablet; Refill: 1  4. Encounter for tobacco use cessation counseling - nicotine (NICODERM CQ - DOSED IN MG/24 HOURS) 21 mg/24hr patch; Place 1 patch (21 mg total) onto the skin daily.  Dispense: 28 patch; Refill: 0   Meds ordered this encounter  Medications  . nicotine (NICODERM CQ - DOSED IN MG/24 HOURS) 21 mg/24hr patch    Sig: Place 1 patch (21 mg total) onto the skin daily.    Dispense:  28 patch    Refill:  0    Order Specific Question:   Supervising Provider    Answer:   Quentin Angst [id number]  . escitalopram (LEXAPRO) 10 MG tablet    Sig: Take 1 tablet (10 mg total) by mouth daily.    Dispense:  30 tablet    Refill:  1    Order Specific Question:   Supervising Provider    Answer:   Quentin Angst [id number]    Follow-up: Return in about 4 weeks (around 09/24/2016) for anxiety.   Loletta Specter PA

## 2016-08-27 NOTE — Patient Instructions (Signed)

## 2016-08-28 LAB — CBC WITH DIFFERENTIAL/PLATELET
Basophils Absolute: 0 10*3/uL (ref 0.0–0.2)
Basos: 0 %
EOS (ABSOLUTE): 0.3 10*3/uL (ref 0.0–0.4)
EOS: 4 %
HEMATOCRIT: 40.2 % (ref 34.0–46.6)
HEMOGLOBIN: 13.3 g/dL (ref 11.1–15.9)
IMMATURE GRANULOCYTES: 1 %
Immature Grans (Abs): 0 10*3/uL (ref 0.0–0.1)
LYMPHS: 32 %
Lymphocytes Absolute: 2.4 10*3/uL (ref 0.7–3.1)
MCH: 32.1 pg (ref 26.6–33.0)
MCHC: 33.1 g/dL (ref 31.5–35.7)
MCV: 97 fL (ref 79–97)
MONOCYTES: 6 %
Monocytes Absolute: 0.4 10*3/uL (ref 0.1–0.9)
NEUTROS PCT: 57 %
Neutrophils Absolute: 4.3 10*3/uL (ref 1.4–7.0)
Platelets: 189 10*3/uL (ref 150–379)
RBC: 4.14 x10E6/uL (ref 3.77–5.28)
RDW: 15 % (ref 12.3–15.4)
WBC: 7.5 10*3/uL (ref 3.4–10.8)

## 2016-08-28 LAB — COMPREHENSIVE METABOLIC PANEL
A/G RATIO: 1.8 (ref 1.2–2.2)
ALT: 13 IU/L (ref 0–32)
AST: 9 IU/L (ref 0–40)
Albumin: 4.2 g/dL (ref 3.5–5.5)
Alkaline Phosphatase: 63 IU/L (ref 39–117)
BUN/Creatinine Ratio: 15 (ref 9–23)
BUN: 10 mg/dL (ref 6–24)
Bilirubin Total: 0.3 mg/dL (ref 0.0–1.2)
CALCIUM: 9 mg/dL (ref 8.7–10.2)
CO2: 21 mmol/L (ref 20–29)
Chloride: 103 mmol/L (ref 96–106)
Creatinine, Ser: 0.68 mg/dL (ref 0.57–1.00)
GFR calc Af Amer: 123 mL/min/{1.73_m2} (ref >59)
GFR, EST NON AFRICAN AMERICAN: 107 mL/min/{1.73_m2} (ref >59)
GLUCOSE: 135 mg/dL — AB (ref 65–99)
Globulin, Total: 2.3 g/dL (ref 1.5–4.5)
POTASSIUM: 3.9 mmol/L (ref 3.5–5.2)
Sodium: 139 mmol/L (ref 134–144)
TOTAL PROTEIN: 6.5 g/dL (ref 6.0–8.5)

## 2016-08-28 LAB — DRUG SCREEN, URINE
AMPHETAMINES, URINE: NEGATIVE ng/mL
BARBITURATE SCREEN URINE: NEGATIVE ng/mL
Benzodiazepine Quant, Ur: NEGATIVE ng/mL
Cannabinoid Quant, Ur: NEGATIVE ng/mL
Cocaine (Metab.): NEGATIVE ng/mL
Opiate Quant, Ur: NEGATIVE ng/mL
PCP Quant, Ur: NEGATIVE ng/mL

## 2016-08-28 LAB — TSH: TSH: 1.43 u[IU]/mL (ref 0.450–4.500)

## 2016-09-02 ENCOUNTER — Ambulatory Visit: Payer: Self-pay | Admitting: Physician Assistant

## 2016-09-02 ENCOUNTER — Encounter (INDEPENDENT_AMBULATORY_CARE_PROVIDER_SITE_OTHER): Payer: Self-pay

## 2016-09-02 NOTE — Progress Notes (Deleted)
Cardiology Office Note:    Date:  09/02/2016   ID:  Krista Alvarez, DOB 06-Jan-1971, MRN [id number]  PCP:  Margaretann Loveless, MD  Cardiologist:  Dr. Delane Ginger    Referring MD: Margaretann Loveless, MD   No chief complaint on file. ***  History of Present Illness:    Krista Alvarez is a 46 y.o. female with a hx of CAD with non-STEMI in 2015. She was evaluated at [place] (Dr. Welton Flakes) at that time and noted to have mild to moderate nonobstructive, diffuse, 3 vessel CAD. She was treated medically. LHC in 2016 at [facility] also showed diffuse mild to moderate nonobstructive disease. She was seen by Dr. Elease Hashimoto in consultation during that admission. However, she has not been seen in follow-up in our office since then. Other history includes diabetes, HTN, HL, asthma, tobacco abuse.  She was admitted 7/4-7/6 within inferior STEMI. ***  Krista Alvarez ***  Prior CV studies:   The following studies were reviewed today:  Echo 07/24/16 Inferior hypokinesis, EF 50-55, normal diastolic function  LHC 07/23/16 LAD proximal 30, distal 30 LCx mid 40, 40; OM 240 RCA proximal 40, 30; mid 99; distal 50; RPDA 30 EF 35-45 PCI: 3 x 38 mm Synergy DES to the mid RCA  Chest CTA 11/17 IMPRESSION: Normal thoracoabdominal aorta without evidence of aneurysm or dissection. No acute findings in the chest, abdomen or pelvis. Few small bilateral renal cysts.  Past Medical History:  Diagnosis Date  . Anxiety   . Arthritis    "hips" (08/30/2014)  . Asthma   . Chronic lower back pain   . COPD (chronic obstructive pulmonary disease) (HCC)   . Coronary artery disease    07/23/16-STEMI PCI -->DES to RCA, Ef normal   . Depression   . GERD (gastroesophageal reflux disease)   . Hypercholesterolemia   . Hypertension   . Migraine    "a few times/month" (08/30/2014)  . NSTEMI (non-ST elevated myocardial infarction) (HCC) 08/2013   [name] 09/17/2013  . Pneumonia "several times"  . Type II diabetes mellitus (HCC)      Past Surgical History:  Procedure Laterality Date  . CARDIAC CATHETERIZATION    . CARDIAC CATHETERIZATION N/A 08/31/2014   Procedure: Left Heart Cath and Coronary Angiography;  Surgeon: Kathleene Hazel, MD;  Location: [facility] INVASIVE CV LAB;  Service: Cardiovascular;  Laterality: N/A;  . CORONARY ANGIOPLASTY    . CORONARY/GRAFT ACUTE MI REVASCULARIZATION N/A 07/23/2016   Procedure: Coronary/Graft Acute MI Revascularization;  Surgeon: Corky Crafts, MD;  Location: [facility] INVASIVE CV LAB;  Service: Cardiovascular;  Laterality: N/A;  . LEFT HEART CATH AND CORONARY ANGIOGRAPHY N/A 07/23/2016   Procedure: Left Heart Cath and Coronary Angiography;  Surgeon: Corky Crafts, MD;  Location: [facility] INVASIVE CV LAB;  Service: Cardiovascular;  Laterality: N/A;  . PERCUTANEOUS PINNING PHALANX FRACTURE OF HAND Left    "plate & pins"  . TONSILLECTOMY      Current Medications: No outpatient prescriptions have been marked as taking for the 09/02/16 encounter (Appointment) with Tereso Newcomer T, PA-C.     Allergies:   Hydrocodone-acetaminophen and Ibuprofen   Social History   Social History  . Marital status: Married    Spouse name: N/A  . Number of children: N/A  . Years of education: N/A   Social History Main Topics  . Smoking status: Current Every Day Smoker    Packs/day: 1.00    Years: 30.00    Types: Cigarettes  . Smokeless tobacco: Never  Used  . Alcohol use Yes     Comment: 08/30/2014 "I drink a couple times/month"  . Drug use: No     Comment: "back in my younger days"  . Sexual activity: Yes   Other Topics Concern  . Not on file   Social History Narrative  . No narrative on file     Family Hx: The patient's family history includes Heart attack in her father and mother.  ROS:   Please see the history of present illness.    ROS All other systems reviewed and are negative.   EKGs/Labs/Other Test Reviewed:    EKG:  EKG is *** ordered today.  The ekg ordered today  demonstrates ***  Recent Labs: 08/27/2016: ALT 13; BUN 10; Creatinine, Ser 0.68; Hemoglobin 13.3; Platelets 189; Potassium 3.9; Sodium 139; TSH 1.430   Recent Lipid Panel Lab Results  Component Value Date/Time   CHOL 179 07/23/2016 08:26 PM   CHOL 98 02/07/2014 05:42 PM   TRIG 304 (H) 07/23/2016 08:26 PM   TRIG 109 02/07/2014 05:42 PM   HDL 41 07/23/2016 08:26 PM   HDL 28 (L) 02/07/2014 05:42 PM   CHOLHDL 4.4 07/23/2016 08:26 PM   LDLCALC 77 07/23/2016 08:26 PM   LDLCALC 48 02/07/2014 05:42 PM    Physical Exam:    VS:  LMP 08/14/2016 (Approximate)     Wt Readings from Last 3 Encounters:  08/27/16 143 lb 12.8 oz (65.2 kg)  07/25/16 142 lb 14.4 oz (64.8 kg)  05/25/16 150 lb (68 kg)     ***Physical Exam  ASSESSMENT:    No diagnosis found. PLAN:    In order of problems listed above:  No diagnosis found.***  Dispo:  No Follow-up on file.   Medication Adjustments/Labs and Tests Ordered: Current medicines are reviewed at length with the patient today.  Concerns regarding medicines are outlined above.  Tests Ordered: No orders of the defined types were placed in this encounter.  Medication Changes: No orders of the defined types were placed in this encounter.   Signed, Tereso Newcomer, PA-C  09/02/2016 7:57 AM    [facility] [address] [place], [place], [place]  [address] Phone: [phone]; Fax: [phone]

## 2016-09-03 ENCOUNTER — Encounter: Payer: Self-pay | Admitting: Physician Assistant

## 2017-06-24 ENCOUNTER — Emergency Department (HOSPITAL_COMMUNITY): Payer: Self-pay

## 2017-06-24 ENCOUNTER — Inpatient Hospital Stay (HOSPITAL_COMMUNITY)
Admission: EM | Admit: 2017-06-24 | Discharge: 2017-06-27 | DRG: 303 | Disposition: A | Discharge status: Home / Self Care | Payer: BLUE CROSS/BLUE SHIELD | Attending: Family Medicine | Admitting: Family Medicine

## 2017-06-24 ENCOUNTER — Emergency Department (HOSPITAL_COMMUNITY): Admit: 2017-06-24 | Discharge: 2017-06-24 | Disposition: A | Discharge status: Home / Self Care | Payer: Self-pay

## 2017-06-24 ENCOUNTER — Encounter (HOSPITAL_COMMUNITY): Payer: Self-pay | Admitting: Emergency Medicine

## 2017-06-24 ENCOUNTER — Telehealth: Payer: Self-pay | Admitting: Surgery

## 2017-06-24 ENCOUNTER — Emergency Department (HOSPITAL_COMMUNITY)
Admit: 2017-06-24 | Discharge: 2017-06-24 | Disposition: A | Discharge status: Home / Self Care | Payer: Self-pay | Attending: Emergency Medicine | Admitting: Emergency Medicine

## 2017-06-24 DIAGNOSIS — F419 Anxiety disorder, unspecified: Secondary | ICD-10-CM | POA: Diagnosis present

## 2017-06-24 DIAGNOSIS — R0789 Other chest pain: Secondary | ICD-10-CM | POA: Diagnosis present

## 2017-06-24 DIAGNOSIS — E781 Pure hyperglyceridemia: Secondary | ICD-10-CM | POA: Diagnosis present

## 2017-06-24 DIAGNOSIS — I708 Atherosclerosis of other arteries: Secondary | ICD-10-CM | POA: Diagnosis present

## 2017-06-24 DIAGNOSIS — R609 Edema, unspecified: Secondary | ICD-10-CM

## 2017-06-24 DIAGNOSIS — Z7982 Long term (current) use of aspirin: Secondary | ICD-10-CM

## 2017-06-24 DIAGNOSIS — I2510 Atherosclerotic heart disease of native coronary artery without angina pectoris: Secondary | ICD-10-CM | POA: Diagnosis present

## 2017-06-24 DIAGNOSIS — R001 Bradycardia, unspecified: Secondary | ICD-10-CM | POA: Diagnosis present

## 2017-06-24 DIAGNOSIS — I70203 Unspecified atherosclerosis of native arteries of extremities, bilateral legs: Secondary | ICD-10-CM

## 2017-06-24 DIAGNOSIS — I252 Old myocardial infarction: Secondary | ICD-10-CM

## 2017-06-24 DIAGNOSIS — I745 Embolism and thrombosis of iliac artery: Secondary | ICD-10-CM | POA: Diagnosis present

## 2017-06-24 DIAGNOSIS — I10 Essential (primary) hypertension: Secondary | ICD-10-CM | POA: Diagnosis present

## 2017-06-24 DIAGNOSIS — J449 Chronic obstructive pulmonary disease, unspecified: Secondary | ICD-10-CM | POA: Diagnosis present

## 2017-06-24 DIAGNOSIS — I998 Other disorder of circulatory system: Principal | ICD-10-CM | POA: Diagnosis present

## 2017-06-24 DIAGNOSIS — F329 Major depressive disorder, single episode, unspecified: Secondary | ICD-10-CM | POA: Diagnosis present

## 2017-06-24 DIAGNOSIS — E119 Type 2 diabetes mellitus without complications: Secondary | ICD-10-CM | POA: Diagnosis present

## 2017-06-24 DIAGNOSIS — F17210 Nicotine dependence, cigarettes, uncomplicated: Secondary | ICD-10-CM | POA: Diagnosis present

## 2017-06-24 DIAGNOSIS — K219 Gastro-esophageal reflux disease without esophagitis: Secondary | ICD-10-CM | POA: Diagnosis present

## 2017-06-24 DIAGNOSIS — Z955 Presence of coronary angioplasty implant and graft: Secondary | ICD-10-CM

## 2017-06-24 DIAGNOSIS — E7800 Pure hypercholesterolemia, unspecified: Secondary | ICD-10-CM | POA: Diagnosis present

## 2017-06-24 DIAGNOSIS — Z79899 Other long term (current) drug therapy: Secondary | ICD-10-CM

## 2017-06-24 LAB — COMPREHENSIVE METABOLIC PANEL
ALK PHOS: 66 U/L (ref 38–126)
ALT: 19 U/L (ref 14–54)
AST: 15 U/L (ref 15–41)
Albumin: 4.1 g/dL (ref 3.5–5.0)
Anion gap: 12 (ref 5–15)
BUN: 11 mg/dL (ref 6–20)
CALCIUM: 9.2 mg/dL (ref 8.9–10.3)
CHLORIDE: 100 mmol/L — AB (ref 101–111)
CO2: 22 mmol/L (ref 22–32)
CREATININE: 0.61 mg/dL (ref 0.44–1.00)
Glucose, Bld: 131 mg/dL — ABNORMAL HIGH (ref 65–99)
Potassium: 3.7 mmol/L (ref 3.5–5.1)
Sodium: 134 mmol/L — ABNORMAL LOW (ref 135–145)
TOTAL PROTEIN: 6.9 g/dL (ref 6.5–8.1)
Total Bilirubin: 0.7 mg/dL (ref 0.3–1.2)

## 2017-06-24 LAB — CBC WITH DIFFERENTIAL/PLATELET
ABS IMMATURE GRANULOCYTES: 0.3 10*3/uL — AB (ref 0.0–0.1)
BASOS PCT: 1 %
Basophils Absolute: 0.1 10*3/uL (ref 0.0–0.1)
Eosinophils Absolute: 0.3 10*3/uL (ref 0.0–0.7)
Eosinophils Relative: 2 %
HCT: 40.6 % (ref 36.0–46.0)
Hemoglobin: 13.8 g/dL (ref 12.0–15.0)
Immature Granulocytes: 2 %
Lymphocytes Relative: 22 %
Lymphs Abs: 2.8 10*3/uL (ref 0.7–4.0)
MCH: 31.6 pg (ref 26.0–34.0)
MCHC: 34 g/dL (ref 30.0–36.0)
MCV: 92.9 fL (ref 78.0–100.0)
MONO ABS: 1.4 10*3/uL — AB (ref 0.1–1.0)
MONOS PCT: 11 %
NEUTROS ABS: 7.9 10*3/uL — AB (ref 1.7–7.7)
Neutrophils Relative %: 62 %
PLATELETS: 184 10*3/uL (ref 150–400)
RBC: 4.37 MIL/uL (ref 3.87–5.11)
RDW: 15.2 % (ref 11.5–15.5)
WBC: 12.7 10*3/uL — ABNORMAL HIGH (ref 4.0–10.5)

## 2017-06-24 LAB — POC URINE PREG, ED: Preg Test, Ur: NEGATIVE

## 2017-06-24 LAB — PROTIME-INR
INR: 0.96
PROTHROMBIN TIME: 12.7 s (ref 11.4–15.2)

## 2017-06-24 LAB — TROPONIN I: Troponin I: <0.03 ng/mL (ref <0.03)

## 2017-06-24 MED ORDER — IOPAMIDOL (ISOVUE-370) INJECTION 76%
INTRAVENOUS | Status: AC
  Filled 2017-06-24: qty 100

## 2017-06-24 MED ORDER — MORPHINE SULFATE (PF) 4 MG/ML IV SOLN
4.0000 mg | Freq: Once | INTRAVENOUS | Status: AC
  Administered 2017-06-24: 4 mg via INTRAVENOUS
  Filled 2017-06-24: qty 1

## 2017-06-24 MED ORDER — OXYCODONE-ACETAMINOPHEN 5-325 MG PO TABS
1.0000 | ORAL_TABLET | Freq: Once | ORAL | Status: AC
  Administered 2017-06-24: 1 via ORAL
  Filled 2017-06-24: qty 1

## 2017-06-24 MED ORDER — IOPAMIDOL (ISOVUE-370) INJECTION 76%
100.0000 mL | Freq: Once | INTRAVENOUS | Status: AC | PRN
  Administered 2017-06-24: 100 mL via INTRAVENOUS

## 2017-06-24 MED ORDER — HEPARIN BOLUS VIA INFUSION
4000.0000 [IU] | Freq: Once | INTRAVENOUS | Status: AC
Start: 2017-06-24 — End: 2017-06-25
  Administered 2017-06-25: 4000 [IU] via INTRAVENOUS
  Filled 2017-06-24: qty 4000

## 2017-06-24 MED ORDER — HEPARIN (PORCINE) IN NACL 100-0.45 UNIT/ML-% IJ SOLN
1250.0000 [IU]/h | INTRAMUSCULAR | Status: DC
  Administered 2017-06-25 (×2): 1250 [IU]/h via INTRAVENOUS
  Administered 2017-06-25: 1200 [IU]/h via INTRAVENOUS
  Filled 2017-06-24 (×2): qty 250

## 2017-06-24 NOTE — ED Notes (Signed)
Patient transported to CT 

## 2017-06-24 NOTE — ED Provider Notes (Signed)
Patient placed in Quick Look pathway, seen and evaluated   Chief Complaint: "My fingers are turning blue and they hurt."  HPI:   47 year old female with a history of CAD, STEMI, DM Type II, HLD, HTN, and tobacco dependence who presents to the emergency department with a chief complaint of left hand pain and color change since yesterday.  Symptoms have been constant and worsening.  The third, fourth, fifth digits of the left hand are blue and painful.  Blue discoloration began at the fingertips and is extending approximately.  She she reports that she had some left-sided chest pain yesterday that is since resolved.  She is also been having worsening dyspnea over the last few days.  No history of similar symptoms.  She has a history of surgery from a fracture to the left hand and states that she has plates and screws in the extremity.  ROS: myalgias, color change, chest pain, dyspnea  Physical Exam:   Gen: No distress  Neuro: Awake and Alert  Skin: Warm    Focused Exam: Poor capillary refill to the third fourth and fifth digits of the left hand.  Fingertips are cold and blue No blue discoloration or coldness extending to the palm of the dorsum of the left hand.  Radial pulses are 2+ and symmetric.  Carotid pulses are 2+ and symmetric.  DP pulses are 2+ and symmetric.  No blue discoloration or pain to the feet.   Initiation of care has begun. The patient has been counseled on the process, plan, and necessity for staying for the completion/evaluation, and the remainder of the medical screening examination     Barkley Boards, PA-C 06/24/17 1419    Blane Ohara, MD 06/24/17 [phone]

## 2017-06-24 NOTE — ED Notes (Signed)
Pt is okay with being in a hallway.

## 2017-06-24 NOTE — ED Provider Notes (Signed)
[facility] EMERGENCY DEPARTMENT Provider Note   CSN: [id number] Arrival date & time: 06/24/17  1323     History   Chief Complaint No chief complaint on file.   HPI Krista Alvarez is a 47 y.o. female.  HPI Patient presents to the emergency department with her third fourth and fifth digits on the left hand turning blue and cold at the distal ends.  Patient states she having pain 2 days ago and then noticed the discoloration yesterday.  Patient states the pain has gotten worse over that timeframe.  The patient states she has had an MI in the past and she is a heavy smoker.  Patient states that nothing seems to make the condition better but palpation and movement makes the pain worse.  The patient denies chest pain, shortness of breath, headache,blurred vision, neck pain, fever, cough, weakness, numbness, dizziness, anorexia, edema, abdominal pain, nausea, vomiting, diarrhea, rash, back pain, dysuria, hematemesis, bloody stool, near syncope, or syncope. Past Medical History:  Diagnosis Date  . Anxiety   . Arthritis    "hips" (08/30/2014)  . Asthma   . Chronic lower back pain   . COPD (chronic obstructive pulmonary disease) (HCC)   . Coronary artery disease    07/23/16-STEMI PCI -->DES to RCA, Ef normal   . Depression   . GERD (gastroesophageal reflux disease)   . Hypercholesterolemia   . Hypertension   . Migraine    "a few times/month" (08/30/2014)  . NSTEMI (non-ST elevated myocardial infarction) (HCC) 08/2013   [name] 09/17/2013  . Pneumonia "several times"  . Type II diabetes mellitus [facility])     Patient Active Problem List   Diagnosis Date Noted  . Hyperlipidemia 07/25/2016  . Hypertension 07/25/2016  . STEMI involving right coronary artery (HCC) 07/23/2016  . Acute inferoposterior myocardial infarction (HCC) 07/23/2016  . CAD (coronary artery disease)   . Type 2 diabetes mellitus without complication (HCC)   . Tobacco abuse   . COLD (chronic obstructive  lung disease) (HCC)   . Pain in the chest   . Chest pain 08/30/2014  . TOBACCO DEPENDENCE 03/19/2006  . PARESTHESIA NOS 03/19/2006    Past Surgical History:  Procedure Laterality Date  . CARDIAC CATHETERIZATION    . CARDIAC CATHETERIZATION N/A 08/31/2014   Procedure: Left Heart Cath and Coronary Angiography;  Surgeon: Kathleene Hazel, MD;  Location: [facility] INVASIVE CV LAB;  Service: Cardiovascular;  Laterality: N/A;  . CORONARY ANGIOPLASTY    . CORONARY/GRAFT ACUTE MI REVASCULARIZATION N/A 07/23/2016   Procedure: Coronary/Graft Acute MI Revascularization;  Surgeon: Corky Crafts, MD;  Location: [facility] INVASIVE CV LAB;  Service: Cardiovascular;  Laterality: N/A;  . LEFT HEART CATH AND CORONARY ANGIOGRAPHY N/A 07/23/2016   Procedure: Left Heart Cath and Coronary Angiography;  Surgeon: Corky Crafts, MD;  Location: [facility] INVASIVE CV LAB;  Service: Cardiovascular;  Laterality: N/A;  . PERCUTANEOUS PINNING PHALANX FRACTURE OF HAND Left    "plate & pins"  . TONSILLECTOMY       OB History   None      Home Medications    Prior to Admission medications   Medication Sig Start Date End Date Taking? Authorizing Provider  aspirin EC 81 MG tablet Take 1 tablet (81 mg total) by mouth daily. 07/25/16   Arty Baumgartner, NP  atorvastatin (LIPITOR) 80 MG tablet Take 80 mg by mouth daily.    [provider]  escitalopram (LEXAPRO) 10 MG tablet Take 1 tablet (10 mg total)  by mouth daily. 08/27/16   Loletta Specter, PA-C  Ipratropium-Albuterol (COMBIVENT RESPIMAT) 20-100 MCG/ACT AERS respimat Inhale 1 puff into the lungs 4 (four) times daily.     [provider]  metFORMIN (GLUCOPHAGE) 500 MG tablet Take 500 mg by mouth 2 (two) times daily with a meal.    [provider]  metoprolol tartrate (LOPRESSOR) 25 MG tablet Take 0.5 tablets (12.5 mg total) by mouth 2 (two) times daily. 07/25/16   Arty Baumgartner, NP  nicotine (NICODERM CQ - DOSED IN MG/24 HOURS) 21 mg/24hr  patch Place 1 patch (21 mg total) onto the skin daily. 08/27/16   Loletta Specter, PA-C  nitroGLYCERIN (NITROSTAT) 0.4 MG SL tablet Place 0.4 mg under the tongue every 5 (five) minutes as needed for chest pain.    [provider]  omega-3 acid ethyl esters (LOVAZA) 1 g capsule Take 1 capsule (1 g total) by mouth 2 (two) times daily. 07/25/16   Arty Baumgartner, NP  ticagrelor (BRILINTA) 90 MG TABS tablet Take 1 tablet (90 mg total) by mouth 2 (two) times daily. 07/25/16   Arty Baumgartner, NP  traZODone (DESYREL) 50 MG tablet Take 50 mg by mouth at bedtime.    [provider]    Family History Family History  Problem Relation Age of Onset  . Heart attack Mother   . Heart attack Father     Social History Social History   Tobacco Use  . Smoking status: Current Every Day Smoker    Packs/day: 1.00    Years: 30.00    Pack years: 30.00    Types: Cigarettes  . Smokeless tobacco: Never Used  Substance Use Topics  . Alcohol use: Yes    Comment: 08/30/2014 "I drink a couple times/month"  . Drug use: No    Types: Marijuana    Comment: "back in my younger days"     Allergies   Hydrocodone-acetaminophen and Ibuprofen   Review of Systems Review of Systems All other systems negative except as documented in the HPI. All pertinent positives and negatives as reviewed in the HPI. Physical Exam Updated Vital Signs BP (!) 169/84   Pulse 87   Temp 98 F (36.7 C)   Resp 18   Ht 5\' 4"  (1.626 m)   Wt 65.8 kg (145 lb)   LMP 05/24/2017   SpO2 98%   BMI 24.89 kg/m   Physical Exam  Constitutional: She is oriented to person, place, and time. She appears well-developed and well-nourished. No distress.  HENT:  Head: Normocephalic and atraumatic.  Mouth/Throat: Oropharynx is clear and moist.  Eyes: Pupils are equal, round, and reactive to light.  Neck: Normal range of motion. Neck supple.  Cardiovascular: Normal rate, regular rhythm and normal heart sounds. Exam reveals  no gallop and no friction rub.  No murmur heard. Pulmonary/Chest: Effort normal and breath sounds normal. No respiratory distress. She has no wheezes.  Musculoskeletal:       Left hand: She exhibits tenderness and decreased capillary refill. Normal sensation noted. Normal strength noted.       Hands: Neurological: She is alert and oriented to person, place, and time. She exhibits normal muscle tone. Coordination normal.  Skin: Skin is warm and dry. Capillary refill takes less than 2 seconds. No rash noted. No erythema.  Psychiatric: She has a normal mood and affect. Her behavior is normal.  Nursing note and vitals reviewed.    ED Treatments / Results  Labs (all labs  ordered are listed, but only abnormal results are displayed) Labs Reviewed  CBC WITH DIFFERENTIAL/PLATELET - Abnormal; Notable for the following components:      Result Value   WBC 12.7 (*)    Neutro Abs 7.9 (*)    Monocytes Absolute 1.4 (*)    Abs Immature Granulocytes 0.3 (*)    All other components within normal limits  COMPREHENSIVE METABOLIC PANEL - Abnormal; Notable for the following components:   Sodium 134 (*)    Chloride 100 (*)    Glucose, Bld 131 (*)    All other components within normal limits  PROTIME-INR  TROPONIN I    EKG None  Radiology Dg Chest 2 View  Result Date: 06/24/2017 CLINICAL DATA:  Left-sided chest pain and shortness of breath for several days EXAM: CHEST - 2 VIEW COMPARISON:  07/23/2016 FINDINGS: The heart size and mediastinal contours are within normal limits. Both lungs are clear. The visualized skeletal structures are unremarkable. IMPRESSION: No active cardiopulmonary disease. Electronically Signed   By: Alcide Clever M.D.   On: 06/24/2017 15:04    Procedures Procedures (including critical care time)  Medications Ordered in ED Medications  oxyCODONE-acetaminophen (PERCOCET/ROXICET) 5-325 MG per tablet 1 tablet (1 tablet Oral Given 06/24/17 1500)     Initial Impression /  Assessment and Plan / ED Course  I have reviewed the triage vital signs and the nursing notes.  Pertinent labs & imaging results that were available during my care of the patient were reviewed by me and considered in my medical decision making (see chart for details).     I spoke with Dr. Myra Gianotti of vascular surgery who recommended a CTA of the chest along with duplex of the upper extremity on the left.  Patient was likely need admission to the hospital for further care and work-up of this issue.  Final Clinical Impressions(s) / ED Diagnoses   Final diagnoses:  None    ED Discharge Orders    None       Charlestine Night, PA-C 06/24/17 1642    Eber Hong, MD 06/25/17 [phone]

## 2017-06-24 NOTE — ED Notes (Signed)
Patient transported to Ultrasound 

## 2017-06-24 NOTE — ED Provider Notes (Signed)
Medical screening examination/treatment/procedure(s) were conducted as a shared visit with non-physician practitioner(s) and myself.  I personally evaluated the patient during the encounter.  Clinical Impression:   Final diagnoses:  None      The patient has several days of R hand, pain in the third fourth and fifth digits with some discoloration.  On my exam she does have decreased capillary refill to those fingers.  Her thumb and index finger have normal cap refill and she has normal pulses at the wrist.  No other signs of vascular compromise, no history of claudication, the patient is a chronic smoker.  She will need vascular surgery consultation at least by phone    Eber Hong, MD 06/25/17 [phone]

## 2017-06-24 NOTE — Progress Notes (Signed)
*  PRELIMINARY RESULTS* Vascular Ultrasound Left Upper Extremity Arterial Duplex has been completed.   There is no evidence of aneurysm or stenosis involving the visualized arteries of the left upper extremity. Waveforms become dampened at proximal radial and ulnar arteries without visualized etiology; distal brachial artery is patent with triphasic waveforms.   Left Upper Extremity Arterial Doppler with Digit Pressures completed.  Pressure Index  BRACHIAL 178   1st Digit 176 0.99  2nd Digit 167 0.94  3rd Digit 165 0.93  4th Digit 39 0.22  5th Digit 158 0.89   Perfusion is significantly diminished to the fourth digit; etiology is unknown.  06/24/2017 7:35 PM Gertie Fey, BS, RVT, RDCS, RDMS

## 2017-06-24 NOTE — Telephone Encounter (Signed)
I spoke with  Krista Alvarez about this patient who presented with pain and discoloration to 3 fingers.  This is been going on for 1 week.  I was told that she has palpable radial and ulnar pulses on the affected extremity.  I recommended getting a duplex with finger pressures to evaluate for perfusion to the hand as well as a CT scan for evaluation of a embolic source.  The patient would also need an echo.  I am to be contacted if a possible vascular etiology is identified for her problem.  Otherwise, she will need follow-up with a hand surgeon.  Durene Cal

## 2017-06-24 NOTE — Progress Notes (Signed)
ANTICOAGULATION CONSULT NOTE - Initial Consult  Pharmacy Consult for heparin Indication: ischemic finger and common iliac clot  Allergies  Allergen Reactions  . Hydrocodone-Acetaminophen Itching  . Ibuprofen     "interacted with meds" per pt's family     Patient Measurements: Height: 5\' 4"  (162.6 cm) Weight: 145 lb (65.8 kg) IBW/kg (Calculated) : 54.7  Vital Signs: Temp: 98 F (36.7 C) (06/05 1330) BP: 175/92 (06/05 2226) Pulse Rate: 68 (06/05 2226)  Labs: Recent Labs    06/24/17 1351  HGB 13.8  HCT 40.6  PLT 184  LABPROT 12.7  INR 0.96  CREATININE 0.61  TROPONINI <0.03    Estimated Creatinine Clearance: 82.9 mL/min (by C-G formula based on SCr of 0.61 mg/dL).   Medical History: Past Medical History:  Diagnosis Date  . Anxiety   . Arthritis    "hips" (08/30/2014)  . Asthma   . Chronic lower back pain   . COPD (chronic obstructive pulmonary disease) (HCC)   . Coronary artery disease    07/23/16-STEMI PCI -->DES to RCA, Ef normal   . Depression   . GERD (gastroesophageal reflux disease)   . Hypercholesterolemia   . Hypertension   . Migraine    "a few times/month" (08/30/2014)  . NSTEMI (non-ST elevated myocardial infarction) (HCC) 08/2013   [name] 09/17/2013  . Pneumonia "several times"  . Type II diabetes mellitus (HCC)     Assessment: 47yo female c/o left hand pain w/ discoloration to three left fingers and blue fingertips, CT reveals irregular clot within right common iliac artery, admitted for further surgical/vascular evaluation, to begin heparin.  Goal of Therapy:  Heparin level 0.3-0.7 units/ml Monitor platelets by anticoagulation protocol: Yes   Plan:  Will give heparin 4000 units IV bolus x1 followed by gtt at 1200 units/hr and monitor heparin levels and CBC.  Vernard Gambles, PharmD, BCPS  06/24/2017,11:34 PM

## 2017-06-24 NOTE — ED Notes (Signed)
Pt family member at bedside came outside of the room to yell stating "what are we waiting on" and also cussing. I informed him we were waiting on the results of her scans. Pt family member continued to yell and I asked him if he wanted me to call security to have him removed. He calmed down and walked back to the pts room.

## 2017-06-24 NOTE — ED Triage Notes (Addendum)
Pt states left hand pain for several days with discoloration to left fingers, finger tips are blue. Hx of cardiac stent last year. Pulses are 3+

## 2017-06-24 NOTE — ED Notes (Addendum)
Ok for pt to have something to eat until 12 per RN Raynelle Fanning. Pt had a meal with her which this tech heated up in the microwave and pt is requesting a [place] sandwich meal. Pt is given the [place] sandwich, graham crackers, apple sauce and coke to drink.

## 2017-06-24 NOTE — ED Notes (Signed)
Pt transported to Xray. 

## 2017-06-25 ENCOUNTER — Other Ambulatory Visit: Payer: Self-pay | Admitting: Family Medicine

## 2017-06-25 ENCOUNTER — Encounter (HOSPITAL_COMMUNITY): Payer: Self-pay

## 2017-06-25 ENCOUNTER — Other Ambulatory Visit: Payer: Self-pay

## 2017-06-25 ENCOUNTER — Other Ambulatory Visit (HOSPITAL_COMMUNITY): Payer: Self-pay

## 2017-06-25 DIAGNOSIS — I10 Essential (primary) hypertension: Secondary | ICD-10-CM | POA: Diagnosis not present

## 2017-06-25 DIAGNOSIS — I70203 Unspecified atherosclerosis of native arteries of extremities, bilateral legs: Secondary | ICD-10-CM | POA: Diagnosis not present

## 2017-06-25 DIAGNOSIS — F32A Depression, unspecified: Secondary | ICD-10-CM

## 2017-06-25 DIAGNOSIS — E876 Hypokalemia: Secondary | ICD-10-CM | POA: Diagnosis not present

## 2017-06-25 DIAGNOSIS — I25119 Atherosclerotic heart disease of native coronary artery with unspecified angina pectoris: Secondary | ICD-10-CM

## 2017-06-25 DIAGNOSIS — I2510 Atherosclerotic heart disease of native coronary artery without angina pectoris: Secondary | ICD-10-CM | POA: Diagnosis not present

## 2017-06-25 DIAGNOSIS — Z716 Tobacco abuse counseling: Secondary | ICD-10-CM

## 2017-06-25 DIAGNOSIS — F329 Major depressive disorder, single episode, unspecified: Secondary | ICD-10-CM

## 2017-06-25 DIAGNOSIS — F419 Anxiety disorder, unspecified: Principal | ICD-10-CM

## 2017-06-25 DIAGNOSIS — I998 Other disorder of circulatory system: Principal | ICD-10-CM

## 2017-06-25 LAB — HEPARIN LEVEL (UNFRACTIONATED)
HEPARIN UNFRACTIONATED: 0.31 [IU]/mL (ref 0.30–0.70)
Heparin Unfractionated: 0.33 IU/mL (ref 0.30–0.70)

## 2017-06-25 LAB — COMPREHENSIVE METABOLIC PANEL
ALK PHOS: 60 U/L (ref 38–126)
ALT: 20 U/L (ref 14–54)
AST: 17 U/L (ref 15–41)
Albumin: 3.5 g/dL (ref 3.5–5.0)
Anion gap: 8 (ref 5–15)
BUN: 10 mg/dL (ref 6–20)
CO2: 25 mmol/L (ref 22–32)
CREATININE: 0.59 mg/dL (ref 0.44–1.00)
Calcium: 8.6 mg/dL — ABNORMAL LOW (ref 8.9–10.3)
Chloride: 104 mmol/L (ref 101–111)
GFR calc Af Amer: >60 mL/min (ref >60)
GFR calc non Af Amer: >60 mL/min (ref >60)
GLUCOSE: 153 mg/dL — AB (ref 65–99)
Potassium: 3.4 mmol/L — ABNORMAL LOW (ref 3.5–5.1)
SODIUM: 137 mmol/L (ref 135–145)
Total Bilirubin: 0.7 mg/dL (ref 0.3–1.2)
Total Protein: 6.1 g/dL — ABNORMAL LOW (ref 6.5–8.1)

## 2017-06-25 LAB — HEMOGLOBIN A1C
Hgb A1c MFr Bld: 5.5 % (ref 4.8–5.6)
Mean Plasma Glucose: 111.15 mg/dL

## 2017-06-25 LAB — CBC
HCT: 37.5 % (ref 36.0–46.0)
Hemoglobin: 12.8 g/dL (ref 12.0–15.0)
MCH: 32 pg (ref 26.0–34.0)
MCHC: 34.1 g/dL (ref 30.0–36.0)
MCV: 93.8 fL (ref 78.0–100.0)
PLATELETS: 188 10*3/uL (ref 150–400)
RBC: 4 MIL/uL (ref 3.87–5.11)
RDW: 15.2 % (ref 11.5–15.5)
WBC: 12.6 10*3/uL — AB (ref 4.0–10.5)

## 2017-06-25 LAB — GLUCOSE, CAPILLARY
GLUCOSE-CAPILLARY: 157 mg/dL — AB (ref 65–99)
GLUCOSE-CAPILLARY: 110 mg/dL — AB (ref 65–99)
GLUCOSE-CAPILLARY: 106 mg/dL — AB (ref 65–99)
GLUCOSE-CAPILLARY: 138 mg/dL — AB (ref 65–99)
GLUCOSE-CAPILLARY: 113 mg/dL — AB (ref 65–99)

## 2017-06-25 LAB — LIPID PANEL
CHOLESTEROL: 170 mg/dL (ref 0–200)
HDL: 51 mg/dL (ref >40)
LDL Cholesterol: 75 mg/dL (ref 0–99)
Total CHOL/HDL Ratio: 3.3 RATIO
Triglycerides: 222 mg/dL — ABNORMAL HIGH (ref <150)
VLDL: 44 mg/dL — ABNORMAL HIGH (ref 0–40)

## 2017-06-25 LAB — APTT: APTT: 100 s — AB (ref 24–36)

## 2017-06-25 LAB — PROTIME-INR
INR: 1.12
Prothrombin Time: 14.4 seconds (ref 11.4–15.2)

## 2017-06-25 LAB — HIV ANTIBODY (ROUTINE TESTING W REFLEX): HIV SCREEN 4TH GENERATION: NONREACTIVE

## 2017-06-25 LAB — MRSA PCR SCREENING: MRSA by PCR: NEGATIVE

## 2017-06-25 MED ORDER — ESCITALOPRAM OXALATE 10 MG PO TABS: 10.0000 mg | ORAL_TABLET | Freq: Every day | ORAL | 1 refills | Status: DC

## 2017-06-25 MED ORDER — ESCITALOPRAM OXALATE 10 MG PO TABS: 10.0000 mg | ORAL_TABLET | Freq: Every day | ORAL | 0 refills | Status: DC

## 2017-06-25 MED ORDER — ACETAMINOPHEN 650 MG RE SUPP: 650.0000 mg | Freq: Four times a day (QID) | RECTAL | Status: DC | PRN

## 2017-06-25 MED ORDER — ONDANSETRON HCL 4 MG PO TABS: 4.0000 mg | ORAL_TABLET | Freq: Four times a day (QID) | ORAL | Status: DC | PRN

## 2017-06-25 MED ORDER — ONDANSETRON HCL 4 MG/2ML IJ SOLN: 4.0000 mg | Freq: Four times a day (QID) | INTRAMUSCULAR | Status: DC | PRN

## 2017-06-25 MED ORDER — NICOTINE 14 MG/24HR TD PT24: 14.0000 mg | MEDICATED_PATCH | Freq: Every day | TRANSDERMAL | Status: DC | PRN

## 2017-06-25 MED ORDER — ROSUVASTATIN CALCIUM 20 MG PO TABS: 40.0000 mg | ORAL_TABLET | Freq: Every day | ORAL | 0 refills | Status: DC

## 2017-06-25 MED ORDER — NICOTINE 21 MG/24HR TD PT24: 21.0000 mg | MEDICATED_PATCH | Freq: Every day | TRANSDERMAL | 0 refills | Status: DC

## 2017-06-25 MED ORDER — ACETAMINOPHEN 325 MG PO TABS: 650.0000 mg | ORAL_TABLET | Freq: Four times a day (QID) | ORAL | Status: DC | PRN

## 2017-06-25 MED ORDER — SODIUM CHLORIDE 0.9 % IV SOLN
INTRAVENOUS | Status: DC
  Administered 2017-06-25 – 2017-06-26 (×3): via INTRAVENOUS

## 2017-06-25 MED ORDER — DIPHENHYDRAMINE HCL 25 MG PO CAPS: 50.0000 mg | ORAL_CAPSULE | Freq: Four times a day (QID) | ORAL | 0 refills | Status: DC | PRN

## 2017-06-25 MED ORDER — POTASSIUM CHLORIDE CRYS ER 20 MEQ PO TBCR: 40.0000 meq | EXTENDED_RELEASE_TABLET | Freq: Two times a day (BID) | ORAL | Status: DC

## 2017-06-25 MED ORDER — ROSUVASTATIN CALCIUM 20 MG PO TABS
40.0000 mg | ORAL_TABLET | Freq: Every day | ORAL | Status: DC
  Administered 2017-06-25 – 2017-06-26 (×2): 40 mg via ORAL
  Filled 2017-06-25 (×2): qty 2

## 2017-06-25 MED ORDER — POTASSIUM CHLORIDE 10 MEQ/100ML IV SOLN: 10.0000 meq | Freq: Once | INTRAVENOUS | Status: DC

## 2017-06-25 MED ORDER — IPRATROPIUM-ALBUTEROL 0.5-2.5 (3) MG/3ML IN SOLN: 3.0000 mL | Freq: Four times a day (QID) | RESPIRATORY_TRACT | Status: DC

## 2017-06-25 MED ORDER — MORPHINE SULFATE (PF) 4 MG/ML IV SOLN
2.0000 mg | INTRAVENOUS | Status: DC | PRN
  Administered 2017-06-25 – 2017-06-27 (×9): 2 mg via INTRAVENOUS
  Filled 2017-06-25 (×9): qty 1

## 2017-06-25 MED ORDER — ALBUTEROL SULFATE (2.5 MG/3ML) 0.083% IN NEBU: 2.5000 mg | INHALATION_SOLUTION | RESPIRATORY_TRACT | Status: DC | PRN

## 2017-06-25 MED ORDER — HYDRALAZINE HCL 20 MG/ML IJ SOLN: 10.0000 mg | Freq: Four times a day (QID) | INTRAMUSCULAR | Status: DC | PRN

## 2017-06-25 MED ORDER — IPRATROPIUM-ALBUTEROL 0.5-2.5 (3) MG/3ML IN SOLN: 3.0000 mL | Freq: Four times a day (QID) | RESPIRATORY_TRACT | Status: DC | PRN

## 2017-06-25 MED ORDER — POTASSIUM CHLORIDE CRYS ER 20 MEQ PO TBCR: 30.0000 meq | EXTENDED_RELEASE_TABLET | Freq: Once | ORAL | Status: DC

## 2017-06-25 MED ORDER — NITROGLYCERIN 0.4 MG SL SUBL: 0.4000 mg | SUBLINGUAL_TABLET | SUBLINGUAL | Status: DC | PRN

## 2017-06-25 MED ORDER — IPRATROPIUM-ALBUTEROL 20-100 MCG/ACT IN AERS: 1.0000 | INHALATION_SPRAY | Freq: Four times a day (QID) | RESPIRATORY_TRACT | 0 refills | Status: DC

## 2017-06-25 MED ORDER — FAMOTIDINE IN NACL 20-0.9 MG/50ML-% IV SOLN
20.0000 mg | Freq: Two times a day (BID) | INTRAVENOUS | Status: DC
  Administered 2017-06-25 – 2017-06-26 (×5): 20 mg via INTRAVENOUS
  Filled 2017-06-25 (×5): qty 50

## 2017-06-25 MED ORDER — POTASSIUM CHLORIDE CRYS ER 20 MEQ PO TBCR
30.0000 meq | EXTENDED_RELEASE_TABLET | Freq: Two times a day (BID) | ORAL | Status: DC
  Administered 2017-06-25 – 2017-06-27 (×4): 30 meq via ORAL
  Filled 2017-06-25 (×5): qty 1

## 2017-06-25 MED ORDER — INSULIN ASPART 100 UNIT/ML ~~LOC~~ SOLN
0.0000 [IU] | Freq: Three times a day (TID) | SUBCUTANEOUS | Status: DC
  Administered 2017-06-26 (×2): 1 [IU] via SUBCUTANEOUS

## 2017-06-25 MED ORDER — ASPIRIN EC 81 MG PO TBEC: 81.0000 mg | DELAYED_RELEASE_TABLET | Freq: Every day | ORAL | 0 refills | Status: DC

## 2017-06-25 NOTE — Progress Notes (Addendum)
ANTICOAGULATION CONSULT NOTE  Pharmacy Consult for heparin Indication: ischemic finger and common iliac clot  Allergies  Allergen Reactions  . Hydrocodone-Acetaminophen Itching  . Ibuprofen     "interacted with meds" per pt's family     Patient Measurements: Height: 5\' 4"  (162.6 cm) Weight: 155 lb 13.8 oz (70.7 kg) IBW/kg (Calculated) : 54.7  Vital Signs: Temp: 98.6 F (37 C) (06/06 0700) Temp Source: Oral (06/06 0700) BP: 140/80 (06/06 0700) Pulse Rate: 66 (06/06 0700)  Labs: Recent Labs    06/24/17 1351 06/25/17 0400 06/25/17 0652  HGB 13.8 12.8  --   HCT 40.6 37.5  --   PLT 184 188  --   APTT  --  100*  --   LABPROT 12.7 14.4  --   INR 0.96 1.12  --   HEPARINUNFRC  --   --  0.33  CREATININE 0.61 0.59  --   TROPONINI <0.03  --   --     Estimated Creatinine Clearance: 85.7 mL/min (by C-G formula based on SCr of 0.59 mg/dL).   Medical History: Past Medical History:  Diagnosis Date  . Anxiety   . Arthritis    "hips" (08/30/2014)  . Asthma   . Chronic lower back pain   . COPD (chronic obstructive pulmonary disease) (HCC)   . Coronary artery disease    07/23/16-STEMI PCI -->DES to RCA, Ef normal   . Depression   . GERD (gastroesophageal reflux disease)   . Hypercholesterolemia   . Hypertension   . Migraine    "a few times/month" (08/30/2014)  . NSTEMI (non-ST elevated myocardial infarction) (HCC) 08/2013   [name] 09/17/2013  . Pneumonia "several times"  . Type II diabetes mellitus (HCC)     Assessment: 47yo female c/o left hand pain w/ discoloration to three left fingers and blue fingertips, CT reveals irregular clot within right common iliac artery, admitted for further surgical/vascular evaluation on heparin -initial heparin level is at goal   Goal of Therapy:  Heparin level 0.3-0.7 units/ml Monitor platelets by anticoagulation protocol: Yes   Plan:  -No heparin changes needed -Will confirm heparin level later today -Daily heparin level and  CBC  Harland German, PharmD Clinical Pharmacist Clinical phone from 8:30-4:00 is [phone] After 4pm, please call Main Rx ([phone]) for assistance. 06/25/2017 9:15 AM  Addendum -heparin level is on the low end of goal  Plan -Increase heparin to 1250 units/hr -Recheck in am  Harland German, PharmD Clinical Pharmacist Clinical phone from 8:30-4:00 is [phone] After 4pm, please call Main [facility] [phone]) for assistance. 06/25/2017 1:47 PM

## 2017-06-25 NOTE — Progress Notes (Signed)
Printing Rx so we can fax to [facility] for patient in the event she gets discharged prior to Monday.

## 2017-06-25 NOTE — ED Notes (Signed)
Pt alert  VS documented and call light within reach

## 2017-06-25 NOTE — Progress Notes (Signed)
Family Medicine progress update  Saw and evaluated patient this am. Still with pain in hand, although pain well controlled with IV morphine. Discussed at bedside with vascular surgery team. Will opt for conservative management at this point. Continue IV heparin with frequent vascular checks of hand. Plan unchanged from admission note. Exam unchanged from admission note earlier this am. Will continue to follow, appreciate vascular surgery recs.  Myrene Buddy MD PGY-1 Family Medicine Resident

## 2017-06-25 NOTE — Progress Notes (Signed)
[facility]ist [facility]  Patient name: Krista Alvarez MRN: [id number] DOB: August 06, 1970 Sex: female   REQUESTING PROVIDER:    ER   REASON FOR CONSULT:    Ischemic fingers  HISTORY OF PRESENT ILLNESS:   Krista Alvarez is a 47 y.o. female, who presented to the emergency department yesterday with complaints of pain and bluish discoloration and her third fourth and fifth left fingers.  She had a CT angiogram which did not any obvious etiology within the chest or right upper extremity.  Incidentally, she was found to have bilateral iliac lesions that could potentially represent embolic thrombus.  The patient was started on IV heparin and admitted to the hospital.  The patient does endorse palpitations in the past several days.  She does not carry a diagnosis of an arrhythmia  Patient has a history of coronary artery disease and has previously undergone stenting.  She is a long-term smoker.  Patient is medically managed for hypertension.  This is not been well controlled.  She has a history of COPD.  [name]  She has a history of hypertriglyceridemia.  She is a type II diabetic.  This appears to be well controlled based on pastHemoglobin A1c levels.   PAST MEDICAL HISTORY    Past Medical History:  Diagnosis Date  . Anxiety   . Arthritis    "hips" (08/30/2014)  . Asthma   . Chronic lower back pain   . COPD (chronic obstructive pulmonary disease) (HCC)   . Coronary artery disease    07/23/16-STEMI PCI -->DES to RCA, Ef normal   . Depression   . GERD (gastroesophageal reflux disease)   . Hypercholesterolemia   . Hypertension   . Migraine    "a few times/month" (08/30/2014)  . NSTEMI (non-ST elevated myocardial infarction) (HCC) 08/2013   [name] 09/17/2013  . Pneumonia "several times"  . Type II diabetes mellitus (HCC)      FAMILY HISTORY   Family History  Problem Relation Age of Onset  . Heart attack Mother   . Heart attack Father      SOCIAL HISTORY:   Social History   Socioeconomic History  . Marital status: Married    Spouse name: Not on file  . Number of children: Not on file  . Years of education: Not on file  . Highest education level: Not on file  Occupational History  . Not on file  Social Needs  . Financial resource strain: Not on file  . Food insecurity:    Worry: Not on file    Inability: Not on file  . Transportation needs:    Medical: Not on file    Non-medical: Not on file  Tobacco Use  . Smoking status: Current Every Day Smoker    Packs/day: 1.00    Years: 30.00    Pack years: 30.00    Types: Cigarettes  . Smokeless tobacco: Never Used  Substance and Sexual Activity  . Alcohol use: Yes    Comment: 08/30/2014 "I drink a couple times/month"  . Drug use: No    Types: Marijuana    Comment: "back in my younger days"  . Sexual activity: Yes  Lifestyle  . Physical activity:    Days per week: Not on file    Minutes per session: Not on file  . Stress: Not on file  Relationships  . Social connections:    Talks on phone: Not on file    Gets together: Not on file    Attends  religious service: Not on file    Active member of club or organization: Not on file    Attends meetings of clubs or organizations: Not on file    Relationship status: Not on file  . Intimate partner violence:    Fear of current or ex partner: Not on file    Emotionally abused: Not on file    Physically abused: Not on file    Forced sexual activity: Not on file  Other Topics Concern  . Not on file  Social History Narrative  . Not on file    ALLERGIES:    Allergies  Allergen Reactions  . Hydrocodone-Acetaminophen Itching  . Ibuprofen     "interacted with meds" per pt's family     CURRENT MEDICATIONS:    Current Facility-Administered Medications  Medication Dose Route Frequency Provider Last Rate Last Dose  . 0.9 %  sodium chloride infusion   Intravenous Continuous Myrene Buddy, MD 75 mL/hr at  06/25/17 0600    . acetaminophen (TYLENOL) tablet 650 mg  650 mg Oral Q6H PRN Myrene Buddy, MD       Or  . acetaminophen (TYLENOL) suppository 650 mg  650 mg Rectal Q6H PRN Myrene Buddy, MD      . famotidine (PEPCID) IVPB 20 mg premix  20 mg Intravenous Q12H Myrene Buddy, MD 100 mL/hr at 06/25/17 1031 20 mg at 06/25/17 1031  . heparin ADULT infusion 100 units/mL (25000 units/24mL sodium chloride 0.45%)  1,200 Units/hr Intravenous Continuous Myrene Buddy, MD 12 mL/hr at 06/25/17 0600 1,200 Units/hr at 06/25/17 0600  . hydrALAZINE (APRESOLINE) injection 10 mg  10 mg Intravenous Q6H PRN Myrene Buddy, MD      . insulin aspart (novoLOG) injection 0-9 Units  0-9 Units Subcutaneous TID WC Myrene Buddy, MD      . ipratropium-albuterol (DUONEB) 0.5-2.5 (3) MG/3ML nebulizer solution 3 mL  3 mL Nebulization Q6H PRN Myrene Buddy, MD      . morphine 4 MG/ML injection 2 mg  2 mg Intravenous Q4H PRN Myrene Buddy, MD   2 mg at 06/25/17 0846  . nicotine (NICODERM CQ - dosed in mg/24 hours) patch 14 mg  14 mg Transdermal Daily PRN Myrene Buddy, MD      . nitroGLYCERIN (NITROSTAT) SL tablet 0.4 mg  0.4 mg Sublingual Q5 min PRN Myrene Buddy, MD      . ondansetron [facility]) tablet 4 mg  4 mg Oral Q6H PRN Myrene Buddy, MD       Or  . ondansetron [facility]) injection 4 mg  4 mg Intravenous Q6H PRN Myrene Buddy, MD      . potassium chloride 10 mEq in 100 mL IVPB  10 mEq Intravenous Once Myrene Buddy, MD      . rosuvastatin (CRESTOR) tablet 40 mg  40 mg Oral q1800 Lockamy, Timothy, DO        REVIEW OF SYSTEMS:   [X]  denotes positive finding, [ ]  denotes negative finding Cardiac  Comments:  Chest pain or chest pressure:    Shortness of breath upon exertion:    Short of breath when lying flat:    Irregular heart rhythm:        Vascular    Pain in calf, thigh, or hip brought on by ambulation:    Pain in feet at night that wakes you up from your sleep:     Blood clot in your  veins:    Leg swelling:         Pulmonary  Oxygen at home:    Productive cough:     Wheezing:             Sudden weakness in arms or legs:     Sudden numbness in arms or legs:     Sudden onset of difficulty speaking or slurred speech:    Temporary loss of vision in one eye:     Problems with dizziness:         Gastrointestinal    Blood in stool:      Vomited blood:         Genitourinary    Burning when urinating:     Blood in urine:        Psychiatric    Major depression:         Hematologic    Bleeding problems:    Problems with blood clotti palpable ng too easily:        Skin    Rashes or ulcers:        Constitutional    Fever or chills:     PHYSICAL EXAM:   Vitals:   06/25/17 0300 06/25/17 0345 06/25/17 0700 06/25/17 1103  BP: 117/68 (!) 158/98 140/80 139/80  Pulse: 68 60 66 70  Resp: 11 13 17 16   Temp:  97.8 F (36.6 C) 98.6 F (37 C) 98 F (36.7 C)  TempSrc:  Oral Oral Oral  SpO2: 95% 99% 98% 99%  Weight:  155 lb 13.8 oz (70.7 kg)    Height:  5\' 4"  (1.626 m)      GENERAL: The patient is a well-nourished female, in no acute distress. The vital signs are documented above. CARDIAC: There is a regular rate and rhythm.  VASCULAR: Bilateral dorsalis pedis pulses.  Palpable left radial and ulnar artery pulse PULMONARY: Nonlabored respirations ABDOMEN: Soft and non-tender with normal pitched bowel sounds.  MUSCULOSKELETAL: There are no major deformities or cyanosis. NEUROLOGIC: No focal weakness or paresthesias are detected. SKIN: There are no ulcers or rashes noted. PSYCHIATRIC: The patient has a normal affect.  STUDIES:   I have ordered and reviewed her CT angiogram with the following findings: 1. Patent left upper extremity arteries to the level of the palmar arches. The digital arteries are not well evaluated. 1. No evidence of pulmonary embolus. 2. No evidence of aortic dissection. No evidence of aneurysmal dilatation. 3. Minimal bilateral  atelectasis noted; lungs otherwise clear. 4. Irregular clot within the right common iliac artery, resulting in moderate luminal narrowing. The clot appears unstable. Small focal clot noted at the distal left common iliac artery, also unstable in appearance, resulting in moderate to severe luminal narrowing. 5. 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 6. Tiny left renal cyst noted.   Vascular Ultrasound Left Upper Extremity Arterial Duplex has been completed.   There is no evidence of aneurysm or stenosis involving the visualized arteries of the left upper extremity. Waveforms become dampened at proximal radial and ulnar arteries without visualized etiology; distal brachial artery is patent with triphasic waveforms.   Left Upper Extremity Arterial Doppler with Digit Pressures completed.  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  She needs  to have a echo to evaluate for intracardiac source.  Currently, she is asymptomatic with regards to her iliac lesions.  The best way to address these would be with bilateral femoral embolectomies, however with her being asymptomatic and having palpable pedal pulses, she may be able to get by with just anticoagulation.  The biggest concern I have is her fingers.  She has palpable radial and ulnar artery pulses at the wrist.  I do not think she would benefit from surgical thrombectomy.  The only potential treatment option would be for initiation of lytic therapy, however I suspect the utility of this will be limited.  In the meantime, we will await the results of her echocardiogram and continue her IV heparin.   Durene Cal, MD [facility] Specialists of  [facility] [phone] Pager [phone]

## 2017-06-25 NOTE — ED Notes (Signed)
Attempted report and the RN could not be located and would not answer her phone.

## 2017-06-25 NOTE — Care Management Note (Addendum)
Case Management Note  Patient Details  Name: Krista Alvarez MRN: [id number] Date of Birth: 12/10/1970  Subjective/Objective:  Pt admitted with ischemic fingers                     Action/Plan:  PTA independent from home.  Pt is already active with [facility] - CM secured appt for July 1 at 2:30pm.  Pt informed CM that she will use [facility] pharmacy at discharge and has used pharmacy I the past.  Pt informed CM that she has already used the free 30 day card for Brilinta.  CM contacted [facility] pharmacy - pt has not had prescriptions filled there since 07/2016.  Pt does not have medication assistance (blue card) nor clinic copay card (orange card).  Pt will need to go to clinic location and request appt with financial counselor (CM unable to make appt) to apply for blue card/orange card.  Pt will also need to fill out application for orange card (for clinic visits).  If pt is discharged during clinic hours; pt can get home medications for $4-$10 and if she cant afford copay it can be charged once.  Also, pt will need to meet with Krista Alvarez at the pharmacy to complete assistance forms for Brilinta and Combivent which will then cost her only $10 - this too can be charged.  CM shared the above information with the pt and also placed on the AVS.   Expected Discharge Date:                  Expected Discharge Plan:  Home/Self Care  In-House Referral:     Discharge planning Services  CM Consult  Post Acute Care Choice:    Choice offered to:     DME Arranged:    DME Agency:     HH Arranged:    HH Agency:     Status of Service:     If discussed at [employer], dates discussed:    Additional Comments: 06/25/17 CM spoke with attending and requested all discharge medications known at this time to be faxed to [facility] so arrangements could be made prior to weekend Krista Alvarez, [place] 06/25/2017, 2:00 PM

## 2017-06-25 NOTE — Progress Notes (Signed)
Printing out Rx to send to [facility] for patient in event that she gets discharged over the weekend.

## 2017-06-25 NOTE — H&P (Addendum)
Family Medicine Teaching [facility] Admission History and Physical Service Pager: [phone]  Patient name: Krista Alvarez Medical record number: [id number] Date of birth: 1971-01-16 Age: 47 y.o. Gender: female  Primary Care Provider: Loletta Specter, PA-C Consultants: Vascular surgery Code Status: full  Chief Complaint: Pain in left hand  Assessment and Plan: Krista Alvarez is a 47 y.o. female presenting with 4 day history of increasing pain and 2 day history of progressive blue discoloration of left hand.   Left hand pain and discoloration Patient with progression, and symptoms consistent with ischemia to her left hand. Given that only the 3rd, 4th, and 5th digits are affected the etiology consistent with flow problem in ulnar artery vs medial palmar arch problem. No apparent stenosis or large clot on CT Angio of LUE. Patient with triphasic signals up to PIP joint in each of these digits. Unclear if flow stops due to limitation of bedside doppler in digital vessels or if significant flow problem to these areas. Decreased perfusion of 4th digit on arterial duplex. 4th digit also most significantly affected on exam. Patient is certainly a candidate for PAD given her 30 pack year smoking history and history of diabetes. Other possible etiologies for this presentation are septic emboli, reynaud's syndrome, and buerger's disease. A septic emboli with no systemic symptoms and this pattern of discoloration is unlikely. Echo will also help rule this out. Reynaud's syndrome does not fit well with her history or physical exam. The patient does fit initial screening criteria for buerger's disease. She is 47 years old or less, smokes, and does not have apparent proximal cause in upper extremity. Per Vascular recommendations will admit, place on heparin gtt, and get echo. Will defer management to vascular surgery, but could consider topical vasodilator or calcium channel blocker. - admit to family  medicine teaching service, Dr. 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 Likely due to moderate to severe luminal narrowing of bilateral common iliacs. Palpable PT/DP bilaterally and triphasic signals in those digits. Medical management at this time with heparin gtt. Vascular surgery following, appreciate their input on possible intervention. PT/OT after any possible intervention and before discharge. - heparin gtt as above - appreciate vascular surgery recs  Coronary artery disease  H/O STEMI  Stent Placement Patient with STEMI in 07/2016 s/p stent placement. Had 99% occlusion of RCA. EF rebounded to 5-55% after procedure from 35-45%. Patient has been taking aspirin daily but ran out of brillinta after only one month. Has not been taking this medication for last 10 months. Risk factors such as lipid panel and a1c w/n/l on 07/2016. Patient currently on heparin gtt. Will hold aspirin and brillinta until decision is made for surgery vs medical management. Sending off a1c and lipid panel. Likely will need to go back on brillinta and aspirin at discharge. Will ask for CM to help get these medications as patient cannot afford them. - follow up lipid panel, a1c - holding asa  and brilinta for now - cm to help with affordability of meds prior to dc - nitrostat prn for chest pain  Hypertension Patient with hypertension on  arrival up to 175/92. Currently 117/68. Has metoprolol on outpatient medication regimen but has not been taking. Unclear if this is for afib or hypertension. Patient unsure. Likely could benefit from ACE or ARB given her diagnosis of T2DM in the chart. Will not start those medications at this time until surgical plan becomes clear. Given the large amount of contrast she might receive in a potential angiogram/thrombectomy, ACE/ARB contraindicated. Beta blocker contraindicated at this time as patient's heart rates have been in mid50s to low 60s. - holding metorprolol - ace/arb contraindicated - prn hydralazine for sbp > 160  COPD History of COPD. Says that she had a spirograph done by a pulmonologist at some point but cannot remember where. No records of this in our system or care everywhere. She says that she was told she had copd. Notes dating back to 2013 mention this but unclear if actual tests were transferred to emr. Prescribed combivent inhaler but ran out and has not been taking. Says that breathing treatments do help her sometimes. Scattered wheezing on exam. Will restart combivent while admitted. Albuterol prn for wheezing. CM to assist with medication cost.  Type II diabetes Carries this diagnosis in her chart. Peak a1c 8.0 in 08/2013. Most recent a1c 5.3 in 07/2016. Rechecking A1C. Pending that result could consider metformin as an outpatient. Can start sliding scale if needed, glucose from cmp 131. - sSSI - follow up a1c  Hyperlipidemia Patient carries this diagnosis in her chart. Last lipid panel in 07/2016 with elevated TGs up to 304. Patient does not appear to be on any medication for this. Recheck lipid panel, consider starting statin pending results. - follow up lipid panel  Tobacco dependence Likely the patient's biggest obstacle in the long term. Has a 30 pack year smoking history. Does not want nicotine patch at this time. Smoking 1/2 PPD recently. Can start 14mg  patch if needed for  cravings.  Anxiety/Depression Was trialed on lexapro for this. Patinet has not taken in several months. Hasn't felt she has had any issues with this in a long time.  PMH is significant for CAD, STEMI s/p stent placement, HTN, HLD, T2DM, tobacco dependence  FEN/GI:  Prophylaxis: heparin gtt  Disposition: likely home pending clinical course  History of Present Illness:  Krista Alvarez is a 47 y.o. female who presents due to hand discoloration and pain. Patient states that her left hand pain initially started on 6/2 as a numbness and tingling in each of those digits. The numbness progressively turned into a dull ache over the course of the next day and a half. The dull ache turned into a sharp pain and she also started to notice blue color change in her 3rd, 4th, and 5th digits of her left hand at this time. She tried ice and warm towels to relieve the pain but she did not get relief. When the blue discoloration and pain continued to get worse she decided to come to [facility]. She has no loss of function in the these digits. They are painful to touch and become painful with movement. She has never had symptoms like this before. Does not have digit discoloration when encountering cold temperatures.  Patient also describes symptoms of bilateral lower extremity pain with walking. She has been a thirty year smoker. Recently cut down to only a half pack  per day. Describes symptoms consistent with palpitations infrequently over the last year. Most recently noticed her "heart beating really fast" a couple of days ago.  Workup in the ED consisted of a cmp, cbc, trop, pt/inr, pregnancy test, CT angiography of left arm, and chest abdomen and pelvis, 2 view chest xray,  Arterial duplex of upper extremity,. CMP, cbc were without abnormality. Negative troponin, pt/inr 12.7/0.96. CT angio of left arm showed patent upper extremity arteries to level of palmar arches. 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 CT C/A/P showed an irregular clot within the right common illiac artery with moderate luminal narrowing. Small clot in Left common illiac with moderate to severe luminal narrowing. Left upper extremity arterial doppler showed diminished perfusion to 4th digit of unknown etiology.  Case discussed with vascular surgery per ED provider. Their recommendations are to start heparin gtt, get an echo, pain control, and they will evaluate in person in the morning.  Review Of Systems: Per HPI with the following additions:  Review of Systems  Constitutional: Negative for chills, diaphoresis and fever.  HENT: Negative for congestion and sinus pain.   Eyes: Negative for photophobia and pain.  Cardiovascular: Positive for palpitations and claudication. Negative for chest pain, orthopnea and leg swelling.  Gastrointestinal: Negative for abdominal pain, constipation, diarrhea, nausea and vomiting.  Genitourinary: Negative for dysuria and urgency.  Neurological: Negative for dizziness, focal weakness and headaches.    Patient Active Problem List   Diagnosis Date Noted  . Ischemia of digits of hand 06/25/2017  . Hyperlipidemia 07/25/2016  . Hypertension 07/25/2016  . STEMI involving right coronary artery (HCC) 07/23/2016  . Acute inferoposterior myocardial infarction (HCC) 07/23/2016  . CAD (coronary artery disease)   . Type 2 diabetes mellitus without complication (HCC)   . Tobacco abuse   . COLD (chronic obstructive lung disease) (HCC)   . Pain in the chest   . Chest pain 08/30/2014  . TOBACCO DEPENDENCE 03/19/2006  . PARESTHESIA NOS 03/19/2006    Past Medical History: Past Medical History:  Diagnosis Date  . Anxiety   . Arthritis    "hips" (08/30/2014)  . Asthma   . Chronic lower back pain   . COPD (chronic obstructive pulmonary disease) (HCC)   . Coronary artery disease    07/23/16-STEMI PCI -->DES to RCA, Ef normal   . Depression   . GERD  (gastroesophageal reflux disease)   . Hypercholesterolemia   . Hypertension   . Migraine    "a few times/month" (08/30/2014)  . NSTEMI (non-ST elevated myocardial infarction) (HCC) 08/2013   [name] 09/17/2013  . Pneumonia "several times"  . Type II diabetes mellitus (HCC)     Past Surgical History: Past Surgical History:  Procedure Laterality Date  . CARDIAC CATHETERIZATION    . CARDIAC CATHETERIZATION N/A 08/31/2014   Procedure: Left Heart Cath and Coronary Angiography;  Surgeon: Kathleene Hazel, MD;  Location[facility] INVASIVE CV LAB;  Service: Cardiovascular;  Laterality: N/A;  . CORONARY ANGIOPLASTY    . CORONARY/GRAFT ACUTE MI REVASCULARIZATION N/A 07/23/2016   Procedure: Coronary/Graft Acute MI Revascularization;  Surgeon: Corky Crafts, MD;  Location: [facility] INVASIVE CV LAB;  Service: Cardiovascular;  Laterality: N/A;  . LEFT HEART CATH AND CORONARY ANGIOGRAPHY N/A 07/23/2016   Procedure: Left Heart Cath and Coronary Angiography;  Surgeon: Corky Crafts, MD;  Location: [facility] INVASIVE CV LAB;  Service: Cardiovascular;  Laterality: N/A;  . PERCUTANEOUS PINNING PHALANX FRACTURE OF HAND Left    "  plate & pins"  . TONSILLECTOMY      Social History: Social History   Tobacco Use  . Smoking status: Current Every Day Smoker    Packs/day: 1.00    Years: 30.00    Pack years: 30.00    Types: Cigarettes  . Smokeless tobacco: Never Used  Substance Use Topics  . Alcohol use: Yes    Comment: 08/30/2014 "I drink a couple times/month"  . Drug use: No    Types: Marijuana    Comment: "back in my younger days"   Additional social history:   Please also refer to relevant sections of EMR.  Family History: Family History  Problem Relation Age of Onset  . Heart attack Mother   . Heart attack Father     Allergies and Medications: Allergies  Allergen Reactions  . Hydrocodone-Acetaminophen Itching  . Ibuprofen     "interacted with meds" per pt's family    No current  facility-administered medications on file prior to encounter.    Current Outpatient Medications on File Prior to Encounter  Medication Sig Dispense Refill  . acetaminophen (TYLENOL) 325 MG tablet Take 975-1,300 mg by mouth every 8 (eight) hours as needed (for pain or headaches).     [name] aspirin EC 81 MG tablet Take 1 tablet (81 mg total) by mouth daily.    . diphenhydrAMINE (BENADRYL) 25 mg capsule Take 50 mg by mouth every 6 (six) hours as needed for allergies.    . Ipratropium-Albuterol (COMBIVENT RESPIMAT) 20-100 MCG/ACT AERS respimat Inhale 1 puff into the lungs 4 (four) times daily.     [name] escitalopram (LEXAPRO) 10 MG tablet Take 1 tablet (10 mg total) by mouth daily. (Patient not taking: Reported on 06/24/2017) 30 tablet 1  . metoprolol tartrate (LOPRESSOR) 25 MG tablet Take 0.5 tablets (12.5 mg total) by mouth 2 (two) times daily. (Patient not taking: Reported on 06/24/2017) 30 tablet 1  . nicotine (NICODERM CQ - DOSED IN MG/24 HOURS) 21 mg/24hr patch Place 1 patch (21 mg total) onto the skin daily. (Patient not taking: Reported on 06/24/2017) 28 patch 0  . omega-3 acid ethyl esters (LOVAZA) 1 g capsule Take 1 capsule (1 g total) by mouth 2 (two) times daily. (Patient not taking: Reported on 06/24/2017) 60 capsule 2  . ticagrelor (BRILINTA) 90 MG TABS tablet Take 1 tablet (90 mg total) by mouth 2 (two) times daily. (Patient not taking: Reported on 06/24/2017) 60 tablet 0    Objective: BP (!) 140/98 (BP Location: Right Arm)   Pulse 62   Temp 98 F (36.7 C)   Resp 20   Ht 5\' 4"  (1.626 m)   Wt 145 lb (65.8 kg)   LMP 05/24/2017   SpO2 98%   BMI 24.89 kg/m  Exam: General: alert, awake, caucasian female in no acute distress, resting in bed Eyes: eomi, perrla ENTM: no external trauma to bilateral ears or nose Neck: range of motion intact Cardiovascular: rrr, no m/r/g. Palpable pt/dp bilaterally. Palpable radial and ulnar pulse bilaterally. Triphasic waveforms throughout palmar arch of left hand.  Biphasic signal up to PIP in 3rd, 4th, 5th digit of left hand. No signal by doppler beyond that for each digit. Respiratory: scattered wheezing in RUL and LUL. Lungs clear to auscultation beyond that. No increased work of breathing. Gastrointestinal: soft, non-tender, non-distended. MSK: bluish discoloration of 3rd, 4th, 5th digit of left hand. Worst in 4th digit fingertips. Very tender to palpation in each of those digits. Able to flex and extend those  digits with some effort and noticeable pain. Otherwise 5/5 strength BUE, BLE Derm: bluish discoloration of left hand as above. Scratch noted on right arm. Neuro: cn 2-12 intact, no focal neuro deficit. Sensation intact 3rd, 4th, 5th digit left hand. Psych: alert, oriented x3. Appropriate, pleasant  Labs and Imaging: CBC BMET  Recent Labs  Lab 06/24/17 1351  WBC 12.7*  HGB 13.8  HCT 40.6  PLT 184   Recent Labs  Lab 06/24/17 1351  NA 134*  K 3.7  CL 100*  CO2 22  BUN 11  CREATININE 0.61  GLUCOSE 131*  CALCIUM 9.2      Myrene Buddy, MD 06/25/2017, 1:28 AM PGY-1, [facility] Family Medicine FPTS Intern pager: [phone], text pages welcome  UPPER LEVEL ADDENDUM  I have read the above note and made revisions highlighted in orange.  Tarri Abernethy, MD, MPH PGY-3 [facility] Pager [phone]

## 2017-06-26 ENCOUNTER — Encounter (HOSPITAL_COMMUNITY): Payer: Self-pay | Admitting: Surgery

## 2017-06-26 ENCOUNTER — Telehealth: Payer: Self-pay | Admitting: Surgery

## 2017-06-26 ENCOUNTER — Encounter (HOSPITAL_COMMUNITY)
Admission: EM | Disposition: A | Discharge status: Home / Self Care | Payer: Self-pay | Source: Home / Self Care | Attending: Family Medicine

## 2017-06-26 ENCOUNTER — Inpatient Hospital Stay (HOSPITAL_COMMUNITY): Payer: Self-pay

## 2017-06-26 DIAGNOSIS — I10 Essential (primary) hypertension: Secondary | ICD-10-CM | POA: Diagnosis not present

## 2017-06-26 DIAGNOSIS — I998 Other disorder of circulatory system: Secondary | ICD-10-CM | POA: Diagnosis not present

## 2017-06-26 DIAGNOSIS — I70203 Unspecified atherosclerosis of native arteries of extremities, bilateral legs: Secondary | ICD-10-CM | POA: Diagnosis not present

## 2017-06-26 DIAGNOSIS — E876 Hypokalemia: Secondary | ICD-10-CM

## 2017-06-26 DIAGNOSIS — I214 Non-ST elevation (NSTEMI) myocardial infarction: Secondary | ICD-10-CM

## 2017-06-26 HISTORY — PX: UPPER EXTREMITY ANGIOGRAPHY: CATH118270

## 2017-06-26 LAB — CBC
HEMATOCRIT: 34.7 % — AB (ref 36.0–46.0)
Hemoglobin: 11.5 g/dL — ABNORMAL LOW (ref 12.0–15.0)
MCH: 31.7 pg (ref 26.0–34.0)
MCHC: 33.1 g/dL (ref 30.0–36.0)
MCV: 95.6 fL (ref 78.0–100.0)
Platelets: 142 10*3/uL — ABNORMAL LOW (ref 150–400)
RBC: 3.63 MIL/uL — ABNORMAL LOW (ref 3.87–5.11)
RDW: 15.1 % (ref 11.5–15.5)
WBC: 9.5 10*3/uL (ref 4.0–10.5)

## 2017-06-26 LAB — ECHOCARDIOGRAM COMPLETE
HEIGHTINCHES: 64 in
Weight: 2564.39 oz

## 2017-06-26 LAB — POTASSIUM: POTASSIUM: 4.2 mmol/L (ref 3.5–5.1)

## 2017-06-26 LAB — GLUCOSE, CAPILLARY
GLUCOSE-CAPILLARY: 124 mg/dL — AB (ref 65–99)
GLUCOSE-CAPILLARY: 123 mg/dL — AB (ref 65–99)
Glucose-Capillary: 103 mg/dL — ABNORMAL HIGH (ref 65–99)

## 2017-06-26 LAB — HEPARIN LEVEL (UNFRACTIONATED): Heparin Unfractionated: 0.35 IU/mL (ref 0.30–0.70)

## 2017-06-26 LAB — POCT ACTIVATED CLOTTING TIME: Activated Clotting Time: 114 seconds

## 2017-06-26 SURGERY — UPPER EXTREMITY ANGIOGRAPHY
Anesthesia: LOCAL | Laterality: Left

## 2017-06-26 MED ORDER — SODIUM CHLORIDE 0.9 % IV SOLN: 250.0000 mL | INTRAVENOUS | Status: DC | PRN

## 2017-06-26 MED ORDER — ALTEPLASE 2 MG IJ SOLR
10.0000 mg | Freq: Once | INTRAMUSCULAR | Status: AC
  Administered 2017-06-26: 10 mg
  Filled 2017-06-26 (×2): qty 10

## 2017-06-26 MED ORDER — SODIUM CHLORIDE 0.9% FLUSH
3.0000 mL | Freq: Two times a day (BID) | INTRAVENOUS | Status: DC
  Administered 2017-06-26: 3 mL via INTRAVENOUS

## 2017-06-26 MED ORDER — ONDANSETRON HCL 4 MG/2ML IJ SOLN: 4.0000 mg | Freq: Four times a day (QID) | INTRAMUSCULAR | Status: DC | PRN

## 2017-06-26 MED ORDER — MIDAZOLAM HCL 2 MG/2ML IJ SOLN
INTRAMUSCULAR | Status: AC
  Filled 2017-06-26: qty 2

## 2017-06-26 MED ORDER — MIDAZOLAM HCL 2 MG/2ML IJ SOLN
INTRAMUSCULAR | Status: DC | PRN
  Administered 2017-06-26: 2 mg via INTRAVENOUS

## 2017-06-26 MED ORDER — HYDRALAZINE HCL 20 MG/ML IJ SOLN: 5.0000 mg | INTRAMUSCULAR | Status: DC | PRN

## 2017-06-26 MED ORDER — FENTANYL CITRATE (PF) 100 MCG/2ML IJ SOLN
INTRAMUSCULAR | Status: AC
  Filled 2017-06-26: qty 2

## 2017-06-26 MED ORDER — LIDOCAINE HCL (PF) 1 % IJ SOLN
INTRAMUSCULAR | Status: AC
Start: 2017-06-26 — End: ?
  Filled 2017-06-26: qty 30

## 2017-06-26 MED ORDER — SODIUM CHLORIDE 0.9 % WEIGHT BASED INFUSION
1.0000 mL/kg/h | INTRAVENOUS | Status: AC
  Administered 2017-06-26: 1 mL/kg/h via INTRAVENOUS

## 2017-06-26 MED ORDER — ACETAMINOPHEN 325 MG PO TABS
650.0000 mg | ORAL_TABLET | ORAL | Status: DC | PRN
  Filled 2017-06-26: qty 2

## 2017-06-26 MED ORDER — LABETALOL HCL 5 MG/ML IV SOLN: 10.0000 mg | INTRAVENOUS | Status: DC | PRN

## 2017-06-26 MED ORDER — IODIXANOL 320 MG/ML IV SOLN
INTRAVENOUS | Status: DC | PRN
  Administered 2017-06-26: 30 mL via INTRAVENOUS

## 2017-06-26 MED ORDER — FENTANYL CITRATE (PF) 100 MCG/2ML IJ SOLN
INTRAMUSCULAR | Status: DC | PRN
  Administered 2017-06-26: 50 ug via INTRAVENOUS

## 2017-06-26 MED ORDER — LIDOCAINE HCL (PF) 1 % IJ SOLN
INTRAMUSCULAR | Status: DC | PRN
  Administered 2017-06-26: 2 mL via SUBCUTANEOUS

## 2017-06-26 MED ORDER — ASPIRIN EC 81 MG PO TBEC
81.0000 mg | DELAYED_RELEASE_TABLET | Freq: Every day | ORAL | Status: DC
  Administered 2017-06-26 – 2017-06-27 (×2): 81 mg via ORAL
  Filled 2017-06-26 (×2): qty 1

## 2017-06-26 MED ORDER — NITROGLYCERIN 1 MG/10 ML FOR IR/CATH LAB
INTRA_ARTERIAL | Status: AC
  Filled 2017-06-26: qty 10

## 2017-06-26 MED ORDER — HEPARIN (PORCINE) IN NACL 100-0.45 UNIT/ML-% IJ SOLN
1250.0000 [IU]/h | INTRAMUSCULAR | Status: DC
  Administered 2017-06-26: 1250 [IU]/h via INTRAVENOUS
  Filled 2017-06-26: qty 250

## 2017-06-26 MED ORDER — NITROGLYCERIN 1 MG/10 ML FOR IR/CATH LAB
INTRA_ARTERIAL | Status: DC | PRN
  Administered 2017-06-26: 600 ug via INTRA_ARTERIAL

## 2017-06-26 MED ORDER — SODIUM CHLORIDE 0.9% FLUSH: 3.0000 mL | INTRAVENOUS | Status: DC | PRN

## 2017-06-26 MED ORDER — HEPARIN (PORCINE) IN NACL 2-0.9 UNITS/ML
INTRAMUSCULAR | Status: AC | PRN
  Administered 2017-06-26: 500 mL

## 2017-06-26 MED FILL — ROSUVASTATIN CALCIUM 20 MG: 20 | 30 days supply | Qty: 60 | Fill #0

## 2017-06-26 MED FILL — NICOTINE 21 MG/24HR PATCH: 21 | 28 days supply | Qty: 28 | Fill #0

## 2017-06-26 MED FILL — ESCITALOPRAM 10 MG TABLET: 10 | 30 days supply | Qty: 30 | Fill #0

## 2017-06-26 SURGICAL SUPPLY — 9 items
COVER PRB 48X5XTLSCP FOLD TPE (BAG) IMPLANT
COVER PROBE 5X48 (BAG) ×2
KIT MICROPUNCTURE NIT STIFF (SHEATH) ×1 IMPLANT
PROTECTION STATION PRESSURIZED (MISCELLANEOUS) ×2
STATION PROTECTION PRESSURIZED (MISCELLANEOUS) IMPLANT
STOPCOCK MORSE 400PSI 3WAY (MISCELLANEOUS) ×1 IMPLANT
TRAY PV CATH (CUSTOM PROCEDURE TRAY) ×2 IMPLANT
TUBING CIL FLEX 10 FLL-RA (TUBING) ×1 IMPLANT
WIRE TORQFLEX AUST .018X40CM (WIRE) ×2 IMPLANT

## 2017-06-26 NOTE — Progress Notes (Signed)
Family Medicine Teaching Service Daily Progress Note Intern Pager: [phone]  Patient name: Krista Alvarez Medical record number: [id number] Date of birth: Jul 24, 1970 Age: 47 y.o. Gender: female  Primary Care Provider: Loletta Specter, PA-C Consultants: vascular surgery Code Status: full  Pt Overview and Major Events to Date:  6/6 admitted to fpts 6/7 TPA administration to hand  Assessment and Plan: [name] is a 47 y.o. female presenting with 4 day history of increasing pain and 2 day history of progressive blue discoloration of left hand.   Left hand pain and discoloration Less discoloration but pain still present in 3rd, 4th, 5th digits, especially 4th of left hand. Going to OR with [facility] this am for TPA administration and angiography. Will follow up vascular recs in this regard, appreciate their help. Still needs echocardiogram. 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 Likely due to moderate to severe luminal narrowing of bilateral common iliacs. Palpable PT/DP bilaterally and triphasic signals in BLE. Continue heprarin gtt. Post-operative plan for heparin and perhaps intervention per vascular. - heparin gtt as above - appreciate vascular surgery recs  Coronary artery disease  H/O STEMI  Stent Placement Patient with STEMI in 07/2016 s/p stent placement. Had 99% occlusion of RCA. EF rebounded to 5-55% after procedure from 35-45%. Patient has been taking aspirin daily but ran out of brillinta after only one month. Has not been taking this medication for last 10 months. A1C normal, lipid panel with elevated TGs. Given extensive cardiac history will need to be on statin prior to dc. Will start crestor when no longer  NPO. - holding asa and brilinta for now - cm to help with affordability of meds prior to dc - nitrostat prn for chest pain - crestor prior to discharge  Hypertension Patient with hypertension on arrival up to 175/92. Well controlled since admission. Would likely benefit from CCB. Can plan to start norvasc when no longer NPO. Beta blocker with relative contraindication given vascular disease. B-blocker contraindicated due to low heart rate. - metorprolol contraindicated - prn hydralazine for sbp > 160 - norvasc when tolerating po  COPD History of COPD. Says that she had a spirograph done by a pulmonologist at some point but cannot remember where. No records of this in our system or care everywhere. She says that she was told she had copd. Notes dating back to 2013 mention this but unclear if actual tests were transferred to emr. Prescribed combivent inhaler but ran out and has not been taking. Says that breathing treatments do help her sometimes. Scattered wheezing on exam. Will restart combivent while admitted. Albuterol prn for wheezing. CM to assist with medication cost.  Type II diabetes Carries this diagnosis in her chart. Peak a1c 8.0 in 08/2013. Most recent a1c 5.3 in 07/2016. A1C on admission 5.5. Glucose well controlled since admission,  Hyperlipidemia Patient carries this diagnosis in her chart. Last lipid panel in 07/2016 with elevated TGs up to 304. Recheck on admission with TGs up to 222. Start crestor when tolerating po - crestor prior to [employer]  Tobacco dependence Likely the patient's biggest obstacle in the long term. Has a 30 pack year smoking history. Does not want nicotine patch at this time. Smoking 1/2 PPD recently. Can start 14mg  patch if needed for cravings.  Anxiety/Depression Was trialed on lexapro for this. Patinet has not taken in several months. Hasn't felt she has had any issues with this in a long time.  FEN/GI: npo PPx: heparin gtt  Disposition: likely  home  Subjective:  Doing ok this morning. Still with finger pain of left hand.  Objective: Temp:  [97.4 F (36.3 C)-98.7 F (37.1 C)] 97.9 F (36.6 C) (06/07 0751) Pulse Rate:  [60-73] 60 (06/07 0751) Resp:  [12-21] 18 (06/07 0751) BP: (123-146)/(75-88) 146/88 (06/07 0751) SpO2:  [98 %-100 %] 98 % (06/07 0751) Weight:  [160 lb 4.4 oz (72.7 kg)] 160 lb 4.4 oz (72.7 kg) (06/07 0300) Physical Exam: General: alert, awake, caucasian female in no acute distress, resting in bed Cardiovascular: rrr, no m/r/g. Palpable pt/dp bilaterally. Palpable radial and ulnar pulse bilaterally. Respiratory: improved lung sounds, no wheezing appreciated. No increased work of breathing. Gastrointestinal: soft, non-tender, non-distended. MSK: improving bluish discoloration of 3rd, 4th, 5th digit of left hand. Worst in 4th digit fingertips. Derm: bluish discoloration of left hand as above. Scratch noted again on right arm. Neuro: Sensation intact 3rd, 4th, 5th digit left hand. Psych: alert, oriented x3. Appropriate, pleasant  Laboratory: Recent Labs  Lab 06/24/17 1351 06/25/17 0400 06/26/17 0309  WBC 12.7* 12.6* 9.5  HGB 13.8 12.8 11.5*  HCT 40.6 37.5 34.7*  PLT 184 188 142*   Recent Labs  Lab 06/24/17 1351 06/25/17 0400 06/26/17 0731  NA 134* 137  --   K 3.7 3.4* 4.2  CL 100* 104  --   CO2 22 25  --   BUN 11 10  --   CREATININE 0.61 0.59  --   CALCIUM 9.2 8.6*  --   PROT 6.9 6.1*  --   BILITOT 0.7 0.7  --   ALKPHOS 66 60  --   ALT 19 20  --   AST 15 17  --   GLUCOSE 131* 153*  --     Imaging/Diagnostic Tests: None last 24 hours  Myrene Buddy, MD 06/26/2017, 9:47 AM PGY-1 [facility] Family Medicine FPTS Intern pager: [phone], text pages welcome

## 2017-06-26 NOTE — Telephone Encounter (Signed)
sch appt lvm 07/20/17 4pm f/u MD

## 2017-06-26 NOTE — Progress Notes (Signed)
    Subjective  -   Feels the color in her finger is better but it still hurts   Physical Exam:  Palpable radial and ulnar pulses in left hand       Assessment/Plan:    Discussed proceeding with angiogram today via left arm access for diagnostic information as well  as administration of nitro and TPA.  Discussed details of procedure with patient and she is willing to proceed  Wells Cedric Denison 06/26/2017 7:35 AM --  Vitals:   06/25/17 2300 06/26/17 0300  BP: 137/81 137/83  Pulse: 73 69  Resp: (!) 21 19  Temp: 98.4 F (36.9 C) 98.2 F (36.8 C)  SpO2: 100% 100%    Intake/Output Summary (Last 24 hours) at 06/26/2017 0735 Last data filed at 06/25/2017 2301 Gross per 24 hour  Intake 2375 ml  Output -  Net 2375 ml     Laboratory CBC    Component Value Date/Time   WBC 9.5 06/26/2017 0309   HGB 11.5 (L) 06/26/2017 0309   HGB 13.3 08/27/2016 1056   HCT 34.7 (L) 06/26/2017 0309   HCT 40.2 08/27/2016 1056   PLT 142 (L) 06/26/2017 0309   PLT 189 08/27/2016 1056    BMET    Component Value Date/Time   NA 137 06/25/2017 0400   NA 139 08/27/2016 1056   NA 140 02/08/2014 0505   K 3.4 (L) 06/25/2017 0400   K 3.8 02/08/2014 0505   CL 104 06/25/2017 0400   CL 108 (H) 02/08/2014 0505   CO2 25 06/25/2017 0400   CO2 25 02/08/2014 0505   GLUCOSE 153 (H) 06/25/2017 0400   GLUCOSE 96 02/08/2014 0505   BUN 10 06/25/2017 0400   BUN 10 08/27/2016 1056   BUN 16 02/08/2014 0505   CREATININE 0.59 06/25/2017 0400   CREATININE 0.79 02/08/2014 0505   CALCIUM 8.6 (L) 06/25/2017 0400   CALCIUM 8.6 02/08/2014 0505   GFRNONAA >60 06/25/2017 0400   GFRNONAA >60 02/08/2014 0505   GFRNONAA >60 10/05/2013 1428   GFRAA >60 06/25/2017 0400   GFRAA >60 02/08/2014 0505   GFRAA >60 10/05/2013 1428    COAG Lab Results  Component Value Date   INR 1.12 06/25/2017   INR 0.96 06/24/2017   INR 0.98 07/23/2016   No results found for: PTT  Antibiotics Anti-infectives (From admission,  onward)   None       V. Charlena Cross, M.D. [facility] Office: [phone] Pager:  [phone]

## 2017-06-26 NOTE — Care Management (Addendum)
06-26-17 1649 CM was able to go to the [facility] to get medications for patient- 1 x free fill for Nicotine Patches, Lexapro and Crestor. CM did provide the medications to [facility] Staff RN taking care of patient. Please make sure pt receives medications before she transitions home. No further needs from this Case Manager. Gala Lewandowsky, RN,BSN [phone]

## 2017-06-26 NOTE — Op Note (Signed)
    Patient name: Krista Alvarez MRN: [id number] DOB: November 16, 1970 Sex: female  06/26/2017 Pre-operative Diagnosis: Ischemic left fingers Post-operative diagnosis:  Same Surgeon:  Durene Cal Procedure Performed:  1.  Ultrasound-guided access, left ulnar artery  2.  Left upper extremity runoff  3.  Intra-arterial administration of 10 mg of TPA and 600 mcg of nitroglycerin  4.  Conscious sedation (31 minutes)     Indications: The patient presented with a several day history of painful and bluish discoloration of her left fingers 3 4 and 5.  CT angiogram and ultrasound did not show any filling defects within the axillary brachial ulnar or radial arteries.  She did have bilateral iliac lesions consistent with thromboembolic disease.  She was started on heparin.  She had a mild improvement in the discoloration of her fingers but continued to have persistent pain.  I felt our best option at improving perfusion to her hand was to proceed with angiography and TPA administration.  I did not want to go through the groin as I was concerned about dislodging the clot in her iliac arteries which I have elected not to treat surgically as she is asymptomatic.  Procedure:  The patient was identified in the holding area and taken to room 8.  The patient was then placed supine on the table and prepped and draped in the usual sterile fashion.  A time out was called.  Conscious sedation was administered with the use of IV fentanyl and Versed under continuous physician and nurse monitoring.  Heart rate, blood pressure, and oxygen saturation were continuously monitored.  Ultrasound was used to evaluate the left ulnar artery.  It was patent .  I traced it from the wrist up to the antecubital crease to make sure I was cannulating the ulnar artery since she had a high takeoff of the radial artery.  A digital ultrasound image was acquired.  A micropuncture needle was used to access the left ulnar artery under ultrasound  guidance.  An 018 wire was advanced without resistance and a 3 [place] angiocatheter was inserted.  Contrast injections were performed through the sheath. Findings:   Left upper extremity: A high takeoff of the left radial artery was identified.  No filling defects were observed in the radial or ulnar artery down across the wrist.  There was hypoperfusion to the fourth digit.    Intervention: After the above images were acquired I selected 600 mcg of nitroglycerin followed by 10 mg of TPA and inserted them directly into the catheter in the ulnar artery.  A completion image was then performed which showed improved opacification of perfusion to fingers 3 4 and 5 with distal filling of the digital arteries in the fourth finger.  Impression:  #1 improvement in the opacification of the digital arteries after administration of nitroglycerin and TPA   V. Durene Cal, M.D. [facility] [place] Office: [phone] Pager:  [phone]

## 2017-06-26 NOTE — Progress Notes (Signed)
  Echocardiogram 2D Echocardiogram has been performed.  Krista Alvarez F 06/26/2017, 12:33 PM

## 2017-06-26 NOTE — Care Management (Addendum)
CM checked with [facility] pharmacy - Brilinta not faxed in.  Per attending; pt may discharge this weekend.  Pt is now on IV heparin but will transition to Brilinta prior to discharge.  CM will pick up the following meds from [facility]; Crestor, lexapro and provide to pt today. Combivent has to be ordered and will be available Monday as it had to be ordered (attending aware).  If pt discharges over the weekend - pt will be able to utilize [facility] for Brilinta.

## 2017-06-26 NOTE — Progress Notes (Signed)
Patient's room smells of smoke. AD notified and addressed with pt. Pt. Denies smoking in the bathroom. Will continue to monitor.

## 2017-06-26 NOTE — Progress Notes (Addendum)
S/p left hand angio with intra-arterial administration of TPA and nitro with improvement in angiographic evaluation of digital arteries.  Will restart heparin drip 4 hours after sheath removal. Transition to oral anticoagulation. She will follow up with me in 3-4 weeks.  The patient still has filling defects in bilateral iliac arteries.  She is asymptomatic from these with palpable dorsalis pedis pulses bilaterally.  Therefore I have elected to manage this with anticoagulation.  Krista Alvarez

## 2017-06-26 NOTE — Progress Notes (Signed)
ANTICOAGULATION CONSULT NOTE  Pharmacy Consult for heparin Indication: ischemic finger and common iliac clot  Allergies  Allergen Reactions  . Hydrocodone-Acetaminophen Itching  . Ibuprofen     "interacted with meds" per pt's family     Patient Measurements: Height: 5\' 4"  (162.6 cm) Weight: 160 lb 4.4 oz (72.7 kg) IBW/kg (Calculated) : 54.7  Vital Signs: Temp: 98 F (36.7 C) (06/07 1145) Temp Source: Oral (06/07 1145) BP: 120/75 (06/07 1145) Pulse Rate: 57 (06/07 1145)  Labs: Recent Labs    06/24/17 1351 06/25/17 0400 06/25/17 0652 06/25/17 1257 06/26/17 0309  HGB 13.8 12.8  --   --  11.5*  HCT 40.6 37.5  --   --  34.7*  PLT 184 188  --   --  142*  APTT  --  100*  --   --   --   LABPROT 12.7 14.4  --   --   --   INR 0.96 1.12  --   --   --   HEPARINUNFRC  --   --  0.33 0.31 0.35  CREATININE 0.61 0.59  --   --   --   TROPONINI <0.03  --   --   --   --     Estimated Creatinine Clearance: 86.8 mL/min (by C-G formula based on SCr of 0.59 mg/dL).   Medical History: Past Medical History:  Diagnosis Date  . Anxiety   . Arthritis    "hips" (08/30/2014)  . Asthma   . Chronic lower back pain   . COPD (chronic obstructive pulmonary disease) (HCC)   . Coronary artery disease    07/23/16-STEMI PCI -->DES to RCA, Ef normal   . Depression   . GERD (gastroesophageal reflux disease)   . Hypercholesterolemia   . Hypertension   . Migraine    "a few times/month" (08/30/2014)  . NSTEMI (non-ST elevated myocardial infarction) (HCC) 08/2013   [name] 09/17/2013  . Pneumonia "several times"  . Type II diabetes mellitus (HCC)     Assessment: 47yo female c/o left hand pain w/ discoloration to three left fingers and blue fingertips, CT reveals irregular clot within right common iliac artery, admitted for further surgical/vascular evaluation on heparin.   S/p upper extremity angio this am, orders to restart heparin this afternoon post angio. Heparin at low end of goal, will  restart at previous rate.   Goal of Therapy:  Heparin level 0.3-0.7 units/ml Monitor platelets by anticoagulation protocol: Yes   Plan:  Restart heparin at 1250 units/hr>>1500 today Recheck labs in am  Sheppard Coil PharmD., BCPS Clinical Pharmacist 06/26/2017 11:54 AM

## 2017-06-27 DIAGNOSIS — I2510 Atherosclerotic heart disease of native coronary artery without angina pectoris: Secondary | ICD-10-CM | POA: Diagnosis not present

## 2017-06-27 DIAGNOSIS — I10 Essential (primary) hypertension: Secondary | ICD-10-CM | POA: Diagnosis not present

## 2017-06-27 DIAGNOSIS — I998 Other disorder of circulatory system: Secondary | ICD-10-CM | POA: Diagnosis not present

## 2017-06-27 DIAGNOSIS — I70203 Unspecified atherosclerosis of native arteries of extremities, bilateral legs: Secondary | ICD-10-CM | POA: Diagnosis not present

## 2017-06-27 LAB — GLUCOSE, CAPILLARY
GLUCOSE-CAPILLARY: 113 mg/dL — AB (ref 65–99)
GLUCOSE-CAPILLARY: 128 mg/dL — AB (ref 65–99)
Glucose-Capillary: 123 mg/dL — ABNORMAL HIGH (ref 65–99)

## 2017-06-27 LAB — CBC WITH DIFFERENTIAL/PLATELET
ABS IMMATURE GRANULOCYTES: 0.2 10*3/uL — AB (ref 0.0–0.1)
BASOS ABS: 0.1 10*3/uL (ref 0.0–0.1)
Basophils Relative: 1 %
Eosinophils Absolute: 0.3 10*3/uL (ref 0.0–0.7)
Eosinophils Relative: 3 %
HCT: 35.1 % — ABNORMAL LOW (ref 36.0–46.0)
HEMOGLOBIN: 11.8 g/dL — AB (ref 12.0–15.0)
IMMATURE GRANULOCYTES: 2 %
LYMPHS PCT: 36 %
Lymphs Abs: 3.4 10*3/uL (ref 0.7–4.0)
MCH: 32 pg (ref 26.0–34.0)
MCHC: 33.6 g/dL (ref 30.0–36.0)
MCV: 95.1 fL (ref 78.0–100.0)
Monocytes Absolute: 1 10*3/uL (ref 0.1–1.0)
Monocytes Relative: 10 %
NEUTROS ABS: 4.6 10*3/uL (ref 1.7–7.7)
NEUTROS PCT: 48 %
PLATELETS: 146 10*3/uL — AB (ref 150–400)
RBC: 3.69 MIL/uL — AB (ref 3.87–5.11)
RDW: 14.9 % (ref 11.5–15.5)
WBC: 9.4 10*3/uL (ref 4.0–10.5)

## 2017-06-27 LAB — HEPARIN LEVEL (UNFRACTIONATED): HEPARIN UNFRACTIONATED: 0.26 [IU]/mL — AB (ref 0.30–0.70)

## 2017-06-27 LAB — BASIC METABOLIC PANEL
Anion gap: 9 (ref 5–15)
BUN: 10 mg/dL (ref 6–20)
CHLORIDE: 106 mmol/L (ref 101–111)
CO2: 26 mmol/L (ref 22–32)
Calcium: 9 mg/dL (ref 8.9–10.3)
Creatinine, Ser: 0.57 mg/dL (ref 0.44–1.00)
GFR calc Af Amer: >60 mL/min (ref >60)
GFR calc non Af Amer: >60 mL/min (ref >60)
Glucose, Bld: 117 mg/dL — ABNORMAL HIGH (ref 65–99)
POTASSIUM: 4 mmol/L (ref 3.5–5.1)
SODIUM: 141 mmol/L (ref 135–145)

## 2017-06-27 MED ORDER — APIXABAN 5 MG PO TABS: 10.0000 mg | ORAL_TABLET | Freq: Two times a day (BID) | ORAL | 0 refills | Status: DC

## 2017-06-27 MED ORDER — TICAGRELOR 90 MG PO TABS
90.0000 mg | ORAL_TABLET | Freq: Two times a day (BID) | ORAL | Status: DC
  Administered 2017-06-27: 90 mg via ORAL
  Filled 2017-06-27: qty 4
  Filled 2017-06-27: qty 1

## 2017-06-27 MED ORDER — TICAGRELOR 90 MG PO TABS: 90.0000 mg | ORAL_TABLET | Freq: Two times a day (BID) | ORAL | 0 refills | Status: DC

## 2017-06-27 MED ORDER — HEPARIN (PORCINE) IN NACL 100-0.45 UNIT/ML-% IJ SOLN: 1400.0000 [IU]/h | INTRAMUSCULAR | Status: DC

## 2017-06-27 MED ORDER — OXYCODONE-ACETAMINOPHEN 5-325 MG PO TABS: 1.0000 | ORAL_TABLET | ORAL | 0 refills | Status: DC | PRN

## 2017-06-27 MED ORDER — ROSUVASTATIN CALCIUM 40 MG PO TABS: 40.0000 mg | ORAL_TABLET | Freq: Every day | ORAL | 0 refills | Status: DC

## 2017-06-27 MED ORDER — APIXABAN 5 MG PO TABS
10.0000 mg | ORAL_TABLET | Freq: Two times a day (BID) | ORAL | Status: DC
  Administered 2017-06-27: 10 mg via ORAL
  Filled 2017-06-27: qty 2

## 2017-06-27 MED ORDER — APIXABAN 5 MG PO TABS: 5.0000 mg | ORAL_TABLET | Freq: Two times a day (BID) | ORAL | Status: DC

## 2017-06-27 NOTE — Progress Notes (Addendum)
  Progress Note    06/27/2017 11:12 AM 1 Day Post-Op  Subjective:  Per patient the color, grip strength, and pain in L hand improved since angiography with tpa   Vitals:   06/27/17 0304 06/27/17 0805  BP: 123/89 131/79  Pulse: 72 73  Resp: 16 17  Temp: 97.8 F (36.6 C) 97.8 F (36.6 C)  SpO2: 100% 100%   Physical Exam: Cardiac:  RRR Incisions:  L ulnar artery cath site without hematoma or drainage; irritation to skin from tape Extremities:  Palpable L radial and ulnar; some discoloration L 4th finger Neurologic: A&O  CBC    Component Value Date/Time   WBC 9.4 06/27/2017 0259   RBC 3.69 (L) 06/27/2017 0259   HGB 11.8 (L) 06/27/2017 0259   HGB 13.3 08/27/2016 1056   HCT 35.1 (L) 06/27/2017 0259   HCT 40.2 08/27/2016 1056   PLT 146 (L) 06/27/2017 0259   PLT 189 08/27/2016 1056   MCV 95.1 06/27/2017 0259   MCV 97 08/27/2016 1056   MCV 97 02/08/2014 0505   MCH 32.0 06/27/2017 0259   MCHC 33.6 06/27/2017 0259   RDW 14.9 06/27/2017 0259   RDW 15.0 08/27/2016 1056   RDW 13.7 02/08/2014 0505   LYMPHSABS 3.4 06/27/2017 0259   LYMPHSABS 2.4 08/27/2016 1056   LYMPHSABS 2.6 02/08/2014 0505   MONOABS 1.0 06/27/2017 0259   MONOABS 0.6 02/08/2014 0505   EOSABS 0.3 06/27/2017 0259   EOSABS 0.3 08/27/2016 1056   EOSABS 0.3 02/08/2014 0505   BASOSABS 0.1 06/27/2017 0259   BASOSABS 0.0 08/27/2016 1056   BASOSABS 0.0 02/08/2014 0505    BMET    Component Value Date/Time   NA 141 06/27/2017 0259   NA 139 08/27/2016 1056   NA 140 02/08/2014 0505   K 4.0 06/27/2017 0259   K 3.8 02/08/2014 0505   CL 106 06/27/2017 0259   CL 108 (H) 02/08/2014 0505   CO2 26 06/27/2017 0259   CO2 25 02/08/2014 0505   GLUCOSE 117 (H) 06/27/2017 0259   GLUCOSE 96 02/08/2014 0505   BUN 10 06/27/2017 0259   BUN 10 08/27/2016 1056   BUN 16 02/08/2014 0505   CREATININE 0.57 06/27/2017 0259   CREATININE 0.79 02/08/2014 0505   CALCIUM 9.0 06/27/2017 0259   CALCIUM 8.6 02/08/2014 0505   GFRNONAA >60 06/27/2017 0259   GFRNONAA >60 02/08/2014 0505   GFRNONAA >60 10/05/2013 1428   GFRAA >60 06/27/2017 0259   GFRAA >60 02/08/2014 0505   GFRAA >60 10/05/2013 1428    INR    Component Value Date/Time   INR 1.12 06/25/2017 0400   INR 1.0 02/07/2014 1142     Intake/Output Summary (Last 24 hours) at 06/27/2017 1112 Last data filed at 06/27/2017 0800 Gross per 24 hour  Intake 1015.1 ml  Output 775 ml  Net 240.1 ml     Assessment/Plan:  47 y.o. female is s/p L hand angiography with TPA and nitro 1 Day Post-Op   Cath site L ulnar artery unremarkable Improvement in symptoms since procedure; maintains a palpable radial and ulnar pulse Transition from IV heparin to oral anticoagulation Follow up with Dr. Myra Gianotti in 3-4 weeks; office will call to arrange this appt   Emilie Rutter, PA-C [facility] [phone] 06/27/2017 11:12 AM  Agree with above.  Pt starting Eliquis today.  Fabienne Bruns, MD [facility] [place] Office: [phone] Pager: [phone]

## 2017-06-27 NOTE — Progress Notes (Signed)
ANTICOAGULATION CONSULT NOTE  Pharmacy Consult for heparin Indication: ischemic finger and common iliac clot  Allergies  Allergen Reactions  . Hydrocodone-Acetaminophen Itching  . Ibuprofen     "interacted with meds" per pt's family     Patient Measurements: Height: 5\' 4"  (162.6 cm) Weight: 160 lb 4.4 oz (72.7 kg) IBW/kg (Calculated) : 54.7  Vital Signs: Temp: 97.8 F (36.6 C) (06/08 0805) Temp Source: Oral (06/08 0805) BP: 131/79 (06/08 0805) Pulse Rate: 73 (06/08 0805)  Labs: Recent Labs    06/24/17 1351 06/25/17 0400  06/25/17 1257 06/26/17 0309 06/27/17 0259  HGB 13.8 12.8  --   --  11.5* 11.8*  HCT 40.6 37.5  --   --  34.7* 35.1*  PLT 184 188  --   --  142* 146*  APTT  --  100*  --   --   --   --   LABPROT 12.7 14.4  --   --   --   --   INR 0.96 1.12  --   --   --   --   HEPARINUNFRC  --   --    < > 0.31 0.35 0.26*  CREATININE 0.61 0.59  --   --   --  0.57  TROPONINI <0.03  --   --   --   --   --    < > = values in this interval not displayed.    Estimated Creatinine Clearance: 86.8 mL/min (by C-G formula based on SCr of 0.57 mg/dL).   Medical History: Past Medical History:  Diagnosis Date  . Anxiety   . Arthritis    "hips" (08/30/2014)  . Asthma   . Chronic lower back pain   . COPD (chronic obstructive pulmonary disease) (HCC)   . Coronary artery disease    07/23/16-STEMI PCI -->DES to RCA, Ef normal   . Depression   . GERD (gastroesophageal reflux disease)   . Hypercholesterolemia   . Hypertension   . Migraine    "a few times/month" (08/30/2014)  . NSTEMI (non-ST elevated myocardial infarction) (HCC) 08/2013   [name] 09/17/2013  . Pneumonia "several times"  . Type II diabetes mellitus (HCC)     Assessment: 47yo female c/o left hand pain w/ discoloration to three left fingers and blue fingertips, CT reveals irregular clot within right common iliac artery, admitted for further surgical/vascular evaluation on heparin.   S/p upper extremity angio  this am, orders to restart heparin this afternoon post angio. Heparin at low end of goal, will restart at previous rate.   Goal of Therapy:  Heparin level 0.3-0.7 units/ml Monitor platelets by anticoagulation protocol: Yes   Plan:  Increase IV heparin to 1400 units/hr. Recheck heparin level in 6 hrs. Daily heparin level and CBC. F/u plans for anticoagulation and Brilinta.  Reece Leader, Colon Flattery, [facility] Clinical Pharmacist Pager [phone]  06/27/2017 10:41 AM

## 2017-06-27 NOTE — Care Management (Addendum)
Update: Pt will discharge home today.  CM confirmed with attending service that pt will need both Eliquis and Brilinta.  Pt will  receive enough samples prior to discharge for coverage until  Monday of Brilinta - pt will need to go to [facility] on Monday as instructed to receive remainder. CM provided free 30 day card for Eliquis.  Pt instructed to call Barkley Bruns at [facility] on Monday am and inform that she also needs medication assistance for Eliquis - pt will have to complete pt assistance for eliquis, brilinta and Combivent  medications to continue to receive.  CM informed both attending service and vascular team of barriers with current treatment regimen  - vascular will follow up with pt at next appt.  [facility] appt already set up for post hospital follow up.  Pt states she has lopressor at home - CM also informed pt that is on the generic list at [facility].  Per attending; all other PTA medications can be managed/prescribed by Primary Care Doctor.    Bedside nurse will give all medications obtained from [facility] pharmacy to pt prior to discharge -  Bedside nurse reviewed medications per AVS (nicotine was retrieved from [facility] pharm but has now been discontinued)  CM and bedside nurse reviewed all information with pt and reiterated the importance of going to [facility] on Monday,  applying for medication assistance and picking up remainder of Brilinta needed for the month and Combivent.  CM also reiterated the use of the Free 30 day eliquis card

## 2017-06-27 NOTE — Discharge Summary (Signed)
[facility] Discharge Summary  Patient name: Krista Alvarez Medical record number: [id number] Date of birth: Oct 06, 1970 Age: 47 y.o. 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 Palpable pt/dp bilaterally. Palpable radial and ulnar pulse bilaterally. Respiratory:improved lung sounds, no wheezing appreciated. No increased work of breathing. Gastrointestinal:soft, non-tender, non-distended. WUJ:WJXBJYNWG bluish discoloration of 3rd, 4th, 5th digit of left hand. Improved but still present in in 4th digit fingertips. Derm:bluish discoloration of left hand as above. Scratch noted again on right arm. Neuro:Sensation intact 3rd, 4th, 5th digit left hand. Psych:alert, oriented x3. Appropriate, pleasant  Brief Hospital Course:  47 year old female who presented on 6/6 due to multiple days of increasing pain and blue discoloration of 3rd, 4th, 5th digits. Patient with CTA LUE which showed not areas of stenosis among the radial or ulnar artery. LUE arterial duplex showed decreased fingertip pressure in 4th digit. CT angio chest/abdomen/pelvis was significant for stenosis 2/2 unstable clot formation in bilateral common iliac arteries.  Hand Ischemia  Initially started on heparin gtt per vascular surgery recommendations. Discoloration improved over  course of 6/6 but the pain did not resolve. Patient was taken by vascular surgery and had LUE angiography and tpa administration performed. This helped resolve patient's pain and discoloration. On 6/8 she was transitioned from heparin drip to eliquis. She was started on treatment dose 10mg  bid for first week and 5mg  bid afterwards. She is to follow up with vascular surgery in 3-4 weeks.  Bilateral common iliac blood clots Started on heparin gtt and treatment as above. To follow up with vascular surgery regarding possible surgical options vs conservative treatment. Palpable pt/dp in both extremities so vascular opted to work this up as an outpatient.  CAD h/o stent Patient not taking brillinta sicne she ran out of 30 day supply. Case management assisted in getting new 30 days supply. Will need close follow up with pcp to ensure still able to get her medications. Patient started on crestor while admitted due to coronary history.  Issues for Follow Up:  1. Ensure patient follows up with vascular surgery 2. Ensure patient able to get both brillinta and eliquis 3. Check left hand to ensure discoloration and pain gone  Significant Procedures:   Significant Labs and Imaging:  Recent Labs  Lab 06/25/17 0400 06/26/17 0309 06/27/17 0259  WBC 12.6* 9.5 9.4  HGB 12.8 11.5* 11.8*  HCT 37.5 34.7* 35.1*  PLT 188 142* 146*   Recent Labs  Lab 06/24/17 1351 06/25/17 0400 06/26/17 0731 06/27/17 0259  NA 134* 137  --  141  K 3.7 3.4* 4.2 4.0  CL 100* 104  --  106  CO2 22 25  --  26  GLUCOSE 131* 153*  --  117*  BUN 11 10  --  10  CREATININE 0.61 0.59  --  0.57  CALCIUM 9.2 8.6*  --  9.0  ALKPHOS 66 60  --   --   AST 15 17  --   --   ALT  19 20  --   --   ALBUMIN 4.1 3.5  --   --     Results/Tests Pending at Time of Discharge:   Discharge Medications:  Allergies as of 06/27/2017      Reactions   Hydrocodone-acetaminophen Itching   Ibuprofen    "interacted with meds" per pt's family       Medication List    STOP taking these medications   acetaminophen 325 MG tablet Commonly known as:  TYLENOL   diphenhydrAMINE 25 mg capsule Commonly known as:  BENADRYL   nicotine 21 mg/24hr patch Commonly known as:  NICODERM CQ - dosed in mg/24 hours     TAKE these medications   apixaban 5 MG Tabs tablet Commonly known as:  ELIQUIS Take 2 tablets (10 mg total) by mouth 2 (two) times daily.   aspirin EC 81 MG tablet Take 1 tablet (81 mg total) by mouth daily.   escitalopram 10 MG tablet Commonly known as:  LEXAPRO Take 1 tablet (10 mg total) by mouth daily.   Ipratropium-Albuterol 20-100 MCG/ACT Aers respimat Commonly known as:  COMBIVENT RESPIMAT Inhale 1 puff into the lungs 4 (four) times daily.   metoprolol tartrate 25 MG tablet Commonly known as:  LOPRESSOR Take 0.5 tablets (12.5 mg total) by mouth 2 (two) times daily.   omega-3 acid ethyl esters 1 g capsule Commonly known as:  LOVAZA Take 1 capsule (1 g total) by mouth 2 (two) times daily.   oxyCODONE-acetaminophen 5-325 MG tablet Commonly known as:  PERCOCET Take 1 tablet by mouth every 4 (four) hours as needed for severe pain.   rosuvastatin 40 MG tablet Commonly known as:  CRESTOR Take 1 tablet (40 mg total) by mouth daily at 6 PM.   ticagrelor 90 MG Tabs tablet Commonly known as:  BRILINTA Take 1 tablet (90 mg total) by mouth 2 (two) times daily.       Discharge Instructions: Please refer to Patient Instructions section of EMR for full details.  Patient was counseled important signs and symptoms that should prompt return to medical care, changes in medications, dietary instructions, activity restrictions, and follow up appointments.   Follow-Up Appointments: Follow-up Information    [facility]. Go on 07/20/2017.   Why:  at 2:30pm for primary care doctor appt Contact information: [address] [place] [address] [phone]        [facility] Follow up.   Why:  Please go to pharmacy at discharge, request to speak with Barkley Bruns and provide prescriptions including Brilinta and Albuterol - when you fill out assistance application  your copay for both of these medications will be $10 a piece. Others will be $4-$10 Contact information: [address] [place] [address] [phone]       [facility] Follow up.   Contact information: Please call [phone] 6/10 and request financial advisor appt.  This appt will assit you in filling out application for Blue Card (medication copay assistance) and orange card (clinic copay assistance).          Myrene Buddy, MD 06/27/2017, 12:39 PM PGY-1, [facility]

## 2017-06-27 NOTE — Discharge Instructions (Signed)
You are being discharged on a medication called eliquis for your blood clots. You will take 10mg  two times per day for the first 7 days. You will then switch to 5mg  two times per day until you see vascular surgery in follow up. They will call you to schedule this follow up appointment. We started an anticholesterol medication called crestor while you were here. Please take 20mg  daily.  I think it would also be a good idea to schedule follow up with your pcp for the middle of next week. They can check on your hand and make sure everything is still going well.  Information on my medicine - ELIQUIS (apixaban)  This medication education was reviewed with me or my healthcare representative as part of my discharge preparation.  The pharmacist that spoke with me during my hospital stay was:  Gardner Candle, [facility]  Why was Eliquis prescribed for you? Eliquis was prescribed to treat blood clots that may have been found in the veins of your legs (deep vein thrombosis) or in your lungs (pulmonary embolism) and to reduce the risk of them occurring again.  What do You need to know about Eliquis ? The starting dose is 10 mg (two 5 mg tablets) taken TWICE daily for the FIRST SEVEN (7) DAYS, then on (enter date)  07/04/17  the dose is reduced to ONE 5 mg tablet taken TWICE daily.  Eliquis may be taken with or without food.   Try to take the dose about the same time in the morning and in the evening. If you have difficulty swallowing the tablet whole please discuss with your pharmacist how to take the medication safely.  Take Eliquis exactly as prescribed and DO NOT stop taking Eliquis without talking to the doctor who prescribed the medication.  Stopping may increase your risk of developing a new blood clot.  Refill your prescription before you run out.  After discharge, you should have regular check-up appointments with your healthcare provider that is prescribing your Eliquis.    What do you do if you  miss a dose? If a dose of ELIQUIS is not taken at the scheduled time, take it as soon as possible on the same day and twice-daily administration should be resumed. The dose should not be doubled to make up for a missed dose.  Important Safety Information A possible side effect of Eliquis is bleeding. You should call your healthcare provider right away if you experience any of the following: ? Bleeding from an injury or your nose that does not stop. ? Unusual colored urine (red or dark brown) or unusual colored stools (red or black). ? Unusual bruising for unknown reasons. ? A serious fall or if you hit your head (even if there is no bleeding).  Some medicines may interact with Eliquis and might increase your risk of bleeding or clotting while on Eliquis. To help avoid this, consult your healthcare provider or pharmacist prior to using any new prescription or non-prescription medications, including herbals, vitamins, non-steroidal anti-inflammatory drugs (NSAIDs) and supplements.  This website has more information on Eliquis (apixaban): http://www.eliquis.com/eliquis/home

## 2017-06-29 ENCOUNTER — Telehealth: Payer: Self-pay | Admitting: Surgery

## 2017-06-29 ENCOUNTER — Other Ambulatory Visit: Payer: Self-pay

## 2017-06-29 DIAGNOSIS — I998 Other disorder of circulatory system: Secondary | ICD-10-CM

## 2017-06-29 NOTE — Telephone Encounter (Signed)
sch appt spk to pt husband 07/20/17 330pm lab 4pm f/u MD

## 2017-07-07 MED FILL — !ELIQUIS 5 MG TABLET: 5 | 30 days supply | Qty: 75 | Fill #0

## 2017-07-07 MED FILL — COMBIVENT RESPIMAT INHAL SP: 20-100 | 30 days supply | Qty: 4 | Fill #0

## 2017-07-20 ENCOUNTER — Ambulatory Visit: Payer: Self-pay | Admitting: Surgery

## 2017-07-20 ENCOUNTER — Ambulatory Visit (INDEPENDENT_AMBULATORY_CARE_PROVIDER_SITE_OTHER): Payer: Self-pay | Admitting: Physician Assistant

## 2017-07-20 ENCOUNTER — Inpatient Hospital Stay (HOSPITAL_COMMUNITY): Admission: RE | Admit: 2017-07-20 | Payer: Self-pay | Source: Ambulatory Visit

## 2017-08-24 ENCOUNTER — Ambulatory Visit (HOSPITAL_COMMUNITY): Admission: RE | Admit: 2017-08-24 | Payer: Self-pay | Source: Ambulatory Visit

## 2017-08-24 ENCOUNTER — Ambulatory Visit: Payer: Self-pay | Admitting: Surgery

## 2017-09-28 ENCOUNTER — Emergency Department: Payer: Self-pay

## 2017-09-28 ENCOUNTER — Encounter: Payer: Self-pay | Admitting: Emergency Medicine

## 2017-09-28 ENCOUNTER — Emergency Department
Admission: EM | Admit: 2017-09-28 | Discharge: 2017-09-28 | Disposition: A | Discharge status: Home / Self Care | Payer: Self-pay | Attending: Emergency Medicine | Admitting: Emergency Medicine

## 2017-09-28 ENCOUNTER — Other Ambulatory Visit: Payer: Self-pay

## 2017-09-28 DIAGNOSIS — E119 Type 2 diabetes mellitus without complications: Secondary | ICD-10-CM | POA: Insufficient documentation

## 2017-09-28 DIAGNOSIS — R110 Nausea: Secondary | ICD-10-CM | POA: Insufficient documentation

## 2017-09-28 DIAGNOSIS — J449 Chronic obstructive pulmonary disease, unspecified: Secondary | ICD-10-CM | POA: Insufficient documentation

## 2017-09-28 DIAGNOSIS — I2510 Atherosclerotic heart disease of native coronary artery without angina pectoris: Secondary | ICD-10-CM | POA: Insufficient documentation

## 2017-09-28 DIAGNOSIS — R079 Chest pain, unspecified: Secondary | ICD-10-CM | POA: Insufficient documentation

## 2017-09-28 DIAGNOSIS — I252 Old myocardial infarction: Secondary | ICD-10-CM | POA: Insufficient documentation

## 2017-09-28 DIAGNOSIS — Z7901 Long term (current) use of anticoagulants: Secondary | ICD-10-CM | POA: Insufficient documentation

## 2017-09-28 DIAGNOSIS — Z7982 Long term (current) use of aspirin: Secondary | ICD-10-CM | POA: Insufficient documentation

## 2017-09-28 DIAGNOSIS — F17210 Nicotine dependence, cigarettes, uncomplicated: Secondary | ICD-10-CM | POA: Insufficient documentation

## 2017-09-28 HISTORY — DX: Heart failure, unspecified: I50.9

## 2017-09-28 HISTORY — DX: Acute embolism and thrombosis of unspecified deep veins of unspecified lower extremity: I82.409

## 2017-09-28 LAB — BASIC METABOLIC PANEL
Anion gap: 5 (ref 5–15)
BUN: 9 mg/dL (ref 6–20)
CHLORIDE: 114 mmol/L — AB (ref 98–111)
CO2: 20 mmol/L — ABNORMAL LOW (ref 22–32)
Calcium: 7 mg/dL — ABNORMAL LOW (ref 8.9–10.3)
Creatinine, Ser: 0.41 mg/dL — ABNORMAL LOW (ref 0.44–1.00)
GFR calc Af Amer: >60 mL/min (ref >60)
GLUCOSE: 133 mg/dL — AB (ref 70–99)
POTASSIUM: 3.3 mmol/L — AB (ref 3.5–5.1)
Sodium: 139 mmol/L (ref 135–145)

## 2017-09-28 LAB — CBC
HCT: 36.7 % (ref 35.0–47.0)
Hemoglobin: 13.1 g/dL (ref 12.0–16.0)
MCH: 34 pg (ref 26.0–34.0)
MCHC: 35.6 g/dL (ref 32.0–36.0)
MCV: 95.5 fL (ref 80.0–100.0)
PLATELETS: 180 10*3/uL (ref 150–440)
RBC: 3.84 MIL/uL (ref 3.80–5.20)
RDW: 15.8 % — AB (ref 11.5–14.5)
WBC: 8.1 10*3/uL (ref 3.6–11.0)

## 2017-09-28 LAB — TROPONIN I: Troponin I: <0.03 ng/mL (ref <0.03)

## 2017-09-28 MED ORDER — FENTANYL CITRATE (PF) 100 MCG/2ML IJ SOLN
100.0000 ug | Freq: Once | INTRAMUSCULAR | Status: AC
  Administered 2017-09-28: 100 ug via INTRAVENOUS
  Filled 2017-09-28: qty 2

## 2017-09-28 MED ORDER — IOHEXOL 350 MG/ML SOLN
75.0000 mL | Freq: Once | INTRAVENOUS | Status: AC | PRN
  Administered 2017-09-28: 75 mL via INTRAVENOUS

## 2017-09-28 MED ORDER — OXYCODONE-ACETAMINOPHEN 5-325 MG PO TABS
2.0000 | ORAL_TABLET | Freq: Once | ORAL | Status: AC
  Administered 2017-09-28: 2 via ORAL
  Filled 2017-09-28: qty 2

## 2017-09-28 MED ORDER — OXYCODONE-ACETAMINOPHEN 5-325 MG PO TABS: 1.0000 | ORAL_TABLET | ORAL | 0 refills | Status: DC | PRN

## 2017-09-28 MED ORDER — ONDANSETRON HCL 4 MG/2ML IJ SOLN
4.0000 mg | Freq: Once | INTRAMUSCULAR | Status: AC
  Administered 2017-09-28: 4 mg via INTRAVENOUS
  Filled 2017-09-28: qty 2

## 2017-09-28 NOTE — ED Provider Notes (Signed)
[facility] Emergency Department Provider Note  Time seen: 8:27 AM  I have reviewed the triage vital signs and the nursing notes.   HISTORY  Chief Complaint Back Pain and Chest Pain    HPI Tomika Eckles is a 47 y.o. female with a past medical history of anxiety, COPD, DVT on Eliquis, hypertension, hyperlipidemia, and STEMI status post 1 stent who presents to the emergency department for left back pain.  According to the patient approximately 1 to 2 hours ago she developed left back pain.  States the pain would not go away and is now severe.  Somewhat worse with deep inspiration.  States she has been very nauseated as well.  Denies any significant trouble breathing.  States she did become diaphoretic earlier today but not currently.  Patient states with her past heart attack she is always had back pain is never had anterior chest pain.  Denies any strenuous activity.  Largely negative review of systems otherwise.   Past Medical History:  Diagnosis Date  . Anxiety   . Arthritis    "hips" (08/30/2014)  . Asthma   . Chronic lower back pain   . COPD (chronic obstructive pulmonary disease) (HCC)   . Coronary artery disease    07/23/16-STEMI PCI -->DES to RCA, Ef normal   . Depression   . DVT (deep venous thrombosis) (HCC)   . GERD (gastroesophageal reflux disease)   . Hypercholesterolemia   . Hypertension   . Migraine    "a few times/month" (08/30/2014)  . NSTEMI (non-ST elevated myocardial infarction) (HCC) 08/2013   [name] 09/17/2013  . Pneumonia "several times"  . Type II diabetes mellitus [facility])     Patient Active Problem List   Diagnosis Date Noted  . Ischemia of digits of hand 06/25/2017  . 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 Hyperlipidemia 07/25/2016  . Hypertension 07/25/2016  . STEMI involving right coronary artery (HCC) 07/23/2016  . Acute inferoposterior myocardial infarction (HCC) 07/23/2016  . CAD (coronary artery  disease)   . Type 2 diabetes mellitus without complication (HCC)   . Tobacco abuse   . COLD (chronic obstructive lung disease) (HCC)   . Pain in the chest   . Chest pain 08/30/2014  . TOBACCO DEPENDENCE 03/19/2006  . PARESTHESIA NOS 03/19/2006    Past Surgical History:  Procedure Laterality Date  . CARDIAC CATHETERIZATION    . CARDIAC CATHETERIZATION N/A 08/31/2014   Procedure: Left Heart Cath and Coronary Angiography;  Surgeon: Kathleene Hazel, MD;  Location: [facility] INVASIVE CV LAB;  Service: Cardiovascular;  Laterality: N/A;  . CORONARY ANGIOPLASTY    . CORONARY/GRAFT ACUTE MI REVASCULARIZATION N/A 07/23/2016   Procedure: Coronary/Graft Acute MI Revascularization;  Surgeon: Corky Crafts, MD;  Location: [facility] INVASIVE CV LAB;  Service: Cardiovascular;  Laterality: N/A;  . LEFT HEART CATH AND CORONARY ANGIOGRAPHY N/A 07/23/2016   Procedure: Left Heart Cath and Coronary Angiography;  Surgeon: Corky Crafts, MD;  Location: [facility] INVASIVE CV LAB;  Service: Cardiovascular;  Laterality: N/A;  . PERCUTANEOUS PINNING PHALANX FRACTURE OF HAND Left    "plate & pins"  . TONSILLECTOMY    . UPPER EXTREMITY ANGIOGRAPHY Left 06/26/2017   Procedure: UPPER EXTREMITY ANGIOGRAPHY;  Surgeon: Nada Libman, MD;  Location: [facility] INVASIVE CV LAB;  Service: Cardiovascular;  Laterality: Left;  TPA administration single bolus    Prior to Admission medications   Medication Sig Start Date End Date Taking? Authorizing Provider  apixaban (ELIQUIS) 5  MG TABS tablet Take 2 tablets (10 mg total) by mouth 2 (two) times daily. 06/27/17   Myrene Buddy, MD  aspirin EC 81 MG tablet Take 1 tablet (81 mg total) by mouth daily. 06/25/17   Lockamy, Marcial Pacas, DO  escitalopram (LEXAPRO) 10 MG tablet Take 1 tablet (10 mg total) by mouth daily. 06/25/17   Lockamy, Marcial Pacas, DO  Ipratropium-Albuterol (COMBIVENT RESPIMAT) 20-100 MCG/ACT AERS respimat Inhale 1 puff into the lungs 4 (four) times daily. 06/25/17   Arlyce Harman, DO   metoprolol tartrate (LOPRESSOR) 25 MG tablet Take 0.5 tablets (12.5 mg total) by mouth 2 (two) times daily. Patient not taking: Reported on 06/24/2017 07/25/16   Laverda Page B, NP  omega-3 acid ethyl esters (LOVAZA) 1 g capsule Take 1 capsule (1 g total) by mouth 2 (two) times daily. Patient not taking: Reported on 06/24/2017 07/25/16   Laverda Page B, NP  oxyCODONE-acetaminophen (PERCOCET) 5-325 MG tablet Take 1 tablet by mouth every 4 (four) hours as needed for severe pain. 06/27/17 06/27/18  Myrene Buddy, MD  rosuvastatin (CRESTOR) 40 MG tablet Take 1 tablet (40 mg total) by mouth daily at 6 PM. 06/27/17   Myrene Buddy, MD  ticagrelor (BRILINTA) 90 MG TABS tablet Take 1 tablet (90 mg total) by mouth 2 (two) times daily. 06/27/17   Myrene Buddy, MD    Allergies  Allergen Reactions  . Hydrocodone-Acetaminophen Itching  . Ibuprofen     "interacted with meds" per pt's family     Family History  Problem Relation Age of Onset  . Heart attack Mother   . Heart attack Father     Social History Social History   Tobacco Use  . Smoking status: Current Every Day Smoker    Packs/day: 1.00    Years: 30.00    Pack years: 30.00    Types: Cigarettes  . Smokeless tobacco: Never Used  Substance Use Topics  . Alcohol use: Yes    Comment: 08/30/2014 "I drink a couple times/month"  . Drug use: No    Types: Marijuana    Comment: "back in my younger days"    Review of Systems Constitutional: Negative for fever. Cardiovascular: Posterior left chest/left upper back pain. Respiratory: Negative for shortness of breath. Gastrointestinal: Negative for abdominal pain positive for nausea. Genitourinary: Negative for urinary compaints Musculoskeletal: Negative for leg pain or swelling Skin: Diaphoresis earlier, since resolved Neurological: Negative for headache All other ROS negative  ____________________________________________   PHYSICAL EXAM:  VITAL SIGNS: ED Triage Vitals  Enc  Vitals Group     BP 09/28/17 0801 (!) 164/88     Pulse Rate 09/28/17 0801 80     Resp 09/28/17 0801 (!) 24     Temp 09/28/17 0801 98.6 F (37 C)     Temp Source 09/28/17 0801 Oral     SpO2 09/28/17 0801 99 %     Weight 09/28/17 0800 150 lb (68 kg)     Height 09/28/17 0800 5\' 4"  (1.626 m)     Head Circumference --      Peak Flow --      Pain Score 09/28/17 0800 10     Pain Loc --      Pain Edu? --      Excl. in GC? --    Constitutional: Alert and oriented.  Tearful, mild distress due to pain. Eyes: Normal exam ENT   Head: Normocephalic and atraumatic   Mouth/Throat: Mucous membranes are moist. Cardiovascular: Normal rate, regular rhythm. No murmur Respiratory:  Normal respiratory effort without tachypnea nor retractions. Breath sounds are clear Gastrointestinal: Soft and nontender. No distention. Musculoskeletal: Nontender with normal range of motion in all extremities. No lower extremity tenderness or edema. Neurologic:  Normal speech and language. No gross focal neurologic deficits  Skin:  Skin is warm, dry and intact.  Psychiatric: Mood and affect are normal.   ____________________________________________    EKG  EKG reviewed and interpreted by myself Shows normal sinus rhythm 83 bpm with a narrow QRS, normal axis, normal intervals, nonspecific ST changes  ____________________________________________    RADIOLOGY  CT scan negative for PE  ____________________________________________   INITIAL IMPRESSION / ASSESSMENT AND PLAN / ED COURSE  Pertinent labs & imaging results that were available during my care of the patient were reviewed by me and considered in my medical decision making (see chart for details).  Patient presents to the emergency department for left posterior chest/upper back pain ongoing over the past 1 to 2 hours.  Patient does have a significant cardiac history including one prior cardiac stent.  Differential would include ACS, chest wall  pain, musculoskeletal pain, pulmonary embolism.  Given the patient's history of DVT although on Eliquis we will proceed with CT angiography of the chest to rule out pulmonary embolism, will check labs including cardiac enzymes, dose pain medication, nausea medication and continue to closely monitor.  CT scan negative for acute abnormality or PE.  EKG shows no acute abnormality.  Lab work is initially reassuring.  Repeat troponin remains negative.  Highly suspect chest wall or musculoskeletal discomfort.  Improved with pain medication.  We will discharge with a short course of Percocet have the patient follow-up with her doctor.  ____________________________________________   FINAL CLINICAL IMPRESSION(S) / ED DIAGNOSES  Left posterior chest pain    Minna Antis, MD 09/28/17 1204

## 2017-09-28 NOTE — ED Triage Notes (Signed)
Here for upper back pain between shoulder blades.  Radiates to left shoulder. Nauseated. Last MI was back pain. Started today and will not resolved. On eliquis for DVT.  Appears uncomfortable. Tearful.

## 2018-09-13 ENCOUNTER — Other Ambulatory Visit: Payer: Self-pay

## 2018-09-13 DIAGNOSIS — Z20822 Contact with and (suspected) exposure to covid-19: Secondary | ICD-10-CM

## 2018-09-14 LAB — NOVEL CORONAVIRUS, NAA: SARS-CoV-2, NAA: NOT DETECTED

## 2018-09-15 ENCOUNTER — Telehealth: Payer: Self-pay | Admitting: General Practice

## 2018-09-15 NOTE — Telephone Encounter (Signed)
patient asked nurse Latricia Heft. to mail  COVID results to home, done.

## 2019-03-01 ENCOUNTER — Emergency Department: Payer: Self-pay

## 2019-03-01 ENCOUNTER — Inpatient Hospital Stay (HOSPITAL_COMMUNITY)
Admit: 2019-03-01 | Discharge: 2019-03-01 | Disposition: A | Discharge status: Home / Self Care | Payer: Self-pay | Attending: Physician Assistant | Admitting: Physician Assistant

## 2019-03-01 ENCOUNTER — Inpatient Hospital Stay
Admission: EM | Admit: 2019-03-01 | Discharge: 2019-03-03 | DRG: 287 | Disposition: A | Discharge status: Home / Self Care | Payer: Self-pay | Attending: Internal Medicine | Admitting: Internal Medicine

## 2019-03-01 ENCOUNTER — Other Ambulatory Visit: Payer: Self-pay

## 2019-03-01 DIAGNOSIS — Z9119 Patient's noncompliance with other medical treatment and regimen: Secondary | ICD-10-CM

## 2019-03-01 DIAGNOSIS — M1990 Unspecified osteoarthritis, unspecified site: Secondary | ICD-10-CM | POA: Diagnosis present

## 2019-03-01 DIAGNOSIS — Z888 Allergy status to other drugs, medicaments and biological substances status: Secondary | ICD-10-CM

## 2019-03-01 DIAGNOSIS — I200 Unstable angina: Secondary | ICD-10-CM | POA: Diagnosis present

## 2019-03-01 DIAGNOSIS — I10 Essential (primary) hypertension: Secondary | ICD-10-CM | POA: Diagnosis present

## 2019-03-01 DIAGNOSIS — I25110 Atherosclerotic heart disease of native coronary artery with unstable angina pectoris: Principal | ICD-10-CM | POA: Diagnosis present

## 2019-03-01 DIAGNOSIS — R200 Anesthesia of skin: Secondary | ICD-10-CM | POA: Diagnosis present

## 2019-03-01 DIAGNOSIS — G8929 Other chronic pain: Secondary | ICD-10-CM | POA: Diagnosis present

## 2019-03-01 DIAGNOSIS — Z9861 Coronary angioplasty status: Secondary | ICD-10-CM

## 2019-03-01 DIAGNOSIS — F17200 Nicotine dependence, unspecified, uncomplicated: Secondary | ICD-10-CM | POA: Diagnosis present

## 2019-03-01 DIAGNOSIS — F419 Anxiety disorder, unspecified: Secondary | ICD-10-CM | POA: Diagnosis present

## 2019-03-01 DIAGNOSIS — E785 Hyperlipidemia, unspecified: Secondary | ICD-10-CM | POA: Diagnosis present

## 2019-03-01 DIAGNOSIS — E7800 Pure hypercholesterolemia, unspecified: Secondary | ICD-10-CM | POA: Diagnosis present

## 2019-03-01 DIAGNOSIS — R079 Chest pain, unspecified: Secondary | ICD-10-CM

## 2019-03-01 DIAGNOSIS — Z8249 Family history of ischemic heart disease and other diseases of the circulatory system: Secondary | ICD-10-CM

## 2019-03-01 DIAGNOSIS — Z885 Allergy status to narcotic agent status: Secondary | ICD-10-CM

## 2019-03-01 DIAGNOSIS — E1165 Type 2 diabetes mellitus with hyperglycemia: Secondary | ICD-10-CM | POA: Diagnosis present

## 2019-03-01 DIAGNOSIS — Z79899 Other long term (current) drug therapy: Secondary | ICD-10-CM

## 2019-03-01 DIAGNOSIS — I252 Old myocardial infarction: Secondary | ICD-10-CM

## 2019-03-01 DIAGNOSIS — F17210 Nicotine dependence, cigarettes, uncomplicated: Secondary | ICD-10-CM | POA: Diagnosis present

## 2019-03-01 DIAGNOSIS — Z720 Tobacco use: Secondary | ICD-10-CM

## 2019-03-01 DIAGNOSIS — G43909 Migraine, unspecified, not intractable, without status migrainosus: Secondary | ICD-10-CM | POA: Diagnosis present

## 2019-03-01 DIAGNOSIS — I25119 Atherosclerotic heart disease of native coronary artery with unspecified angina pectoris: Secondary | ICD-10-CM

## 2019-03-01 DIAGNOSIS — Z9114 Patient's other noncompliance with medication regimen: Secondary | ICD-10-CM

## 2019-03-01 DIAGNOSIS — M545 Low back pain: Secondary | ICD-10-CM | POA: Diagnosis present

## 2019-03-01 DIAGNOSIS — J449 Chronic obstructive pulmonary disease, unspecified: Secondary | ICD-10-CM | POA: Diagnosis present

## 2019-03-01 DIAGNOSIS — Z20822 Contact with and (suspected) exposure to covid-19: Secondary | ICD-10-CM | POA: Diagnosis present

## 2019-03-01 DIAGNOSIS — I2510 Atherosclerotic heart disease of native coronary artery without angina pectoris: Secondary | ICD-10-CM | POA: Diagnosis present

## 2019-03-01 DIAGNOSIS — F329 Major depressive disorder, single episode, unspecified: Secondary | ICD-10-CM | POA: Diagnosis present

## 2019-03-01 DIAGNOSIS — Z86718 Personal history of other venous thrombosis and embolism: Secondary | ICD-10-CM

## 2019-03-01 DIAGNOSIS — K219 Gastro-esophageal reflux disease without esophagitis: Secondary | ICD-10-CM | POA: Diagnosis present

## 2019-03-01 DIAGNOSIS — E118 Type 2 diabetes mellitus with unspecified complications: Secondary | ICD-10-CM | POA: Diagnosis present

## 2019-03-01 HISTORY — DX: ST elevation (STEMI) myocardial infarction involving other coronary artery of inferior wall: I21.19

## 2019-03-01 LAB — BASIC METABOLIC PANEL
Anion gap: 9 (ref 5–15)
BUN: 17 mg/dL (ref 6–20)
CO2: 24 mmol/L (ref 22–32)
Calcium: 9.1 mg/dL (ref 8.9–10.3)
Chloride: 105 mmol/L (ref 98–111)
Creatinine, Ser: 0.58 mg/dL (ref 0.44–1.00)
GFR calc Af Amer: >60 mL/min (ref >60)
GFR calc non Af Amer: >60 mL/min (ref >60)
Glucose, Bld: 227 mg/dL — ABNORMAL HIGH (ref 70–99)
Potassium: 4.2 mmol/L (ref 3.5–5.1)
Sodium: 138 mmol/L (ref 135–145)

## 2019-03-01 LAB — HEPATIC FUNCTION PANEL
ALT: 24 U/L (ref 0–44)
AST: 16 U/L (ref 15–41)
Albumin: 4.2 g/dL (ref 3.5–5.0)
Alkaline Phosphatase: 74 U/L (ref 38–126)
Bilirubin, Direct: <0.1 mg/dL (ref 0.0–0.2)
Total Bilirubin: 0.9 mg/dL (ref 0.3–1.2)
Total Protein: 7 g/dL (ref 6.5–8.1)

## 2019-03-01 LAB — CBC WITH DIFFERENTIAL/PLATELET
Abs Immature Granulocytes: 0.1 10*3/uL — ABNORMAL HIGH (ref 0.00–0.07)
Basophils Absolute: 0.1 10*3/uL (ref 0.0–0.1)
Basophils Relative: 1 %
Eosinophils Absolute: 0.3 10*3/uL (ref 0.0–0.5)
Eosinophils Relative: 5 %
HCT: 39.2 % (ref 36.0–46.0)
Hemoglobin: 13.4 g/dL (ref 12.0–15.0)
Immature Granulocytes: 1 %
Lymphocytes Relative: 34 %
Lymphs Abs: 2.4 10*3/uL (ref 0.7–4.0)
MCH: 31.5 pg (ref 26.0–34.0)
MCHC: 34.2 g/dL (ref 30.0–36.0)
MCV: 92 fL (ref 80.0–100.0)
Monocytes Absolute: 0.7 10*3/uL (ref 0.1–1.0)
Monocytes Relative: 10 %
Neutro Abs: 3.4 10*3/uL (ref 1.7–7.7)
Neutrophils Relative %: 49 %
Platelets: 172 10*3/uL (ref 150–400)
RBC: 4.26 MIL/uL (ref 3.87–5.11)
RDW: 15.9 % — ABNORMAL HIGH (ref 11.5–15.5)
WBC: 6.9 10*3/uL (ref 4.0–10.5)
nRBC: 0 % (ref 0.0–0.2)

## 2019-03-01 LAB — TROPONIN I (HIGH SENSITIVITY)
Troponin I (High Sensitivity): 3 ng/L (ref <18)
Troponin I (High Sensitivity): 4 ng/L (ref <18)
Troponin I (High Sensitivity): 4 ng/L (ref <18)
Troponin I (High Sensitivity): 3 ng/L (ref <18)

## 2019-03-01 LAB — T4, FREE: Free T4: 0.62 ng/dL (ref 0.61–1.12)

## 2019-03-01 LAB — TSH: TSH: 4.693 u[IU]/mL — ABNORMAL HIGH (ref 0.350–4.500)

## 2019-03-01 LAB — MAGNESIUM: Magnesium: 1.8 mg/dL (ref 1.7–2.4)

## 2019-03-01 LAB — HEPARIN LEVEL (UNFRACTIONATED): Heparin Unfractionated: 0.18 IU/mL — ABNORMAL LOW (ref 0.30–0.70)

## 2019-03-01 LAB — APTT
aPTT: >160 seconds (ref 24–36)
aPTT: 44 seconds — ABNORMAL HIGH (ref 24–36)

## 2019-03-01 LAB — PROTIME-INR
INR: 1.2 (ref 0.8–1.2)
Prothrombin Time: 14.8 seconds (ref 11.4–15.2)

## 2019-03-01 LAB — RESPIRATORY PANEL BY RT PCR (FLU A&B, COVID)
Influenza A by PCR: NEGATIVE
Influenza B by PCR: NEGATIVE
SARS Coronavirus 2 by RT PCR: NEGATIVE

## 2019-03-01 LAB — GLUCOSE, CAPILLARY
Glucose-Capillary: 168 mg/dL — ABNORMAL HIGH (ref 70–99)
Glucose-Capillary: 222 mg/dL — ABNORMAL HIGH (ref 70–99)

## 2019-03-01 LAB — HIV ANTIBODY (ROUTINE TESTING W REFLEX): HIV Screen 4th Generation wRfx: NONREACTIVE

## 2019-03-01 MED ORDER — SODIUM CHLORIDE 0.9 % IV SOLN
1.0000 mg/h | INTRAVENOUS | Status: DC
  Filled 2019-03-01: qty 5

## 2019-03-01 MED ORDER — HEPARIN BOLUS VIA INFUSION
2000.0000 [IU] | Freq: Once | INTRAVENOUS | Status: AC
  Administered 2019-03-02: 2000 [IU] via INTRAVENOUS
  Filled 2019-03-01: qty 2000

## 2019-03-01 MED ORDER — MORPHINE SULFATE (PF) 2 MG/ML IV SOLN
2.0000 mg | INTRAVENOUS | Status: DC | PRN
  Administered 2019-03-01 – 2019-03-03 (×9): 2 mg via INTRAVENOUS
  Filled 2019-03-01 (×9): qty 1

## 2019-03-01 MED ORDER — ASPIRIN EC 81 MG PO TBEC
81.0000 mg | DELAYED_RELEASE_TABLET | Freq: Every day | ORAL | Status: DC
  Administered 2019-03-02 – 2019-03-03 (×2): 81 mg via ORAL
  Filled 2019-03-01: qty 1

## 2019-03-01 MED ORDER — ASPIRIN 300 MG RE SUPP: 300.0000 mg | RECTAL | Status: DC

## 2019-03-01 MED ORDER — IOHEXOL 350 MG/ML SOLN
75.0000 mL | Freq: Once | INTRAVENOUS | Status: AC | PRN
  Administered 2019-03-01: 11:00:00 75 mL via INTRAVENOUS

## 2019-03-01 MED ORDER — ATORVASTATIN CALCIUM 80 MG PO TABS
80.0000 mg | ORAL_TABLET | Freq: Every day | ORAL | Status: DC
  Administered 2019-03-01 – 2019-03-02 (×2): 80 mg via ORAL
  Filled 2019-03-01 (×2): qty 1

## 2019-03-01 MED ORDER — NITROGLYCERIN 0.4 MG SL SUBL: 0.4000 mg | SUBLINGUAL_TABLET | SUBLINGUAL | Status: DC | PRN

## 2019-03-01 MED ORDER — MORPHINE SULFATE (PF) 4 MG/ML IV SOLN
4.0000 mg | Freq: Once | INTRAVENOUS | Status: AC
  Administered 2019-03-01: 11:00:00 4 mg via INTRAVENOUS
  Filled 2019-03-01: qty 1

## 2019-03-01 MED ORDER — ASPIRIN 81 MG PO CHEW: 324.0000 mg | CHEWABLE_TABLET | ORAL | Status: DC

## 2019-03-01 MED ORDER — INSULIN ASPART 100 UNIT/ML ~~LOC~~ SOLN
0.0000 [IU] | Freq: Three times a day (TID) | SUBCUTANEOUS | Status: DC
  Administered 2019-03-01 – 2019-03-02 (×4): 2 [IU] via SUBCUTANEOUS
  Administered 2019-03-03: 1 [IU] via SUBCUTANEOUS
  Filled 2019-03-01 (×5): qty 1

## 2019-03-01 MED ORDER — ONDANSETRON HCL 4 MG/2ML IJ SOLN: 4.0000 mg | Freq: Four times a day (QID) | INTRAMUSCULAR | Status: DC | PRN

## 2019-03-01 MED ORDER — NITROGLYCERIN 0.4 MG SL SUBL
0.4000 mg | SUBLINGUAL_TABLET | SUBLINGUAL | Status: DC | PRN
  Administered 2019-03-01 (×2): 0.4 mg via SUBLINGUAL
  Filled 2019-03-01: qty 1

## 2019-03-01 MED ORDER — HEPARIN (PORCINE) 25000 UT/250ML-% IV SOLN
1200.0000 [IU]/h | INTRAVENOUS | Status: DC
  Administered 2019-03-01: 17:00:00 850 [IU]/h via INTRAVENOUS
  Administered 2019-03-02: 1050 [IU]/h via INTRAVENOUS
  Filled 2019-03-01 (×2): qty 250

## 2019-03-01 MED ORDER — HYDROMORPHONE HCL 1 MG/ML IJ SOLN
1.0000 mg | Freq: Once | INTRAMUSCULAR | Status: AC
  Administered 2019-03-01: 14:00:00 1 mg via INTRAVENOUS
  Filled 2019-03-01: qty 1

## 2019-03-01 MED ORDER — METOPROLOL TARTRATE 25 MG PO TABS
12.5000 mg | ORAL_TABLET | Freq: Two times a day (BID) | ORAL | Status: DC
  Administered 2019-03-01 – 2019-03-02 (×4): 12.5 mg via ORAL
  Filled 2019-03-01 (×4): qty 1

## 2019-03-01 MED ORDER — ENOXAPARIN SODIUM 80 MG/0.8ML ~~LOC~~ SOLN
1.0000 mg/kg | Freq: Two times a day (BID) | SUBCUTANEOUS | Status: DC
  Filled 2019-03-01: qty 0.8

## 2019-03-01 MED ORDER — ACETAMINOPHEN 325 MG PO TABS
650.0000 mg | ORAL_TABLET | ORAL | Status: DC | PRN
  Administered 2019-03-01 – 2019-03-03 (×5): 650 mg via ORAL
  Filled 2019-03-01 (×4): qty 2

## 2019-03-01 MED ORDER — HEPARIN BOLUS VIA INFUSION
4000.0000 [IU] | Freq: Once | INTRAVENOUS | Status: AC
  Administered 2019-03-01: 17:00:00 4000 [IU] via INTRAVENOUS
  Filled 2019-03-01: qty 4000

## 2019-03-01 NOTE — ED Notes (Signed)
Upon interviewing the patient regarding home medications, she seemed slightly disoriented and anxious. She said that she wished her husband was with here as he seems to have more knowledge of the situation. I contacted the patient's husband who reported she has been uninsured and unable to afford medications (historically ELIQUIS and BRILINTA). The husband also expressed frustration that he was unable to be with the patient, especially since he seems to have  more comprehensive knowledge of the patient's medical history. We spoke for about 10 minutes and I offered assurances that I would communicate his concerns and frustrations to the care team. Both seem to feel patient's patient would be better served if her husband were allowed to be with her in the emergency department. Request care team contact the husband to discuss this possibility.   Patient's spouse also requests that patient receive support for medication expenses and, potentially, applying for disability benefits as he reports patient's cardiac history makes her current work difficult or detrimental to her health.   ** The above is intended solely for informational and/or communicative purposes. It should in no way be considered an endorsement of any specific treatment, therapy or action. **

## 2019-03-01 NOTE — ED Provider Notes (Signed)
[facility] Emergency Department Provider Note   ____________________________________________   First MD Initiated Contact with Patient 03/01/19 0957     (approximate)  I have reviewed the triage vital signs and the nursing notes.   HISTORY  Chief Complaint Chest Pain    HPI Krista Alvarez is a 49 y.o. female with past medical history of CAD, CHF, COPD, DVT, and GERD who presents to the ED complaining of chest pain.  Patient reports that she had acute onset of squeezing pain in the center of her chest while she was at work earlier today.  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  She has had some similar intermittent pain over the past month, during which she has run out of all of her medications, but the pain was much more severe today.  She has had some shortness of breath, but denies any fevers or cough.  She also endorses nausea and diaphoresis, has not vomited.  EMS administered loading dose of aspirin prior to arrival.        Past Medical History:  Diagnosis Date  . Anxiety   . Arthritis    "hips" (08/30/2014)  . Asthma   . CHF (congestive heart failure) ([place])   . Chronic lower back pain   . COPD (chronic obstructive pulmonary disease) ([place])   . Coronary artery disease    07/23/16-STEMI PCI -->DES to RCA, Ef normal   . Depression   . DVT (deep venous thrombosis) ([place])   . GERD (gastroesophageal reflux disease)   . Hypercholesterolemia   . Hypertension   . Migraine    "a few times/month" (08/30/2014)  . NSTEMI (non-ST elevated myocardial infarction) ([place]) 08/2013   Archie Endo 09/17/2013  . Pneumonia "several times"  . Type II diabetes mellitus [facility])     Patient Active Problem List   Diagnosis Date Noted  . Unstable angina ([place]) 03/01/2019  . Ischemia of digits of hand 06/25/2017  . 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 Hyperlipidemia 07/25/2016  . Hypertension 07/25/2016  . STEMI involving right coronary artery ([place]) 07/23/2016  . Acute inferoposterior myocardial infarction ([place]) 07/23/2016  . CAD (coronary artery disease)   . Type 2 diabetes mellitus without complication ([place])   . Tobacco abuse   . COLD (chronic obstructive lung disease) ([place])   . Pain in the chest   . Chest pain 08/30/2014  . TOBACCO DEPENDENCE 03/19/2006  . PARESTHESIA NOS 03/19/2006    Past Surgical History:  Procedure Laterality Date  . CARDIAC CATHETERIZATION    . CARDIAC CATHETERIZATION N/A 08/31/2014   Procedure: Left Heart Cath and Coronary Angiography;  Surgeon: Burnell Blanks, MD;  Location: [place] CV LAB;  Service: Cardiovascular;  Laterality: N/A;  . CORONARY ANGIOPLASTY    . CORONARY/GRAFT ACUTE MI REVASCULARIZATION N/A 07/23/2016   Procedure: Coronary/Graft Acute MI Revascularization;  Surgeon: Jettie Booze, MD;  Location: [place] CV LAB;  Service: Cardiovascular;  Laterality: N/A;  . LEFT HEART CATH AND CORONARY ANGIOGRAPHY N/A 07/23/2016   Procedure: Left Heart Cath and Coronary Angiography;  Surgeon: Jettie Booze, MD;  Location: [place] CV LAB;  Service: Cardiovascular;  Laterality: N/A;  . PERCUTANEOUS PINNING PHALANX FRACTURE OF HAND Left    "plate & pins"  . TONSILLECTOMY    . UPPER EXTREMITY ANGIOGRAPHY Left 06/26/2017   Procedure: UPPER EXTREMITY ANGIOGRAPHY;  Surgeon: Nada Libman, MD;  Location: [facility] INVASIVE CV LAB;  Service: Cardiovascular;  Laterality: Left;  TPA administration single bolus    Prior to Admission medications   Medication Sig Start Date End Date Taking? Authorizing Provider  nitroGLYCERIN (NITROSTAT) 0.4 MG SL tablet Place 0.4 mg under the tongue every 5 (five) minutes as needed for chest pain.   Yes [provider]    Allergies Hydrocodone-acetaminophen and Ibuprofen  Family History  Problem Relation Age of Onset  . Heart attack Mother    . Heart attack Father     Social History Social History   Tobacco Use  . Smoking status: Current Every Day Smoker    Packs/day: 1.00    Years: 30.00    Pack years: 30.00    Types: Cigarettes  . Smokeless tobacco: Never Used  Substance Use Topics  . Alcohol use: Yes    Comment: 08/30/2014 "I drink a couple times/month"  . Drug use: No    Types: Marijuana    Comment: "back in my younger days"    Review of Systems  Constitutional: No fever/chills Eyes: No visual changes. ENT: No sore throat. Cardiovascular: Positive for chest pain. Respiratory: Positive for shortness of breath. Gastrointestinal: No abdominal pain.  Positive for nausea, no vomiting.  No diarrhea.  No constipation. Genitourinary: Negative for dysuria. Musculoskeletal: Negative for back pain. Skin: Negative for rash. Neurological: Negative for headaches, focal weakness or numbness.  ____________________________________________   PHYSICAL EXAM:  VITAL SIGNS: ED Triage Vitals  Enc Vitals Group     BP      Pulse      Resp      Temp      Temp src      SpO2      Weight      Height      Head Circumference      Peak Flow      Pain Score      Pain Loc      Pain Edu?      Excl. in GC?     Constitutional: Alert and oriented. Eyes: Conjunctivae are normal. Head: Atraumatic. Nose: No congestion/rhinnorhea. Mouth/Throat: Mucous membranes are moist. Neck: Normal ROM Cardiovascular: Normal rate, regular rhythm. Grossly normal heart sounds.  2+ radial and DP pulses bilaterally. Respiratory: Normal respiratory effort.  No retractions. Lungs CTAB.  No chest wall tenderness to palpation. Gastrointestinal: Soft and nontender. No distention. Genitourinary: deferred Musculoskeletal: No lower extremity tenderness nor edema. Neurologic:  Normal speech and language. No gross focal neurologic deficits are appreciated. Skin:  Skin is warm, dry and intact. No rash noted. Psychiatric: Mood and affect are normal.  Speech and behavior are normal.  ____________________________________________   LABS (all labs ordered are listed, but only abnormal results are displayed)  Labs Reviewed  CBC WITH DIFFERENTIAL/PLATELET - Abnormal; Notable for the following components:      Result Value   RDW 15.9 (*)    Abs Immature Granulocytes 0.10 (*)    All other components within normal limits  BASIC METABOLIC PANEL - Abnormal; Notable for the following components:   Glucose, Bld 227 (*)    All other components within normal limits  RESPIRATORY PANEL BY RT PCR (FLU A&B, COVID)  HIV ANTIBODY (ROUTINE TESTING W REFLEX)  HEMOGLOBIN A1C  TSH  T4, FREE  TROPONIN I (HIGH SENSITIVITY)  TROPONIN I (HIGH SENSITIVITY)  TROPONIN I (HIGH SENSITIVITY)  TROPONIN I (HIGH SENSITIVITY)   ____________________________________________  EKG  ED ECG REPORT  Harriet Masson, the attending physician, personally viewed and interpreted this ECG.   Date: 03/01/2019  EKG Time: 10:01  Rate: 86  Rhythm: normal sinus rhythm  Axis: Normal  Intervals:none  ST&T Change: Inferior Q waves   PROCEDURES  Procedure(s) performed (including Critical Care):  Procedures   ____________________________________________   INITIAL IMPRESSION / ASSESSMENT AND PLAN / ED COURSE       49 year old female with extensive cardiac history and multiple MIs presents to the ED with acute onset squeezing pain in the center of her chest that also radiates towards her back while she was at work earlier today.  Initial EKG shows Q waves inferiorly, which I suspect is from an old MI, otherwise no acute ischemic changes.  Pulses are intact in all of her extremities, however given radiation towards her back will check CTA to rule out dissection.  She was given nitroglycerin x2 with improvement in pain, however it persists at a 2 out of 10 and we will treat with morphine.  Chest x-ray negative for acute process.  Even if work-up is negative, she will  likely require admission for high risk chest pain.  CT is negative for dissection or other acute process, initial troponin within normal limits.  Patient reports her chest pain has resolved following dose of morphine and nitroglycerin x2, however she still continues to complain of back pain.  Given her high risk status, will admit for further work-up of chest pain.      ____________________________________________   FINAL CLINICAL IMPRESSION(S) / ED DIAGNOSES  Final diagnoses:  Nonspecific chest pain  Coronary artery disease involving native coronary artery of native heart with angina pectoris [facility])     ED Discharge Orders    None       Note:  This document was prepared using Dragon voice recognition software and may include unintentional dictation errors.   Chesley Noon, MD 03/01/19 [phone]

## 2019-03-01 NOTE — Consult Note (Addendum)
ANTICOAGULATION CONSULT NOTE - Initial Consult  Pharmacy Consult for Heparin Drip Indication: chest pain/ACS/unstable angina  Allergies  Allergen Reactions  . Hydrocodone-Acetaminophen Itching  . Ibuprofen     "interacted with meds" per pt's family     Patient Measurements: Height: 5\' 4"  (162.6 cm) Weight: 158 lb 6.4 oz (71.8 kg) IBW/kg (Calculated) : 54.7 Heparin Dosing Weight: 69.4kg  Vital Signs: Temp: 98.1 F (36.7 C) (02/09 1019) Temp Source: Oral (02/09 1019) BP: 134/89 (02/09 1522) Pulse Rate: 70 (02/09 1522)  Labs: Recent Labs    03/01/19 1008 03/01/19 1211  HGB 13.4  --   HCT 39.2  --   PLT 172  --   CREATININE 0.58  --   TROPONINIHS 3 4    Estimated Creatinine Clearance: 84.5 mL/min (by C-G formula based on SCr of 0.58 mg/dL).   Medical History: Past Medical History:  Diagnosis Date  . Anxiety   . Arthritis    "hips" (08/30/2014)  . Asthma   . CHF (congestive heart failure) (HCC)   . Chronic lower back pain   . COPD (chronic obstructive pulmonary disease) (HCC)   . Coronary artery disease    07/23/16-STEMI PCI -->DES to RCA, Ef normal   . Depression   . DVT (deep venous thrombosis) (HCC)   . GERD (gastroesophageal reflux disease)   . Hypercholesterolemia   . Hypertension   . Migraine    "a few times/month" (08/30/2014)  . NSTEMI (non-ST elevated myocardial infarction) (HCC) 08/2013   09/2013 09/17/2013  . Pneumonia "several times"  . Type II diabetes mellitus (HCC)     Medications:  No pta anticoagulant of record  Assessment: 49 y.o. female with a hx of moderate diffuse CAD with multiple catheterizations, hypertension, hyperlipidemia, DM2, current tobacco use, and who is being seen today for the evaluation of chest pain.  Pharmacy has been consulted to initiate and monitor a heparin drip for ACS/STEMI/unstable angina.  Goal of Therapy:  Heparin level 0.3-0.7 units/ml Monitor platelets by anticoagulation protocol: Yes   Plan:  Give 4000  units bolus x 1 Start heparin infusion at 850 units/hr Check anti-Xa level in 6 hours and daily while on heparin Continue to monitor H&H and platelets  57, PharmD, BCPS Clinical Pharmacist 03/01/2019 4:28 PM  Addendum: Regarding "Baseline" APTT > 160 seconds  Extremely likely this is just because of the chronological proximity to heparin bolus (~ 10 min) - have consulted with nursing to monitor periodically for any s/sx of bleeding - we have a scheduled check of the heparin level at 11pm - will also check APTT at his time simply because "baseline" was so high - please contact pharmacy at [phone] after 8:30pm if you have any further concerns  [phone], PharmD, BCPS Clinical Pharmacist 03/01/2019 6:43 PM

## 2019-03-01 NOTE — ED Triage Notes (Signed)
Pt arrives via ems from work. Ems reports chest pain starting w/in last hr. Not deeling well and didnt sleep well last night. chest pain left side and on back out of meds for a month. takes SL nitro, metformin for diabeties. Pain burning sensation behind knees pt report as ongoing complication. Ems administered around 342 Asprin. Pt a&o x 4 on arrival. Tearful during triage, md present for assessment.    ems viatls 210/130 HR 78-102 98.1

## 2019-03-01 NOTE — Consult Note (Signed)
ANTICOAGULATION CONSULT NOTE - Initial Consult  Pharmacy Consult for Heparin Drip Indication: chest pain/ACS/unstable angina  Allergies  Allergen Reactions  . Hydrocodone-Acetaminophen Itching  . Ibuprofen     "interacted with meds" per pt's family     Patient Measurements: Height: 5\' 4"  (162.6 cm) Weight: 158 lb 6.4 oz (71.8 kg) IBW/kg (Calculated) : 54.7 Heparin Dosing Weight: 69.4kg  Vital Signs: Temp: 98 F (36.7 C) (02/09 1957) Temp Source: Oral (02/09 1957) BP: 123/76 (02/09 1957) Pulse Rate: 65 (02/09 1957)  Labs: Recent Labs    03/01/19 1008 03/01/19 1008 03/01/19 1211 03/01/19 1529 03/01/19 1725 03/01/19 2258  HGB 13.4  --   --   --   --   --   HCT 39.2  --   --   --   --   --   PLT 172  --   --   --   --   --   APTT  --   --   --   --  >160* 44*  LABPROT  --   --   --   --  14.8  --   INR  --   --   --   --  1.2  --   HEPARINUNFRC  --   --   --   --   --  0.18*  CREATININE 0.58  --   --   --   --   --   TROPONINIHS 3   < > 4 4 3   --    < > = values in this interval not displayed.    Estimated Creatinine Clearance: 84.5 mL/min (by C-G formula based on SCr of 0.58 mg/dL).   Medical History: Past Medical History:  Diagnosis Date  . Acute ST elevation myocardial infarction (STEMI) of inferior wall (HCC)    2018 07/23/16-STEMI PCI -->DES to [facility]   . Anxiety   . Arthritis    "hips" (08/30/2014)  . Asthma   . CHF (congestive heart failure) (HCC)   . Chronic lower back pain   . COPD (chronic obstructive pulmonary disease) (HCC)   . Coronary artery disease    07/23/16-STEMI PCI -->DES to RCA, Ef normal   . Depression   . DVT (deep venous thrombosis) (HCC)   . GERD (gastroesophageal reflux disease)   . Hypercholesterolemia   . Hypertension   . Migraine    "a few times/month" (08/30/2014)  . NSTEMI (non-ST elevated myocardial infarction) (HCC) 08/2013   10/30/2014 09/17/2013  . Pneumonia "several times"  . Type II diabetes mellitus (HCC)      Medications:  No pta anticoagulant of record  Assessment: 49 y.o. female with a hx of moderate diffuse CAD with multiple catheterizations, hypertension, hyperlipidemia, DM2, current tobacco use, and who is being seen today for the evaluation of chest pain.  Pharmacy has been consulted to initiate and monitor a heparin drip for ACS/STEMI/unstable angina.  Goal of Therapy:  Heparin level 0.3-0.7 units/ml Monitor platelets by anticoagulation protocol: Yes   Plan:  02/09 @ 2200 HL 0.18, aPTT 44 seconds. Will rebolus w/ heparin 2000 units IV x 1 and increase rate to 950 units/hr and will recheck HL at 0600 and continue to monitor.  58, PharmD, BCPS Clinical Pharmacist 03/01/2019 11:36 PM

## 2019-03-01 NOTE — H&P (Signed)
History and Physical    Krista Alvarez WSF:[id number] DOB: Dec 09, 1970 DOA: 03/01/2019   PCP: Loletta Specter, PA-C    Patient coming from: home  Chief Complaint: chest pain.  HPI: Krista Alvarez is a 49 y.o. female with medical history significant of  CAD seen in ed with c/o chest pain. Pt has a significant cardiac history and is here for chest pain,[facility] [place]. Pt still smokes 1 pp2days. , pt started to smoke since age of 64.  Counseled pt extensively on tobacco abuse and risk factor for smoking. Pt is crying because of her pain. Pt also has a lot of arthritis pain in her back as well.   ED Course:  Blood pressure 113/79, pulse 78, temperature 98.1 F (36.7 C), temperature source Oral, resp. rate 13, height 5\' 4"  (1.626 m), weight 63.5 kg, SpO2 98 %. Pt received morphine and sl ntg and pt is chest pain free. EKG did not show any changes.  The nitroglycerine helped her but chest pain is in her upper back and rad to her left shoulder nad left arm.   Review of Systems: As per HPI otherwise 10 point review of systems negative.    Past Medical History:  Diagnosis Date  . Acute ST elevation myocardial infarction (STEMI) of inferior wall (HCC)    2018 07/23/16-STEMI PCI -->DES to [facility]   . Anxiety   . Arthritis    "hips" (08/30/2014)  . Asthma   . CHF (congestive heart failure) (HCC)   . Chronic lower back pain   . COPD (chronic obstructive pulmonary disease) (HCC)   . Coronary artery disease    07/23/16-STEMI PCI -->DES to RCA, Ef normal   . Depression   . DVT (deep venous thrombosis) (HCC)   . GERD (gastroesophageal reflux disease)   . Hypercholesterolemia   . Hypertension   . Migraine    "a few times/month" (08/30/2014)  . NSTEMI (non-ST elevated myocardial infarction) (HCC) 08/2013   09/2013 09/17/2013  . Pneumonia "several times"  . Type II diabetes mellitus (HCC)     Past Surgical History:  Procedure Laterality Date  . CARDIAC  CATHETERIZATION    . CARDIAC CATHETERIZATION N/A 08/31/2014   Procedure: Left Heart Cath and Coronary Angiography;  Surgeon: 10/31/2014, MD;  Location: [facility] INVASIVE CV LAB;  Service: Cardiovascular;  Laterality: N/A;  . CORONARY ANGIOPLASTY    . CORONARY/GRAFT ACUTE MI REVASCULARIZATION N/A 07/23/2016   Procedure: Coronary/Graft Acute MI Revascularization;  Surgeon: 09/23/2016, MD;  Location: [facility] INVASIVE CV LAB;  Service: Cardiovascular;  Laterality: N/A;  . LEFT HEART CATH AND CORONARY ANGIOGRAPHY N/A 07/23/2016   Procedure: Left Heart Cath and Coronary Angiography;  Surgeon: 09/23/2016, MD;  Location: [facility] INVASIVE CV LAB;  Service: Cardiovascular;  Laterality: N/A;  . PERCUTANEOUS PINNING PHALANX FRACTURE OF HAND Left    "plate & pins"  . TONSILLECTOMY    . UPPER EXTREMITY ANGIOGRAPHY Left 06/26/2017   Procedure: UPPER EXTREMITY ANGIOGRAPHY;  Surgeon: 08/26/2017, MD;  Location: [facility] INVASIVE CV LAB;  Service: Cardiovascular;  Laterality: Left;  TPA administration single bolus     reports that she has been smoking cigarettes. She has a 30.00 pack-year smoking history. She has never used smokeless tobacco. She reports current alcohol use. She reports that she does not use drugs.  Allergies  Allergen Reactions  . Hydrocodone-Acetaminophen Itching  . Ibuprofen     "interacted with meds" per pt's family  Family History  Problem Relation Age of Onset  . Heart attack Mother   . Heart attack Father      Prior to Admission medications   Medication Sig Start Date End Date Taking? Authorizing Provider  apixaban (ELIQUIS) 5 MG TABS tablet Take 2 tablets (10 mg total) by mouth 2 (two) times daily. Patient taking differently: Take 5 mg by mouth 2 (two) times daily.  06/27/17   Guadalupe Dawn, MD  aspirin EC 81 MG tablet Take 1 tablet (81 mg total) by mouth daily. 06/25/17   Lockamy, Christia Reading, DO  escitalopram (LEXAPRO) 10 MG tablet Take 1 tablet (10 mg total) by  mouth daily. 06/25/17   Lockamy, Christia Reading, DO  Ipratropium-Albuterol (COMBIVENT RESPIMAT) 20-100 MCG/ACT AERS respimat Inhale 1 puff into the lungs 4 (four) times daily. 06/25/17   Nuala Alpha, DO  metoprolol tartrate (LOPRESSOR) 25 MG tablet Take 0.5 tablets (12.5 mg total) by mouth 2 (two) times daily. Patient not taking: Reported on 06/24/2017 07/25/16   Reino Bellis B, NP  omega-3 acid ethyl esters (LOVAZA) 1 g capsule Take 1 capsule (1 g total) by mouth 2 (two) times daily. Patient not taking: Reported on 06/24/2017 07/25/16   Reino Bellis B, NP  oxyCODONE-acetaminophen (PERCOCET) 5-325 MG tablet Take 1 tablet by mouth every 4 (four) hours as needed for severe pain. 09/28/17   Harvest Dark, MD  rosuvastatin (CRESTOR) 40 MG tablet Take 1 tablet (40 mg total) by mouth daily at 6 PM. 06/27/17   Guadalupe Dawn, MD  ticagrelor (BRILINTA) 90 MG TABS tablet Take 1 tablet (90 mg total) by mouth 2 (two) times daily. 06/27/17   Guadalupe Dawn, MD    Physical Exam: Vitals:   03/01/19 1445 03/01/19 1456 03/01/19 1521 03/01/19 1522  BP:  127/72  134/89  Pulse: 75 69  70  Resp: 17 11  18   Temp:      TempSrc:      SpO2: 98% 96%  99%  Weight:   71.8 kg   Height:   5\' 4"  (1.626 m)       Constitutional: NAD, calm, comfortable Vitals:   03/01/19 1445 03/01/19 1456 03/01/19 1521 03/01/19 1522  BP:  127/72  134/89  Pulse: 75 69  70  Resp: 17 11  18   Temp:      TempSrc:      SpO2: 98% 96%  99%  Weight:   71.8 kg   Height:   5\' 4"  (1.626 m)    Eyes: PERRL, lids and conjunctivae normal ENMT: Mucous membranes are moist. Posterior pharynx clear of any exudate or lesions.Normal dentition.  Neck: normal, supple, no masses, no thyromegaly Respiratory: clear to auscultation bilaterally, no wheezing, no crackles. Normal respiratory effort. No accessory muscle use.  Cardiovascular: Regular rate and rhythm, no murmurs / rubs / gallops. No extremity edema. 2+ pedal pulses. No carotid bruits.    Abdomen: no tenderness, no masses palpated. No hepatosplenomegaly. Bowel sounds positive.  Musculoskeletal: no clubbing / cyanosis. No joint deformity upper and lower extremities. Good ROM, no contractures. Normal muscle tone.  Skin: no rashes, lesions, ulcers. No induration Neurologic: CN 2-12 grossly intact. Sensation intact, DTR normal. Strength 5/5 in all 4.  Psychiatric: Normal judgment and insight. Alert and oriented x 3. Normal mood.   Labs on Admission: I have personally reviewed following labs and imaging studies  CBC: Recent Labs  Lab 03/01/19 1008  WBC 6.9  NEUTROABS 3.4  HGB 13.4  HCT 39.2  MCV 92.0  PLT  172   Basic Metabolic Panel: Recent Labs  Lab 03/01/19 1008  NA 138  K 4.2  CL 105  CO2 24  GLUCOSE 227*  BUN 17  CREATININE 0.58  CALCIUM 9.1   GFR: Estimated Creatinine Clearance: 84.5 mL/min (by C-G formula based on SCr of 0.58 mg/dL). Liver Function Tests: No results for input(s): AST, ALT, ALKPHOS, BILITOT, PROT, ALBUMIN in the last 168 hours. No results for input(s): LIPASE, AMYLASE in the last 168 hours. No results for input(s): AMMONIA in the last 168 hours. Coagulation Profile: No results for input(s): INR, PROTIME in the last 168 hours. Cardiac Enzymes: No results for input(s): CKTOTAL, CKMB, CKMBINDEX, TROPONINI in the last 168 hours. BNP (last 3 results) No results for input(s): PROBNP in the last 8760 hours. HbA1C: No results for input(s): HGBA1C in the last 72 hours. CBG: No results for input(s): GLUCAP in the last 168 hours. Lipid Profile: No results for input(s): CHOL, HDL, LDLCALC, TRIG, CHOLHDL, LDLDIRECT in the last 72 hours. Thyroid Function Tests: Recent Labs    03/01/19 1529  TSH 4.693*  FREET4 0.62   Anemia Panel: No results for input(s): VITAMINB12, FOLATE, FERRITIN, TIBC, IRON, RETICCTPCT in the last 72 hours. Urine analysis:    Component Value Date/Time   COLORURINE YELLOW (A) 05/25/2016 1028   APPEARANCEUR HAZY  (A) 05/25/2016 1028   APPEARANCEUR Clear 05/16/2012 1220   LABSPEC 1.011 05/25/2016 1028   LABSPEC 1.003 05/16/2012 1220   PHURINE 7.0 05/25/2016 1028   GLUCOSEU NEGATIVE 05/25/2016 1028   GLUCOSEU 50 mg/dL 16/94/5038 8828   HGBUR NEGATIVE 05/25/2016 1028   BILIRUBINUR NEGATIVE 05/25/2016 1028   BILIRUBINUR Negative 05/16/2012 1220   KETONESUR NEGATIVE 05/25/2016 1028   PROTEINUR NEGATIVE 05/25/2016 1028   NITRITE NEGATIVE 05/25/2016 1028   LEUKOCYTESUR NEGATIVE 05/25/2016 1028   LEUKOCYTESUR Negative 05/16/2012 1220    Recent Results (from the past 240 hour(s))  Respiratory Panel by RT PCR (Flu A&B, Covid) - Nasopharyngeal Swab     Status: None   Collection Time: 03/01/19 10:32 AM   Specimen: Nasopharyngeal Swab  Result Value Ref Range Status   SARS Coronavirus 2 by RT PCR NEGATIVE NEGATIVE Final    Comment: (NOTE) SARS-CoV-2 target nucleic acids are NOT DETECTED. The SARS-CoV-2 RNA is generally detectable in upper respiratoy specimens during the acute phase of infection. The lowest concentration of SARS-CoV-2 viral copies this assay can detect is 131 copies/mL. A negative result does not preclude SARS-Cov-2 infection and should not be used as the sole basis for treatment or other patient management decisions. A negative result may occur with  improper specimen collection/handling, submission of specimen other than nasopharyngeal swab, presence of viral mutation(s) within the areas targeted by this assay, and inadequate number of viral copies (<131 copies/mL). A negative result must be combined with clinical observations, patient history, and epidemiological information. The expected result is Negative. Fact Sheet for Patients:  https://www.moore.com/ Fact Sheet for Healthcare Providers:  https://www.young.biz/ This test is not yet ap proved or cleared by the [place] FDA and  has been authorized for detection and/or diagnosis of  SARS-CoV-2 by FDA under an Emergency Use Authorization (EUA). This EUA will remain  in effect (meaning this test can be used) for the duration of the COVID-19 declaration under Section 564(b)(1) of the Act, 21 U.S.C. section 360bbb-3(b)(1), unless the authorization is terminated or revoked sooner.    Influenza A by PCR NEGATIVE NEGATIVE Final   Influenza B by PCR NEGATIVE NEGATIVE Final  Comment: (NOTE) The Xpert Xpress SARS-CoV-2/FLU/RSV assay is intended as an aid in  the diagnosis of influenza from Nasopharyngeal swab specimens and  should not be used as a sole basis for treatment. Nasal washings and  aspirates are unacceptable for Xpert Xpress SARS-CoV-2/FLU/RSV  testing. Fact Sheet for Patients: https://www.moore.com/ Fact Sheet for Healthcare Providers: https://www.young.biz/ This test is not yet approved or cleared by the [place] FDA and  has been authorized for detection and/or diagnosis of SARS-CoV-2 by  FDA under an Emergency Use Authorization (EUA). This EUA will remain  in effect (meaning this test can be used) for the duration of the  Covid-19 declaration under Section 564(b)(1) of the Act, 21  U.S.C. section 360bbb-3(b)(1), unless the authorization is  terminated or revoked. Performed at [facility], [address] [place], [place] [address]      Radiological Exams on Admission: DG Chest 2 View  Result Date: 03/01/2019 CLINICAL DATA:  Chest pain, pain in mid back and left arm. EXAM: CHEST - 2 VIEW COMPARISON:  06/24/2017 FINDINGS: Heart size and mediastinal contours are normal. Hilar structures are unremarkable. Lungs are clear. No signs of pleural effusion. Visualized skeletal structures are unremarkable. IMPRESSION: Normal exam. Electronically Signed   By: Donzetta Kohut M.D.   On: 03/01/2019 10:57   CT Angio Chest/Abd/Pel for Dissection W and/or W/WO  Result Date: 03/01/2019 CLINICAL DATA:  Chest and back  pain. EXAM: CT ANGIOGRAPHY CHEST, ABDOMEN AND PELVIS TECHNIQUE: Multidetector CT imaging through the chest, abdomen and pelvis was performed using the standard protocol during bolus administration of intravenous contrast. Multiplanar reconstructed images and MIPs were obtained and reviewed to evaluate the vascular anatomy. CONTRAST:  61mL OMNIPAQUE IOHEXOL 350 MG/ML SOLN COMPARISON:  September 28, 2017. FINDINGS: CTA CHEST FINDINGS Cardiovascular: Preferential opacification of the thoracic aorta. No evidence of thoracic aortic aneurysm or dissection. Normal heart size. No pericardial effusion. Mediastinum/Nodes: No enlarged mediastinal, hilar, or axillary lymph nodes. Thyroid gland, trachea, and esophagus demonstrate no significant findings. Lungs/Pleura: Lungs are clear. No pleural effusion or pneumothorax. Musculoskeletal: No chest wall abnormality. No acute or significant osseous findings. Review of the MIP images confirms the above findings. CTA ABDOMEN AND PELVIS FINDINGS VASCULAR Aorta: Normal caliber aorta without aneurysm, dissection, vasculitis or significant stenosis. Celiac: Patent without evidence of aneurysm, dissection, vasculitis or significant stenosis. SMA: Patent without evidence of aneurysm, dissection, vasculitis or significant stenosis. Renals: Both renal arteries are patent without evidence of aneurysm, dissection, vasculitis, fibromuscular dysplasia or significant stenosis. IMA: Patent without evidence of aneurysm, dissection, vasculitis or significant stenosis. Inflow: Patent without evidence of aneurysm, dissection, vasculitis or significant stenosis. Veins: No obvious venous abnormality within the limitations of this arterial phase study. Review of the MIP images confirms the above findings. NON-VASCULAR Hepatobiliary: No focal liver abnormality is seen. No gallstones, gallbladder wall thickening, or biliary dilatation. Pancreas: Unremarkable. No pancreatic ductal dilatation or surrounding  inflammatory changes. Spleen: Normal in size without focal abnormality. Adrenals/Urinary Tract: Adrenal glands are unremarkable. Kidneys are normal, without renal calculi, focal lesion, or hydronephrosis. Bladder is unremarkable. Stomach/Bowel: Stomach is within normal limits. Appendix appears normal. No evidence of bowel wall thickening, distention, or inflammatory changes. Lymphatic: No significant adenopathy is noted. Reproductive: Uterus and bilateral adnexa are unremarkable. Other: No abdominal wall hernia or abnormality. No abdominopelvic ascites. Musculoskeletal: No acute or significant osseous findings. Review of the MIP images confirms the above findings. IMPRESSION: 1. No evidence of thoracic or abdominal aortic dissection or aneurysm. 2. No definite abnormality seen  in the chest, abdomen or pelvis. Electronically Signed   By: Lupita Raider M.D.   On: 03/01/2019 11:47    EKG: Independently reviewed. Sinus  86 and prominent p waves and lae.   Assessment/Plan Principal Problem:   Unstable angina (HCC) Active Problems:   CAD (coronary artery disease)   Type 2 diabetes mellitus without complication (HCC)   Tobacco abuse   Hypertension  UA/ CAD: -due to moderate to high risk of Cad we have requested cardiology consult.  -cont prn ntg sl /asa/ heparin./ statin therapy . -pt is to have stress test in am  -Greatly apprecaite cardiology consult.   DM II: Ssi/a1c/ hypoglycemia protocol Pt off meds for about a month.  Tobacco Abuse: pt did not want nicotine patch. counseled in detail about risk of ongoing smoking.   HTN: Pt continued on metoprolol.    DVT prophylaxis: Lovenox. Code Status: Full  Family Communication:Glen Aikman spouse: [phone] Disposition Plan: home Consults called: cardiology Admission status: inpatient   Gertha Calkin MD [facility] 7PM-7AM, please contact night-coverage [email or web address] Password [facility] 03/01/2019, 4:54 PM

## 2019-03-01 NOTE — Consult Note (Signed)
Cardiology Consultation:   Patient ID: Krista Alvarez MRN: [id number]; DOB: January 01, 1971  Admit date: 03/01/2019 Date of Consult: 03/01/2019  Primary Care Provider: Loletta Specter, PA-C Primary Cardiologist: Previously Dr. Welton Flakes. New [facility], Dr. Okey Dupre rounding Primary Electrophysiologist:  None    Patient Profile:   Krista Alvarez is a 49 y.o. female with a hx of moderate diffuse CAD with multiple catheterizations and most recently a 2018 cath 2/2 inferior STEMI with PCI to [facility] (see CV studies and HPI below) and previously on DAPT (discontinued 2/2 financial barriers), vascular dz s/p 06/2017 left hand/digital arteries angio with intra-arterial administration of TPA/nitro, financial barriers, G1DD, hypertension, hyperlipidemia, DM2, current tobacco use, and who is being seen today for the evaluation of chest pain at the request of Dr. Allena Katz.  History of Present Illness:   Krista Alvarez is a 49 year old female with PMH as above.  She has a known family history of severe cardiac disease.  She has seen Dr. Welton Flakes in the past (2015) but since followed with [facility] with request for [facility] this admission.   In 08/2013, she was seen by Dr. Welton Flakes for CP after she presented to the [facility] ED with 5/10 chest pain.  She reported chest pain that had started the previous day and was described as pressure with associated shortness of breath and diaphoresis.  She also reported nausea and emesis. She was started on aggrastat IV with BB and ASA. The next day, she underwent cardiac catheterization for NSTEMI that (per review of previous progress notes as cath report not available) showed 60 to 70% distal LAD stenosis with mild to moderate disease in the RCA.  After that time, she still had frequent chest pain and took sublingual nitroglycerin only when the chest pain was severe.   In 08/2014, she was seen at [facility] for left sided stabbing chest pain that radiated to her left shoulder and was not relieved with SL nitro. It was  preceded by nausea and dry heaving, as well as cough and shortness of breath. She reported the chest pain was similar to her 2015 MI.  She underwent cardiac catheterization 2/2 UA that showed mild to moderate diffuse triple-vessel CAD with diabetic appearing vessels but no focal severe stenosis (see above report).  The LVSF was normal.  Recommendation was for continued medical management, smoking cessation, and further risk factor modification.   In 07/2016, she was seen again at [facility] for substernal chest pain that was described as pressure-like, 10/10 in intensity, and associated with dyspnea and diaphoresis.  She stated it was similar to her 2016 pain.  She had reportedly been out of clopidogrel for over a month.  She also reported prior intermittent episodes of CP that would resolve with SL NTG.  She stated that this particular episode did not resolve with sublingual nitro.  EKG consistent with inferior posterior STEMI.  HS Tn peaked at 4.78. She underwent emergent cardiac catheterization with 99% mRCA stenosis and PCI to [facility].  She was loaded with Brilinta during the catheterization.  A Synergy stent was placed to her mRCA lesion, given she had not been compliant with her ASA and Plavix and reportedly had been out of DAPT (with ASA and Plavix) before the admission.   EF reported 35 to 45% with inferior hypokinesis.  LVEDP was mildly elevated.  She was continued on Angiomax post-cath. Aggressive secondary prevention was advised.  She was discharged on DAPT with ASA and Brilinta, BB (escalation precluded by bradycardia), statin with Lovaza 1g BID.  No showed mid and basal inferior wall hypokinesis.  EF 50 to 55%.  She was last to follow-up.  In 06/2017, she underwent left hand angio with intra-arterial administration of TPA and nitro with improvement in angiographic evaluation of digital arteries.  Heparin was restarted 4 hours after sheath removal with transition oral anticoagulation recommendation for  follow-up in 3 to 4 weeks.  It was noted that the patient still had filling defects in the bilateral iliac arteries; however, she was asymptomatic from these with palpable dorsalis pedis pulses bilaterally.  These were therefore managed with anticoagulation.  She was last to follow-up.  Echo was performed during this admission and showed EF 60 to 65% with no RWMA, G1DD, mild LAE, trivial TR.   Since that time, the patient reports intermittent but ongoing chest pain episodes that are described as left-sided chest pain/pressure that occasionally radiates into her neck and back.  This chest pain reportedly occurred both at rest and with exertion, such as walking.  in the past, she has used sublingual nitro and Imdur for relief of these chest pain episodes; however, recently she has lost her insurance and is unable to refill her medications / difficulty with obtaining consistent employment/pay.  She continues to smoke. In addition, she has reportedly experienced intermittent numbness in her left hand, burning behind her left knee, and intermittent numbness of her central left back.    Last night, 02/28/19, she reports that she was unable to sleep very well due to her usual intermittent chest pain.  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  She reported left sided chest pain that was more acute than her previous episodes and radiated into her back.  Associated symptoms included shortness of breath and diaphoresis.  In the ED, she confirmed that she has been out of her medications, including her Eliquis and Brilinta as described above.  In addition, she has not had any nitro or Imdur (prescribed in the past) for CP relief.  Is performed and without acute abnormality.  CTA was without evidence of thoracic or abdominal aortic dissection or aneurysm.  Labs showed sodium 138, potassium 4.2, glucose 227,  creatinine 0.58, BUN 17, WBC 6.9, hemoglobin 13.4, RBC 4.26, platelets 172.  Negative for influenza A/B and COVID-19.  EKG without acute ST/T changes.  High-sensitivity troponin negative at  3  4. At the time of cardiology consult, she reported current mild left-sided chest pain (improved since presentation) but with more intense upper back pain.  She reported anxiety that her husband was not present to help communicate her medical history and past medications.  She was tearful on exam.  Recommendations as below.  Heart Pathway Score:     Past Medical History:  Diagnosis Date   Anxiety    Arthritis    "hips" (08/30/2014)   Asthma    CHF (congestive heart failure) (HCC)    Chronic lower back pain    COPD (chronic obstructive pulmonary disease) (HCC)    Coronary artery disease    07/23/16-STEMI PCI -->DES to RCA, Ef normal    Depression    DVT (deep venous thrombosis) (HCC)    GERD (gastroesophageal reflux disease)    Hypercholesterolemia    Hypertension    Migraine    "a few times/month" (08/30/2014)   NSTEMI (non-ST elevated myocardial infarction) (HCC) 08/2013   [name] 09/17/2013   Pneumonia "  several times"   Type II diabetes mellitus [facility])     Past Surgical History:  Procedure Laterality Date   CARDIAC CATHETERIZATION     CARDIAC CATHETERIZATION N/A 08/31/2014   Procedure: Left Heart Cath and Coronary Angiography;  Surgeon: Kathleene Hazel, MD;  Location: [facility] INVASIVE CV LAB;  Service: Cardiovascular;  Laterality: N/A;   CORONARY ANGIOPLASTY     CORONARY/GRAFT ACUTE MI REVASCULARIZATION N/A 07/23/2016   Procedure: Coronary/Graft Acute MI Revascularization;  Surgeon: Corky Crafts, MD;  Location: [facility] INVASIVE CV LAB;  Service: Cardiovascular;  Laterality: N/A;   LEFT HEART CATH AND CORONARY ANGIOGRAPHY N/A 07/23/2016   Procedure: Left Heart Cath and Coronary Angiography;  Surgeon: Corky Crafts, MD;  Location: [facility] INVASIVE CV LAB;  Service:  Cardiovascular;  Laterality: N/A;   PERCUTANEOUS PINNING PHALANX FRACTURE OF HAND Left    "plate & pins"   TONSILLECTOMY     UPPER EXTREMITY ANGIOGRAPHY Left 06/26/2017   Procedure: UPPER EXTREMITY ANGIOGRAPHY;  Surgeon: Nada Libman, MD;  Location: [facility] INVASIVE CV LAB;  Service: Cardiovascular;  Laterality: Left;  TPA administration single bolus     Home Medications:  Prior to Admission medications   Medication Sig Start Date End Date Taking? Authorizing Provider  nitroGLYCERIN (NITROSTAT) 0.4 MG SL tablet Place 0.4 mg under the tongue every 5 (five) minutes as needed for chest pain.   Yes [provider]    Inpatient Medications: Scheduled Meds:  aspirin  324 mg Oral NOW   Or   aspirin  300 mg Rectal NOW   [START ON 03/02/2019] aspirin EC  81 mg Oral Daily   atorvastatin  80 mg Oral q1800   metoprolol tartrate  12.5 mg Oral BID   Continuous Infusions:  PRN Meds: acetaminophen, morphine injection, nitroGLYCERIN, nitroGLYCERIN, ondansetron (ZOFRAN) IV  Allergies:    Allergies  Allergen Reactions   Hydrocodone-Acetaminophen Itching   Ibuprofen     "interacted with meds" per pt's family     Social History:   Social History   Socioeconomic History   Marital status: Married    Spouse name: Not on file   Number of children: Not on file   Years of education: Not on file   Highest education level: Not on file  Occupational History   Not on file  Tobacco Use   Smoking status: Current Every Day Smoker    Packs/day: 1.00    Years: 30.00    Pack years: 30.00    Types: Cigarettes   Smokeless tobacco: Never Used  Substance and Sexual Activity   Alcohol use: Yes    Comment: 08/30/2014 "I drink a couple times/month"   Drug use: No    Types: Marijuana    Comment: "back in my younger days"   Sexual activity: Yes  Other Topics Concern   Not on file  Social History Narrative   Not on file   Social Determinants of Health   Financial  Resource Strain:    Difficulty of Paying Living Expenses: Not on file  Food Insecurity:    Worried About Programme researcher, broadcasting/film/video in the Last Year: Not on file   [employer] of Food in the Last Year: Not on file  Transportation Needs:    Lack of Transportation (Medical): Not on file   Lack of Transportation (Non-Medical): Not on file  Physical Activity:    Days of Exercise per Week: Not on file   Minutes of Exercise per Session: Not on file  Stress:  Feeling of Stress : Not on file  Social Connections:    Frequency of Communication with Friends and Family: Not on file   Frequency of Social Gatherings with Friends and Family: Not on file   Attends Religious Services: Not on file   Active Member of Clubs or Organizations: Not on file   Attends Archivist Meetings: Not on file   Marital Status: Not on file  Intimate Partner Violence:    Fear of Current or Ex-Partner: Not on file   Emotionally Abused: Not on file   Physically Abused: Not on file   Sexually Abused: Not on file    Family History:    Family History  Problem Relation Age of Onset   Heart attack Mother    Heart attack Father      ROS:  Please see the history of present illness.  Review of Systems  Respiratory: Positive for shortness of breath.   Cardiovascular: Positive for chest pain. Negative for palpitations and leg swelling.  Gastrointestinal: Positive for nausea.  Musculoskeletal: Positive for back pain, joint pain and myalgias.  Neurological: Positive for sensory change.  All other systems reviewed and are negative.   All other ROS reviewed and negative.     Physical Exam/Data:   Vitals:   03/01/19 1345 03/01/19 1400 03/01/19 1430 03/01/19 1445  BP: 121/85 (!) 147/69 121/81   Pulse: 71  62 75  Resp: 15 14 11 17   Temp:      TempSrc:      SpO2: 97%  95% 98%  Weight:      Height:       No intake or output data in the 24 hours ending 03/01/19 1456 Last 3 Weights 03/01/2019  09/28/2017 06/26/2017  Weight (lbs) 140 lb 150 lb 160 lb 4.4 oz  Weight (kg) 63.504 kg 68.04 kg 72.7 kg     Body mass index is 24.03 kg/m.  General:  Well nourished, well developed, in no acute distress. Tearful. HEENT: normal Neck: no JVD Vascular: radial pulses 2+ on R, 1+ L Cardiac:  normal S1, S2; RRR; 2/6 systolic murmur Lungs:  B/l wheezing Abd: soft, nontender, no hepatomegaly  Ext: no edema Musculoskeletal:  No deformities, BUE and BLE strength normal and equal Skin: warm and dry  Neuro:  No focal abnormalities noted Psych:  Tearful and anxious at times  EKG:  The EKG was personally reviewed and demonstrates:  NSR, 86 bpm, prior inferior infarct, QTC 451 ms, poor conduction in lead I, nonspecific changes noted in the anterior lateral leads V2 through V4, V5/V6 Telemetry:  Telemetry was personally reviewed and demonstrates:  NSR 70-80s  Relevant CV Studies: Echo 06/26/2017 - Left ventricle: The cavity size was normal. Wall thickness was  normal. Systolic function was normal. The estimated ejection  fraction was in the range of 60% to 65%. Wall motion was normal;  there were no regional wall motion abnormalities. Doppler  parameters are consistent with abnormal left ventricular  relaxation (grade 1 diastolic dysfunction).  - Left atrium: The atrium was mildly dilated.  - Tricuspid valve: There was trivial regurgitation.   LHC 07/23/2016  Small, circumflex system. Diffusely diseased obtuse marginal vessels.  Mild LAD disease, but mid to distal vessel is quite small.  Dist LAD lesion, 30 %stenosed.  Prox LAD lesion, 30 %stenosed.  Mid RCA lesion, 99 %stenosed.  A STENT SYNERGY DES [id number] drug eluting stent was successfully placed, postdilated to 3.3 mm.  Post intervention, there is a 0% residual  stenosis.  The left ventricular ejection fraction is 35-45% by visual estimate, with inferior hypokinesis.  There is moderate left ventricular systolic  dysfunction.  LV end diastolic pressure is moderately elevated.  There is no aortic valve stenosis. Continue IV Angiomax until current bag runs out. She was loaded with Brilinta during the catheterization. She will need aggressive secondary prevention including smoking cessation and diabetes control.  Synergy drug-eluting stent was chosen since there've been some issues with compliance with her dual antiplatelet therapy in the past. Coronary Findings Diagnostic Dominance: Right Left Anterior Descending  Prox LAD lesion 30% stenosed  The lesion is segmental.  Dist LAD lesion 30% stenosed  The lesion is discrete.  Left Circumflex  Vessel is small.  Mid Cx lesion 40% stenosed  The lesion is segmental.  Mid Cx to Dist Cx lesion 40% stenosed  The lesion is segmental.  First Obtuse Marginal Branch  Vessel is small in size.  Second Obtuse Marginal Branch  Vessel is small in size.  2nd Mrg lesion 40% stenosed  The lesion is segmental.  Right Coronary Artery  Vessel is moderate in size.  Prox RCA lesion 40% stenosed  .  Prox RCA to Dist RCA lesion 30% stenosed  The lesion is segmental.  Mid RCA lesion 99% stenosed  The lesion is thrombotic.  Dist RCA lesion 50% stenosed  .  Right Posterior Descending Artery  Vessel is small in size.  RPDA lesion 30% stenosed  The lesion is segmental.  Intervention  Prox RCA to Dist RCA lesion  Angioplasty (Also treats lesions: Mid RCA)  Lesion crossed with guidewire using a WIRE ASAHI PROWATER 180CM. Pre-stent angioplasty was performed using a BALLOON SAPPHIRE 2.5X15. A STENT SYNERGY DES [id number] drug eluting stent was successfully placed. Stent strut is well apposed. Post-stent angioplasty was performed using a BALLOON [place] EMERGE MR 3.25X20. The pre-interventional distal flow is normal (TIMI 3). The post-interventional distal flow is normal (TIMI 3). The intervention was successful . No complications occurred at this lesion.  There is a 0% residual  stenosis post intervention.  Mid RCA lesion  Angioplasty (Also treats lesions: Prox RCA to Dist RCA)  Lesion crossed with guidewire using a WIRE ASAHI PROWATER 180CM. Pre-stent angioplasty was performed using a BALLOON SAPPHIRE 2.5X15. A STENT SYNERGY DES [id number] drug eluting stent was successfully placed. Stent strut is well apposed. Post-stent angioplasty was performed using a BALLOON [place] EMERGE MR 3.25X20. The pre-interventional distal flow is normal (TIMI 3). The post-interventional distal flow is normal (TIMI 3). The intervention was successful . No complications occurred at this lesion.  There is a 0% residual stenosis post intervention.  Wall Motion  Resting               Left Heart  Left Ventricle The left ventricular size is normal. There is moderate left ventricular systolic dysfunction. LV end diastolic pressure is moderately elevated. The left ventricular ejection fraction is 35-45% by visual estimate. There are LV function abnormalities due to segmental dysfunction.  Aortic Valve There is no aortic valve stenosis.  Coronary Diagrams  Diagnostic Dominance: Right  Intervention    LHC  08/31/2014 Conclusion  Prox RCA to Dist RCA lesion, 30% stenosed.  RPDA lesion, 30% stenosed.  2nd Mrg lesion, 40% stenosed.  Mid Cx to Dist Cx lesion, 40% stenosed.  Mid Cx lesion, 40% stenosed.  Dist LAD lesion, 30% stenosed.  Prox LAD lesion, 30% stenosed.  The left ventricular systolic function is normal. 1. Mild to moderate diffuse  triple vessel CAD with diabetic appearing vessels, no focal severe stenosis 2. Normal LV systolic function Recommendations: Continue medical therapy of CAD. Smoking cessation and risk factor modification.      Laboratory Data:  High Sensitivity Troponin:   Recent Labs  Lab 03/01/19 1008 03/01/19 1211  TROPONINIHS 3 4     Cardiac EnzymesNo results for input(s): TROPONINI in the last 168 hours. No results for input(s): TROPIPOC in the last  168 hours.  Chemistry Recent Labs  Lab 03/01/19 1008  NA 138  K 4.2  CL 105  CO2 24  GLUCOSE 227*  BUN 17  CREATININE 0.58  CALCIUM 9.1  GFRNONAA >60  GFRAA >60  ANIONGAP 9    No results for input(s): PROT, ALBUMIN, AST, ALT, ALKPHOS, BILITOT in the last 168 hours. Hematology Recent Labs  Lab 03/01/19 1008  WBC 6.9  RBC 4.26  HGB 13.4  HCT 39.2  MCV 92.0  MCH 31.5  MCHC 34.2  RDW 15.9*  PLT 172   BNPNo results for input(s): BNP, PROBNP in the last 168 hours.  DDimer No results for input(s): DDIMER in the last 168 hours.   Radiology/Studies:  DG Chest 2 View  Result Date: 03/01/2019 CLINICAL DATA:  Chest pain, pain in mid back and left arm. EXAM: CHEST - 2 VIEW COMPARISON:  06/24/2017 FINDINGS: Heart size and mediastinal contours are normal. Hilar structures are unremarkable. Lungs are clear. No signs of pleural effusion. Visualized skeletal structures are unremarkable. IMPRESSION: Normal exam. Electronically Signed   By: Donzetta Kohut M.D.   On: 03/01/2019 10:57   CT Angio Chest/Abd/Pel for Dissection W and/or W/WO  Result Date: 03/01/2019 CLINICAL DATA:  Chest and back pain. EXAM: CT ANGIOGRAPHY CHEST, ABDOMEN AND PELVIS TECHNIQUE: Multidetector CT imaging through the chest, abdomen and pelvis was performed using the standard protocol during bolus administration of intravenous contrast. Multiplanar reconstructed images and MIPs were obtained and reviewed to evaluate the vascular anatomy. CONTRAST:  101mL OMNIPAQUE IOHEXOL 350 MG/ML SOLN COMPARISON:  September 28, 2017. FINDINGS: CTA CHEST FINDINGS Cardiovascular: Preferential opacification of the thoracic aorta. No evidence of thoracic aortic aneurysm or dissection. Normal heart size. No pericardial effusion. Mediastinum/Nodes: No enlarged mediastinal, hilar, or axillary lymph nodes. Thyroid gland, trachea, and esophagus demonstrate no significant findings. Lungs/Pleura: Lungs are clear. No pleural effusion or pneumothorax.  Musculoskeletal: No chest wall abnormality. No acute or significant osseous findings. Review of the MIP images confirms the above findings. CTA ABDOMEN AND PELVIS FINDINGS VASCULAR Aorta: Normal caliber aorta without aneurysm, dissection, vasculitis or significant stenosis. Celiac: Patent without evidence of aneurysm, dissection, vasculitis or significant stenosis. SMA: Patent without evidence of aneurysm, dissection, vasculitis or significant stenosis. Renals: Both renal arteries are patent without evidence of aneurysm, dissection, vasculitis, fibromuscular dysplasia or significant stenosis. IMA: Patent without evidence of aneurysm, dissection, vasculitis or significant stenosis. Inflow: Patent without evidence of aneurysm, dissection, vasculitis or significant stenosis. Veins: No obvious venous abnormality within the limitations of this arterial phase study. Review of the MIP images confirms the above findings. NON-VASCULAR Hepatobiliary: No focal liver abnormality is seen. No gallstones, gallbladder wall thickening, or biliary dilatation. Pancreas: Unremarkable. No pancreatic ductal dilatation or surrounding inflammatory changes. Spleen: Normal in size without focal abnormality. Adrenals/Urinary Tract: Adrenal glands are unremarkable. Kidneys are normal, without renal calculi, focal lesion, or hydronephrosis. Bladder is unremarkable. Stomach/Bowel: Stomach is within normal limits. Appendix appears normal. No evidence of bowel wall thickening, distention, or inflammatory changes. Lymphatic: No significant adenopathy is noted. Reproductive: Uterus  and bilateral adnexa are unremarkable. Other: No abdominal wall hernia or abnormality. No abdominopelvic ascites. Musculoskeletal: No acute or significant osseous findings. Review of the MIP images confirms the above findings. IMPRESSION: 1. No evidence of thoracic or abdominal aortic dissection or aneurysm. 2. No definite abnormality seen in the chest, abdomen or pelvis.  Electronically Signed   By: Lupita Raider M.D.   On: 03/01/2019 11:47    Assessment and Plan:   Chest pain History of CAD with PCI  History of Inferior STEMI (2018)  Family History of CAD --Current chest pain, improved since presentation to [facility] ED.  Reports chest pain similar to that of her past NSTEMI/STEMI.  Occurs at rest, worse with walking.  Currently radiation of pain into her back is worse than the left sided chest pain.  She has a significant history of CAD and vascular dz, as well as strong family history. Still smoking. She has been out of her medications for some time, including DAPT and Eliquis, and due to financial barriers. --High-sensitivity troponin negative.  EKG without ST elevation.  No indication for emergent cardiac catheterization.  However, given her chest pain is similar to that of her previous STEMI/NSTEMI, as well as her personal and family risk factors for CAD, including current smoking and that she has not been on her DAPT, cannot completely rule out cardiac ischemia and further work-up recommended for further risk factor stratification.  Discussed further work-up with cardiac catheterization versus stress testing with agreement to proceed with stress testing tomorrow 03/02/2019.  Also considered was recent exposure to contrast from CTA with preference to avoid further contrast exposure with cath same day.   --Plan for NM Myoview tomorrow.  Echo ordered to reassess EF and rule out any acute structural changes or RWMA.  Pending labs for further risk factor modification, including hemoglobin A1c/lipid/TSH. Heart healthy carb modified diet (avoid caffeine) ordered until N.p.o. after midnight for Lexiscan.  Continue restarted ASA, BB, statin, SL nitro.  Daily BMET. Further recommendations s/p echo and NM Lexiscan. Will defer restart of Brilinta for now and pending results of echo/stress. As below, recommend addition of ACE before discharge due to HTN/DM2. Risk factor modification  recommended, including control of HLD, diabetes, and blood pressure. Given her financial difficulties, will need consult to care management prior to discharge to assist with outpatient follow-up and medications.   HTN  --Continue beta-blocker. Given comorbid HTN/DM2, recommend addition of ACEi prior to discharge per GDMT. Goal SBP <130.  HLD --Obtain updated lipid/LFTs.  Restarted statin.  As in HPI and in the past, she has needed additional support for lipid control (in addition to high intensity statin).  She will need follow-up lipid and liver function in 6 to 8 weeks.   DM2, poorly controlled --SSI, per IM.  Hyperglycemia at presentation. Update hemoglobin A1c, given she has likely not been taking her Metformin for some time.  Recommend tight glycemic control with consult to care management before discharge to ensure she is able to manage her DM2 after discharge.  Tobacco use --Smoking cessation advised.  Financial barriers Medication / Follow-up non-compliance --As above, she will need consult to care management before discharge to ensure that she is able to obtain outpatient follow-up and her discharge medications.  In the past, she has been lost to follow-up with intermittent compliance reported, likely secondary to financial difficulties.  Left hand numbness / L posterior knee burning, back numbness History of Vascular DZ s/p 06/2017 intraarterial TPA --As above, she has  a history of vascular disease and is s/p left hand intra-arterial TPA/nitro administration 06/2017.  Was prescribed Eliquis at discharge; however, she has been unable to afford this medication and is not currently taking it.  Consider further work-up of left hand numbness and LLE burning given history of vascular dz.        For questions or updates, please contact [facility] Please consult [email or web address] for contact info under     Signed, Lennon Alstrom, PA-C  03/01/2019 2:56 PM

## 2019-03-01 NOTE — Progress Notes (Signed)
CRITICAL VALUE ALERT  Critical Value:  APTT >160  Date & Time Notied:  03/01/2019 1817  Provider Notified: Irena Cords, MD   Orders Received/Actions taken: MD referred to Pharmacist who manages heparin drip. Pharmacist reviewing all info.

## 2019-03-02 ENCOUNTER — Other Ambulatory Visit: Payer: Self-pay

## 2019-03-02 DIAGNOSIS — I25119 Atherosclerotic heart disease of native coronary artery with unspecified angina pectoris: Secondary | ICD-10-CM

## 2019-03-02 DIAGNOSIS — I200 Unstable angina: Secondary | ICD-10-CM

## 2019-03-02 LAB — CBC WITH DIFFERENTIAL/PLATELET
Abs Immature Granulocytes: 0.1 10*3/uL — ABNORMAL HIGH (ref 0.00–0.07)
Basophils Absolute: 0.1 10*3/uL (ref 0.0–0.1)
Basophils Relative: 1 %
Eosinophils Absolute: 0.4 10*3/uL (ref 0.0–0.5)
Eosinophils Relative: 5 %
HCT: 36.9 % (ref 36.0–46.0)
Hemoglobin: 12.2 g/dL (ref 12.0–15.0)
Immature Granulocytes: 1 %
Lymphocytes Relative: 40 %
Lymphs Abs: 3.4 10*3/uL (ref 0.7–4.0)
MCH: 31 pg (ref 26.0–34.0)
MCHC: 33.1 g/dL (ref 30.0–36.0)
MCV: 93.7 fL (ref 80.0–100.0)
Monocytes Absolute: 0.7 10*3/uL (ref 0.1–1.0)
Monocytes Relative: 9 %
Neutro Abs: 3.8 10*3/uL (ref 1.7–7.7)
Neutrophils Relative %: 44 %
Platelets: 170 10*3/uL (ref 150–400)
RBC: 3.94 MIL/uL (ref 3.87–5.11)
RDW: 16.1 % — ABNORMAL HIGH (ref 11.5–15.5)
WBC: 8.6 10*3/uL (ref 4.0–10.5)
nRBC: 0 % (ref 0.0–0.2)

## 2019-03-02 LAB — TROPONIN I (HIGH SENSITIVITY): Troponin I (High Sensitivity): 3 ng/L (ref <18)

## 2019-03-02 LAB — LIPID PANEL
Cholesterol: 155 mg/dL (ref 0–200)
HDL: 35 mg/dL — ABNORMAL LOW (ref >40)
LDL Cholesterol: 76 mg/dL (ref 0–99)
Total CHOL/HDL Ratio: 4.4 RATIO
Triglycerides: 221 mg/dL — ABNORMAL HIGH (ref <150)
VLDL: 44 mg/dL — ABNORMAL HIGH (ref 0–40)

## 2019-03-02 LAB — COMPREHENSIVE METABOLIC PANEL
ALT: 22 U/L (ref 0–44)
AST: 15 U/L (ref 15–41)
Albumin: 3.7 g/dL (ref 3.5–5.0)
Alkaline Phosphatase: 62 U/L (ref 38–126)
Anion gap: 7 (ref 5–15)
BUN: 14 mg/dL (ref 6–20)
CO2: 25 mmol/L (ref 22–32)
Calcium: 8.6 mg/dL — ABNORMAL LOW (ref 8.9–10.3)
Chloride: 104 mmol/L (ref 98–111)
Creatinine, Ser: 0.5 mg/dL (ref 0.44–1.00)
GFR calc Af Amer: >60 mL/min (ref >60)
GFR calc non Af Amer: >60 mL/min (ref >60)
Glucose, Bld: 177 mg/dL — ABNORMAL HIGH (ref 70–99)
Potassium: 4.4 mmol/L (ref 3.5–5.1)
Sodium: 136 mmol/L (ref 135–145)
Total Bilirubin: 0.6 mg/dL (ref 0.3–1.2)
Total Protein: 6.6 g/dL (ref 6.5–8.1)

## 2019-03-02 LAB — GLUCOSE, CAPILLARY
Glucose-Capillary: 174 mg/dL — ABNORMAL HIGH (ref 70–99)
Glucose-Capillary: 154 mg/dL — ABNORMAL HIGH (ref 70–99)
Glucose-Capillary: 163 mg/dL — ABNORMAL HIGH (ref 70–99)
Glucose-Capillary: 199 mg/dL — ABNORMAL HIGH (ref 70–99)

## 2019-03-02 LAB — ECHOCARDIOGRAM COMPLETE
Height: 64 in
Weight: 2534.4 oz

## 2019-03-02 LAB — HEPARIN LEVEL (UNFRACTIONATED)
Heparin Unfractionated: 0.24 IU/mL — ABNORMAL LOW (ref 0.30–0.70)
Heparin Unfractionated: 0.21 IU/mL — ABNORMAL LOW (ref 0.30–0.70)
Heparin Unfractionated: 0.45 IU/mL (ref 0.30–0.70)

## 2019-03-02 LAB — PHOSPHORUS: Phosphorus: 3.3 mg/dL (ref 2.5–4.6)

## 2019-03-02 LAB — MAGNESIUM: Magnesium: 2 mg/dL (ref 1.7–2.4)

## 2019-03-02 MED ORDER — HEPARIN BOLUS VIA INFUSION
1500.0000 [IU] | Freq: Once | INTRAVENOUS | Status: AC
  Administered 2019-03-02: 1500 [IU] via INTRAVENOUS
  Filled 2019-03-02: qty 1500

## 2019-03-02 MED ORDER — ISOSORBIDE MONONITRATE ER 30 MG PO TB24
15.0000 mg | ORAL_TABLET | Freq: Every day | ORAL | Status: DC
  Administered 2019-03-02: 15 mg via ORAL
  Filled 2019-03-02: qty 1

## 2019-03-02 MED ORDER — HEPARIN BOLUS VIA INFUSION
1000.0000 [IU] | Freq: Once | INTRAVENOUS | Status: AC
  Administered 2019-03-02: 1000 [IU] via INTRAVENOUS
  Filled 2019-03-02: qty 1000

## 2019-03-02 NOTE — Progress Notes (Signed)
Patient resting peacefully, will continue to monitor to ensure comfort and safety.

## 2019-03-02 NOTE — Plan of Care (Signed)
  Problem: Pain Managment: Goal: General experience of comfort will improve Outcome: Progressing Patient administered prn medication to improve level of comfort.

## 2019-03-02 NOTE — Consult Note (Signed)
ANTICOAGULATION CONSULT NOTE - Initial Consult  Pharmacy Consult for Heparin Drip Indication: chest pain/ACS/unstable angina  Allergies  Allergen Reactions  . Hydrocodone-Acetaminophen Itching  . Ibuprofen     "interacted with meds" per pt's family     Patient Measurements: Height: 5\' 4"  (162.6 cm) Weight: 158 lb 6.4 oz (71.8 kg) IBW/kg (Calculated) : 54.7 Heparin Dosing Weight: 69.4kg  Vital Signs: Temp: 97.3 F (36.3 C) (02/10 1106) Temp Source: Oral (02/10 1106) BP: 112/74 (02/10 1106) Pulse Rate: 59 (02/10 1106)  Labs: Recent Labs    03/01/19 1008 03/01/19 1211 03/01/19 1529 03/01/19 1725 03/01/19 2258 03/02/19 0638 03/02/19 0920 03/02/19 1350  HGB 13.4  --   --   --   --  12.2  --   --   HCT 39.2  --   --   --   --  36.9  --   --   PLT 172  --   --   --   --  170  --   --   APTT  --   --   --  >160* 44*  --   --   --   LABPROT  --   --   --  14.8  --   --   --   --   INR  --   --   --  1.2  --   --   --   --   HEPARINUNFRC  --   --   --   --  0.18* 0.24*  --  0.21*  CREATININE 0.58  --   --   --   --  0.50  --   --   TROPONINIHS 3   < > 4 3  --   --  3  --    < > = values in this interval not displayed.    Estimated Creatinine Clearance: 84.5 mL/min (by C-G formula based on SCr of 0.5 mg/dL).   Medical History: Past Medical History:  Diagnosis Date  . Acute ST elevation myocardial infarction (STEMI) of inferior wall ([place])    2018 07/23/16-STEMI PCI -->DES to [facility]   . Anxiety   . Arthritis    "hips" (08/30/2014)  . Asthma   . CHF (congestive heart failure) ([place])   . Chronic lower back pain   . COPD (chronic obstructive pulmonary disease) ([place])   . Coronary artery disease    07/23/16-STEMI PCI -->DES to RCA, Ef normal   . Depression   . DVT (deep venous thrombosis) ([place])   . GERD (gastroesophageal reflux disease)   . Hypercholesterolemia   . Hypertension   . Migraine    "a few times/month" (08/30/2014)  . NSTEMI (non-ST elevated myocardial  infarction) ([place]) 08/2013   Archie Endo 09/17/2013  . Pneumonia "several times"  . Type II diabetes mellitus ([facility])     Medications:  No pta anticoagulant of record  Assessment: 49 y.o. female with a hx of moderate diffuse CAD with multiple catheterizations, hypertension, hyperlipidemia, DM2, current tobacco use, and who is being seen today for the evaluation of chest pain.  Pharmacy has been consulted to initiate and monitor a heparin drip for ACS/STEMI/unstable angina.  2/9 2258 HL 0.18 rebolus w/ heparin 2000 units IV x 1 and increase rate to 950 units/hr  2/10 0638 HL 0.24 rebolus heparin 1000 units, increase heparin rate to 1050 units/hr.  2/10 1350 HL 0.21 rebolus heparin 1500 units, increase heparin rate to 1200 units/hr.    Goal of  Therapy:  Heparin level 0.3-0.7 units/ml Monitor platelets by anticoagulation protocol: Yes   Plan:  Heparin level is subtherapeutic. Will bolus with 1500 units and increase heparin rate to 1200 units/hr. Will order heparin level in 6 hours. Daily CBC. CBC stable.   Ronnald Ramp, PharmD, BCPS Clinical Pharmacist 03/02/2019 2:32 PM

## 2019-03-02 NOTE — Progress Notes (Signed)
Notified by nuclear medicine the patient was sent down for her Myoview with active 8/10 chest pain. Cardiology was not notified of this at that time. Upon hearing of her active chest pain, I cancelled her Myoview, ordered a stat HS-Tn and EKG. Myoview has been cancelled for this morning.

## 2019-03-02 NOTE — Progress Notes (Signed)
PROGRESS NOTE    Krista Alvarez  ZOX:[id number] DOB: 25-Aug-1970 DOA: 03/01/2019 PCP: Clent Demark, PA-C      Assessment & Plan:   Principal Problem:   Unstable angina [facility]) Active Problems:   CAD (coronary artery disease)   Type 2 diabetes mellitus without complication ([place])   Tobacco abuse   Hypertension  CAD: will go for cardiac cath tomorrow as per cardiac cath. Continue on metoprolol, aspirin, imdur, statin. Continue on IV heparin drip. Continue on tele.   DM2: continue on SSI w/ accuchecks   Tobacco abuse: smoking cessation counseling.  HTN: continue on metoprolol  HLD: continue on statin   DVT prophylaxis: heparin drip Code Status: full  Family Communication:  Disposition Plan:   Consultants:   cardio   Procedures:    Antimicrobials:    Subjective: Pt c/o chest pain  Objective: Vitals:   03/01/19 1522 03/01/19 1957 03/02/19 0417 03/02/19 0745  BP: 134/89 123/76 115/83 111/88  Pulse: 70 65 69 65  Resp: 18 20 20 17   Temp:  98 F (36.7 C) 97.9 F (36.6 C) (!) 97.4 F (36.3 C)  TempSrc:  Oral Oral Oral  SpO2: 99% 98% 99% 99%  Weight:      Height:        Intake/Output Summary (Last 24 hours) at 03/02/2019 8119 Last data filed at 03/02/2019 1478 Gross per 24 hour  Intake 104.76 ml  Output 500 ml  Net -395.24 ml   Filed Weights   03/01/19 1007 03/01/19 1521  Weight: 63.5 kg 71.8 kg    Examination:  General exam: Appears calm and comfortable  Respiratory system: decreased breath sounds b/l. No rales  Cardiovascular system: S1 & S2 +. No rubs, gallops or clicks. Gastrointestinal system: Abdomen is nondistended, soft and nontender. Normal bowel sounds heard. Central nervous system: Alert and oriented. Moves all 4 extremities  Psychiatry: Judgement and insight appear normal. Mood & affect appropriate.     Data Reviewed: I have personally reviewed following labs and imaging studies  CBC: Recent Labs  Lab 03/01/19 1008  03/02/19 0638  WBC 6.9 8.6  NEUTROABS 3.4 3.8  HGB 13.4 12.2  HCT 39.2 36.9  MCV 92.0 93.7  PLT 172 295   Basic Metabolic Panel: Recent Labs  Lab 03/01/19 1008 03/01/19 1725 03/02/19 0638  NA 138  --  136  K 4.2  --  4.4  CL 105  --  104  CO2 24  --  25  GLUCOSE 227*  --  177*  BUN 17  --  14  CREATININE 0.58  --  0.50  CALCIUM 9.1  --  8.6*  MG  --  1.8 2.0  PHOS  --   --  3.3   GFR: Estimated Creatinine Clearance: 84.5 mL/min (by C-G formula based on SCr of 0.5 mg/dL). Liver Function Tests: Recent Labs  Lab 03/01/19 1725 03/02/19 0638  AST 16 15  ALT 24 22  ALKPHOS 74 62  BILITOT 0.9 0.6  PROT 7.0 6.6  ALBUMIN 4.2 3.7   No results for input(s): LIPASE, AMYLASE in the last 168 hours. No results for input(s): AMMONIA in the last 168 hours. Coagulation Profile: Recent Labs  Lab 03/01/19 1725  INR 1.2   Cardiac Enzymes: No results for input(s): CKTOTAL, CKMB, CKMBINDEX, TROPONINI in the last 168 hours. BNP (last 3 results) No results for input(s): PROBNP in the last 8760 hours. HbA1C: No results for input(s): HGBA1C in the last 72 hours. CBG: Recent Labs  Lab 03/01/19 1724 03/01/19 2135 03/02/19 0739  GLUCAP 168* 222* 174*   Lipid Profile: Recent Labs    03/02/19 0638  CHOL 155  HDL 35*  LDLCALC 76  TRIG 016*  CHOLHDL 4.4   Thyroid Function Tests: Recent Labs    03/01/19 1529  TSH 4.693*  FREET4 0.62   Anemia Panel: No results for input(s): VITAMINB12, FOLATE, FERRITIN, TIBC, IRON, RETICCTPCT in the last 72 hours. Sepsis Labs: No results for input(s): PROCALCITON, LATICACIDVEN in the last 168 hours.  Recent Results (from the past 240 hour(s))  Respiratory Panel by RT PCR (Flu A&B, Covid) - Nasopharyngeal Swab     Status: None   Collection Time: 03/01/19 10:32 AM   Specimen: Nasopharyngeal Swab  Result Value Ref Range Status   SARS Coronavirus 2 by RT PCR NEGATIVE NEGATIVE Final    Comment: (NOTE) SARS-CoV-2 target nucleic acids  are NOT DETECTED. The SARS-CoV-2 RNA is generally detectable in upper respiratoy specimens during the acute phase of infection. The lowest concentration of SARS-CoV-2 viral copies this assay can detect is 131 copies/mL. A negative result does not preclude SARS-Cov-2 infection and should not be used as the sole basis for treatment or other patient management decisions. A negative result may occur with  improper specimen collection/handling, submission of specimen other than nasopharyngeal swab, presence of viral mutation(s) within the areas targeted by this assay, and inadequate number of viral copies (<131 copies/mL). A negative result must be combined with clinical observations, patient history, and epidemiological information. The expected result is Negative. Fact Sheet for Patients:  https://www.moore.com/ Fact Sheet for Healthcare Providers:  https://www.young.biz/ This test is not yet ap proved or cleared by the [place] FDA and  has been authorized for detection and/or diagnosis of SARS-CoV-2 by FDA under an Emergency Use Authorization (EUA). This EUA will remain  in effect (meaning this test can be used) for the duration of the COVID-19 declaration under Section 564(b)(1) of the Act, 21 U.S.C. section 360bbb-3(b)(1), unless the authorization is terminated or revoked sooner.    Influenza A by PCR NEGATIVE NEGATIVE Final   Influenza B by PCR NEGATIVE NEGATIVE Final    Comment: (NOTE) The Xpert Xpress SARS-CoV-2/FLU/RSV assay is intended as an aid in  the diagnosis of influenza from Nasopharyngeal swab specimens and  should not be used as a sole basis for treatment. Nasal washings and  aspirates are unacceptable for Xpert Xpress SARS-CoV-2/FLU/RSV  testing. Fact Sheet for Patients: https://www.moore.com/ Fact Sheet for Healthcare Providers: https://www.young.biz/ This test is not yet approved or  cleared by the [place] FDA and  has been authorized for detection and/or diagnosis of SARS-CoV-2 by  FDA under an Emergency Use Authorization (EUA). This EUA will remain  in effect (meaning this test can be used) for the duration of the  Covid-19 declaration under Section 564(b)(1) of the Act, 21  U.S.C. section 360bbb-3(b)(1), unless the authorization is  terminated or revoked. Performed at [facility], [address] [place], [place] [address]          Radiology Studies: DG Chest 2 View  Result Date: 03/01/2019 CLINICAL DATA:  Chest pain, pain in mid back and left arm. EXAM: CHEST - 2 VIEW COMPARISON:  06/24/2017 FINDINGS: Heart size and mediastinal contours are normal. Hilar structures are unremarkable. Lungs are clear. No signs of pleural effusion. Visualized skeletal structures are unremarkable. IMPRESSION: Normal exam. Electronically Signed   By: Donzetta Kohut M.D.   On: 03/01/2019 10:57   ECHOCARDIOGRAM COMPLETE  Result Date: 03/02/2019    ECHOCARDIOGRAM REPORT   Patient Name:   Krista JENNETTE Date of Exam: 03/01/2019 Medical Rec #:  [id number]        Height:       64.0 in Accession #:    [id number]       Weight:       158.4 lb Date of Birth:  1970-04-08        BSA:          1.77 m Patient Age:    48 years         BP:           134/89 mmHg Patient Gender: F                HR:           64 bpm. Exam Location:  [facility] Procedure: 2D Echo, 3D Echo, Cardiac Doppler, Color Doppler and Strain Analysis Indications:     R07.9 Chest pain  History:         Patient has prior history of Echocardiogram examinations, most                  recent 06/26/2017. Risk Factors:Hypertension, Dyslipidemia and                  Diabetes. Pneumonia. NSTEMI. Migraines. DVT. Coronary artery                  disease. COPD. Congestive heart failure.  Sonographer:     Sedonia Small Rodgers-Jones Referring Phys:  [id number] Lennon Alstrom Diagnosing Phys: Yvonne Kendall MD IMPRESSIONS  1. Left ventricular  ejection fraction, by estimation, is 55 to 60%. The left ventricle has normal function. The left ventrical demonstrates regional wall motion abnormalities (see scoring diagram/findings for description). Left ventricular diastolic parameters were normal. There is mild hypokinesis of the left ventricular, basal-mid inferior wall. The average left ventricular global longitudinal strain is -20.8 %.  2. Right ventricular systolic function is normal. The right ventricular size is normal. Tricuspid regurgitation signal is inadequate for assessing PA pressure.  3. The mitral valve is degenerative. Mild mitral valve regurgitation.  4. The aortic valve was not well visualized. Aortic valve regurgitation is not visualized. No aortic stenosis is present.  5. The inferior vena cava is normal in size with <50% respiratory variability, suggesting right atrial pressure of 8 mmHg. FINDINGS  Left Ventricle: Left ventricular ejection fraction, by estimation, is 55 to 60%. The left ventricle has normal function. The left ventricle demonstrates regional wall motion abnormalities. Mild hypokinesis of the left ventricular, basal-mid inferior wall. The average left ventricular global longitudinal strain is -20.8 %. The left ventricular internal cavity size was normal in size. There is no left ventricular hypertrophy. Left ventricular diastolic parameters were normal. Right Ventricle: The right ventricular size is normal. No increase in right ventricular wall thickness. Right ventricular systolic function is normal. Tricuspid regurgitation signal is inadequate for assessing PA pressure. Left Atrium: Left atrial size was normal in size. Right Atrium: Right atrial size was normal in size. Pericardium: There is no evidence of pericardial effusion. Mitral Valve: The mitral valve is degenerative in appearance. There is mild thickening of the mitral valve leaflet(s). Mild mitral valve regurgitation. No evidence of mitral valve stenosis. Tricuspid  Valve: The tricuspid valve is not well visualized. Tricuspid valve regurgitation is not demonstrated. Aortic Valve: The aortic valve was not well visualized. Aortic valve regurgitation is not visualized. No aortic stenosis is present.  Pulmonic Valve: The pulmonic valve was not well visualized. Pulmonic valve regurgitation is not visualized. No evidence of pulmonic stenosis. Aorta: The aortic root is normal in size and structure. Pulmonary Artery: The pulmonary artery is not well seen. Venous: The inferior vena cava is normal in size with less than 50% respiratory variability, suggesting right atrial pressure of 8 mmHg. IAS/Shunts: The interatrial septum was not well visualized.  LEFT VENTRICLE PLAX 2D LVIDd:         4.86 cm  Diastology LVIDs:         3.75 cm  LV e' lateral:   9.46 cm/s LV PW:         0.73 cm  LV E/e' lateral: 9.3 LV IVS:        0.78 cm  LV e' medial:    8.49 cm/s LVOT diam:     1.90 cm  LV E/e' medial:  10.4 LV SV:         55.00 ml LV SV Index:   27.85    2D Longitudinal Strain LVOT Area:     2.84 cm 2D Strain GLS (A2C):   -20.8 %                         2D Strain GLS (A3C):   -16.9 %                         2D Strain GLS (A4C):   -24.8 %                         2D Strain GLS Avg:     -20.8 %                          3D Volume EF:                         3D EF:        56 %                         LV EDV:       146 ml                         LV ESV:       64 ml                         LV SV:        82 ml RIGHT VENTRICLE RV Basal diam:  2.96 cm RV S prime:     13.20 cm/s TAPSE (M-mode): 2.7 cm LEFT ATRIUM             Index       RIGHT ATRIUM          Index LA diam:        3.70 cm 2.09 cm/m  RA Area:     8.69 cm LA Vol (A2C):   30.0 ml 16.93 ml/m RA Volume:   18.80 ml 10.61 ml/m LA Vol (A4C):   34.9 ml 19.70 ml/m LA Biplane Vol: 33.3 ml 18.79 ml/m  AORTIC VALVE LVOT Vmax:   87.50 cm/s LVOT Vmean:  64.200 cm/s LVOT VTI:    0.194 m  AORTA Ao Root diam: 3.00 cm MITRAL VALVE  MV Area (PHT): 3.37 cm              SHUNTS MV Decel Time: 225 msec             Systemic VTI:  0.19 m MV E velocity: 88.10 cm/s 103 cm/s  Systemic Diam: 1.90 cm MV A velocity: 89.20 cm/s 70.3 cm/s MV E/A ratio:  0.99       1.5 Yvonne Kendall MD Electronically signed by Yvonne Kendall MD Signature Date/Time: 03/02/2019/6:52:23 AM    Final    CT Angio Chest/Abd/Pel for Dissection W and/or W/WO  Result Date: 03/01/2019 CLINICAL DATA:  Chest and back pain. EXAM: CT ANGIOGRAPHY CHEST, ABDOMEN AND PELVIS TECHNIQUE: Multidetector CT imaging through the chest, abdomen and pelvis was performed using the standard protocol during bolus administration of intravenous contrast. Multiplanar reconstructed images and MIPs were obtained and reviewed to evaluate the vascular anatomy. CONTRAST:  28mL OMNIPAQUE IOHEXOL 350 MG/ML SOLN COMPARISON:  September 28, 2017. FINDINGS: CTA CHEST FINDINGS Cardiovascular: Preferential opacification of the thoracic aorta. No evidence of thoracic aortic aneurysm or dissection. Normal heart size. No pericardial effusion. Mediastinum/Nodes: No enlarged mediastinal, hilar, or axillary lymph nodes. Thyroid gland, trachea, and esophagus demonstrate no significant findings. Lungs/Pleura: Lungs are clear. No pleural effusion or pneumothorax. Musculoskeletal: No chest wall abnormality. No acute or significant osseous findings. Review of the MIP images confirms the above findings. CTA ABDOMEN AND PELVIS FINDINGS VASCULAR Aorta: Normal caliber aorta without aneurysm, dissection, vasculitis or significant stenosis. Celiac: Patent without evidence of aneurysm, dissection, vasculitis or significant stenosis. SMA: Patent without evidence of aneurysm, dissection, vasculitis or significant stenosis. Renals: Both renal arteries are patent without evidence of aneurysm, dissection, vasculitis, fibromuscular dysplasia or significant stenosis. IMA: Patent without evidence of aneurysm, dissection, vasculitis or significant stenosis. Inflow:  Patent without evidence of aneurysm, dissection, vasculitis or significant stenosis. Veins: No obvious venous abnormality within the limitations of this arterial phase study. Review of the MIP images confirms the above findings. NON-VASCULAR Hepatobiliary: No focal liver abnormality is seen. No gallstones, gallbladder wall thickening, or biliary dilatation. Pancreas: Unremarkable. No pancreatic ductal dilatation or surrounding inflammatory changes. Spleen: Normal in size without focal abnormality. Adrenals/Urinary Tract: Adrenal glands are unremarkable. Kidneys are normal, without renal calculi, focal lesion, or hydronephrosis. Bladder is unremarkable. Stomach/Bowel: Stomach is within normal limits. Appendix appears normal. No evidence of bowel wall thickening, distention, or inflammatory changes. Lymphatic: No significant adenopathy is noted. Reproductive: Uterus and bilateral adnexa are unremarkable. Other: No abdominal wall hernia or abnormality. No abdominopelvic ascites. Musculoskeletal: No acute or significant osseous findings. Review of the MIP images confirms the above findings. IMPRESSION: 1. No evidence of thoracic or abdominal aortic dissection or aneurysm. 2. No definite abnormality seen in the chest, abdomen or pelvis. Electronically Signed   By: Lupita Raider M.D.   On: 03/01/2019 11:47        Scheduled Meds: . aspirin EC  81 mg Oral Daily  . atorvastatin  80 mg Oral q1800  . insulin aspart  0-9 Units Subcutaneous TID WC  . metoprolol tartrate  12.5 mg Oral BID   Continuous Infusions: . heparin 1,050 Units/hr (03/02/19 0738)     LOS: 1 day    Time spent: 30 mins    Charise Killian, MD [facility] Pager [phone]  If 7PM-7AM, please contact night-coverage [email or web address] 03/02/2019, 8:22 AM

## 2019-03-02 NOTE — Progress Notes (Signed)
Progress Note  Patient Name: Krista Alvarez Date of Encounter: 03/02/2019  Primary Cardiologist: new to [facility] - consult by End  Subjective   She was initially scheduled for a Lexiscan MPI this morning. This was cancelled in nuc medicine, prior to dosing, when she notified staff she had been having 8/10 chest pain all morning. Stat troponin was repeated and unchanged at 3. Stat EKG showed NSR, 61 bpm, inferior Q waves and no acute changes. The study is unchanged from prior. She was given IV morphine which has improved her pain to a 5/10 currently.    Inpatient Medications    Scheduled Meds: . aspirin EC  81 mg Oral Daily  . atorvastatin  80 mg Oral q1800  . insulin aspart  0-9 Units Subcutaneous TID WC  . metoprolol tartrate  12.5 mg Oral BID   Continuous Infusions: . heparin 1,050 Units/hr (03/02/19 0738)   PRN Meds: acetaminophen, morphine injection, nitroGLYCERIN, ondansetron (ZOFRAN) IV   Vital Signs    Vitals:   03/01/19 1522 03/01/19 1957 03/02/19 0417 03/02/19 0745  BP: 134/89 123/76 115/83 111/88  Pulse: 70 65 69 65  Resp: 18 20 20 17   Temp:  98 F (36.7 C) 97.9 F (36.6 C) (!) 97.4 F (36.3 C)  TempSrc:  Oral Oral Oral  SpO2: 99% 98% 99% 99%  Weight:      Height:        Intake/Output Summary (Last 24 hours) at 03/02/2019 1001 Last data filed at 03/02/2019 04/30/2019 Gross per 24 hour  Intake 104.76 ml  Output 500 ml  Net -395.24 ml   Filed Weights   03/01/19 1007 03/01/19 1521  Weight: 63.5 kg 71.8 kg    Telemetry    SR with rates ranging from the upper 40s to 70s bpm - Personally Reviewed  ECG    NSR, 61 bpm, inferior Q waves and no acute changes, unchanged from prior - Personally Reviewed  Physical Exam   GEN: No acute distress. Resting in bed upon entering room.  Neck: No JVD. Cardiac: RRR, no murmurs, rubs, or gallops.  Respiratory: Clear to auscultation bilaterally.  GI: Soft, nontender, non-distended.   MS: No edema; No  deformity. Neuro:  Alert and oriented x 3; Nonfocal.  Psych: Normal affect.  Labs    Chemistry Recent Labs  Lab 03/01/19 1008 03/01/19 1725 03/02/19 0638  NA 138  --  136  K 4.2  --  4.4  CL 105  --  104  CO2 24  --  25  GLUCOSE 227*  --  177*  BUN 17  --  14  CREATININE 0.58  --  0.50  CALCIUM 9.1  --  8.6*  PROT  --  7.0 6.6  ALBUMIN  --  4.2 3.7  AST  --  16 15  ALT  --  24 22  ALKPHOS  --  74 62  BILITOT  --  0.9 0.6  GFRNONAA >60  --  >60  GFRAA >60  --  >60  ANIONGAP 9  --  7     Hematology Recent Labs  Lab 03/01/19 1008 03/02/19 0638  WBC 6.9 8.6  RBC 4.26 3.94  HGB 13.4 12.2  HCT 39.2 36.9  MCV 92.0 93.7  MCH 31.5 31.0  MCHC 34.2 33.1  RDW 15.9* 16.1*  PLT 172 170    Cardiac EnzymesNo results for input(s): TROPONINI in the last 168 hours. No results for input(s): TROPIPOC in the last 168 hours.  BNPNo results for input(s): BNP, PROBNP in the last 168 hours.   DDimer No results for input(s): DDIMER in the last 168 hours.   Radiology    DG Chest 2 View  Result Date: 03/01/2019 IMPRESSION: Normal exam. Electronically Signed   By: Zetta Bills M.D.   On: 03/01/2019 10:57    CT Angio Chest/Abd/Pel for Dissection W and/or W/WO  Result Date: 03/01/2019 IMPRESSION: 1. No evidence of thoracic or abdominal aortic dissection or aneurysm. 2. No definite abnormality seen in the chest, abdomen or pelvis. Electronically Signed   By: Marijo Conception M.D.   On: 03/01/2019 11:47    Cardiac Studies   2D echo 03/01/2019: 1. Left ventricular ejection fraction, by estimation, is 55 to 60%. The  left ventricle has normal function. The left ventrical demonstrates  regional wall motion abnormalities (see scoring diagram/findings for  description). Left ventricular diastolic  parameters were normal. There is mild hypokinesis of the left ventricular,  basal-mid inferior wall. The average left ventricular global longitudinal  strain is -20.8 %.  2. Right  ventricular systolic function is normal. The right ventricular  size is normal. Tricuspid regurgitation signal is inadequate for assessing  PA pressure.  3. The mitral valve is degenerative. Mild mitral valve regurgitation.  4. The aortic valve was not well visualized. Aortic valve regurgitation  is not visualized. No aortic stenosis is present.  5. The inferior vena cava is normal in size with <50% respiratory  variability, suggesting right atrial pressure of 8 mmHg.  __________  LHC 07/2016:  Small, circumflex system. Diffusely diseased obtuse marginal vessels.  Mild LAD disease, but mid to distal vessel is quite small.  Dist LAD lesion, 30 %stenosed.  Prox LAD lesion, 30 %stenosed.  Mid RCA lesion, 99 %stenosed.  A STENT SYNERGY DES [id number] drug eluting stent was successfully placed, postdilated to 3.3 mm.  Post intervention, there is a 0% residual stenosis.  The left ventricular ejection fraction is 35-45% by visual estimate, with inferior hypokinesis.  There is moderate left ventricular systolic dysfunction.  LV end diastolic pressure is moderately elevated.  There is no aortic valve stenosis.   Continue IV Angiomax until current bag runs out. She was loaded with Brilinta during the catheterization.  She will need aggressive secondary prevention including smoking cessation and diabetes control.   Synergy drug-eluting stent was chosen since there've been some issues with compliance with her dual antiplatelet therapy in the past.  Patient Profile     49 y.o. female with history of CAD with inferior STEMI in 07/2017 s/p PCI/DES to the RCA, peripheral vascular disease status post emboli to bilateral iliac arteries and left hand, DM2, HTN, HLD, ongoing tobacco use, and medication noncompliance due to financial barriers, whom we are seeing for chest pain.   Assessment & Plan    1. CAD involving the native coronary arteries with prior inferior STEMI in 07/2016 presenting  with unstable angina: -Currently, noted improved, 5/10 chest pain -Initially, she was scheduled for a Lexiscan MPI this morning. However, she complained of 8/10 chest pain leading her MPI to be cancelled. Stat HS-Tn was unchanged at 3 with stat EKG also being unchanged -Continue heparin gtt -ASA -Lipitor -Metoprolol -PRN morphine and SL NTG -Add Imdur 15 mg daily -NPO at midnight -Plan for [facility] 12/31/2019 -Risks and benefits of cardiac catheterization have been discussed with the patient including risks of bleeding, bruising, infection, kidney damage, stroke, heart attack, urgent need for bypass, injury to a limb, and death.  The patient understands these risks and is willing to proceed with the procedure. All questions have been answered and concerns listened to  2. HTN: -Blood pressure well controlled -Continue current therapy   3. HLD: -LDL of 76 this admission with goal LDL < 70 -Lipitor restarted this admission -Recheck FLP and LFT in ~ 8 weeks, if LDL remains above goal at that time, in the setting of medication compliance, would add Zetia 10 mg   4. Tobacco use: -Cessation advised   For questions or updates, please contact [facility] Please consult [email or web address] for contact info under Cardiology/STEMI.    Signed, Eula Listen, PA-C [facility] Pager: [phone] 03/02/2019, 10:01 AM

## 2019-03-02 NOTE — Consult Note (Signed)
ANTICOAGULATION CONSULT NOTE - Initial Consult  Pharmacy Consult for Heparin Drip Indication: chest pain/ACS/unstable angina  Allergies  Allergen Reactions  . Hydrocodone-Acetaminophen Itching  . Ibuprofen     "interacted with meds" per pt's family     Patient Measurements: Height: 5\' 4"  (162.6 cm) Weight: 158 lb 6.4 oz (71.8 kg) IBW/kg (Calculated) : 54.7 Heparin Dosing Weight: 69.4kg  Vital Signs: Temp: 97.9 F (36.6 C) (02/10 0417) Temp Source: Oral (02/10 0417) BP: 115/83 (02/10 0417) Pulse Rate: 69 (02/10 0417)  Labs: Recent Labs    03/01/19 1008 03/01/19 1008 03/01/19 1211 03/01/19 1529 03/01/19 1725 03/01/19 2258 03/02/19 0638  HGB 13.4  --   --   --   --   --  12.2  HCT 39.2  --   --   --   --   --  36.9  PLT 172  --   --   --   --   --  170  APTT  --   --   --   --  >160* 44*  --   LABPROT  --   --   --   --  14.8  --   --   INR  --   --   --   --  1.2  --   --   HEPARINUNFRC  --   --   --   --   --  0.18* 0.24*  CREATININE 0.58  --   --   --   --   --  0.50  TROPONINIHS 3   < > 4 4 3   --   --    < > = values in this interval not displayed.    Estimated Creatinine Clearance: 84.5 mL/min (by C-G formula based on SCr of 0.5 mg/dL).   Medical History: Past Medical History:  Diagnosis Date  . Acute ST elevation myocardial infarction (STEMI) of inferior wall ([place])    2018 07/23/16-STEMI PCI -->DES to [facility]   . Anxiety   . Arthritis    "hips" (08/30/2014)  . Asthma   . CHF (congestive heart failure) ([place])   . Chronic lower back pain   . COPD (chronic obstructive pulmonary disease) ([place])   . Coronary artery disease    07/23/16-STEMI PCI -->DES to RCA, Ef normal   . Depression   . DVT (deep venous thrombosis) ([place])   . GERD (gastroesophageal reflux disease)   . Hypercholesterolemia   . Hypertension   . Migraine    "a few times/month" (08/30/2014)  . NSTEMI (non-ST elevated myocardial infarction) ([place]) 08/2013   Archie Endo 09/17/2013  . Pneumonia "several  times"  . Type II diabetes mellitus (HCC)     Medications:  No pta anticoagulant of record  Assessment: 49 y.o. female with a hx of moderate diffuse CAD with multiple catheterizations, hypertension, hyperlipidemia, DM2, current tobacco use, and who is being seen today for the evaluation of chest pain.  Pharmacy has been consulted to initiate and monitor a heparin drip for ACS/STEMI/unstable angina.  2/9 2258 HL 0.18 rebolus w/ heparin 2000 units IV x 1 and increase rate to 950 units/hr  2/10 0638 HL 0.24 rebolus heparin 1000 units, increase heparin rate to 1050 units/hr.   Goal of Therapy:  Heparin level 0.3-0.7 units/ml Monitor platelets by anticoagulation protocol: Yes   Plan:  Heparin level is subtherapeutic. Will bolus with 1000 units and increase heparin rate to 1050 units/hr. Will order heparin level in 6 hours. Daily CBC. CBC stable.  Ronnald Ramp, PharmD, BCPS Clinical Pharmacist 03/02/2019 7:27 AM

## 2019-03-02 NOTE — Consult Note (Signed)
ANTICOAGULATION CONSULT NOTE - Initial Consult  Pharmacy Consult for Heparin Drip Indication: chest pain/ACS/unstable angina  Allergies  Allergen Reactions  . Hydrocodone-Acetaminophen Itching  . Ibuprofen     "interacted with meds" per pt's family     Patient Measurements: Height: 5\' 4"  (162.6 cm) Weight: 158 lb 6.4 oz (71.8 kg) IBW/kg (Calculated) : 54.7 Heparin Dosing Weight: 69.4kg  Vital Signs: Temp: 98.1 F (36.7 C) (02/10 1942) Temp Source: Oral (02/10 1942) BP: 108/77 (02/10 1942) Pulse Rate: 65 (02/10 1942)  Labs: Recent Labs    03/01/19 1008 03/01/19 1211 03/01/19 1529 03/01/19 1725 03/01/19 2258 03/01/19 2258 03/02/19 04/30/19 03/02/19 0920 03/02/19 1350 03/02/19 2207  HGB 13.4  --   --   --   --   --  12.2  --   --   --   HCT 39.2  --   --   --   --   --  36.9  --   --   --   PLT 172  --   --   --   --   --  170  --   --   --   APTT  --   --   --  >160* 44*  --   --   --   --   --   LABPROT  --   --   --  14.8  --   --   --   --   --   --   INR  --   --   --  1.2  --   --   --   --   --   --   HEPARINUNFRC  --   --   --   --  0.18*   < > 0.24*  --  0.21* 0.45  CREATININE 0.58  --   --   --   --   --  0.50  --   --   --   TROPONINIHS 3   < > 4 3  --   --   --  3  --   --    < > = values in this interval not displayed.    Estimated Creatinine Clearance: 84.5 mL/min (by C-G formula based on SCr of 0.5 mg/dL).   Medical History: Past Medical History:  Diagnosis Date  . Acute ST elevation myocardial infarction (STEMI) of inferior wall (HCC)    2018 07/23/16-STEMI PCI -->DES to [facility]   . Anxiety   . Arthritis    "hips" (08/30/2014)  . Asthma   . CHF (congestive heart failure) (HCC)   . Chronic lower back pain   . COPD (chronic obstructive pulmonary disease) (HCC)   . Coronary artery disease    07/23/16-STEMI PCI -->DES to RCA, Ef normal   . Depression   . DVT (deep venous thrombosis) (HCC)   . GERD (gastroesophageal reflux disease)   .  Hypercholesterolemia   . Hypertension   . Migraine    "a few times/month" (08/30/2014)  . NSTEMI (non-ST elevated myocardial infarction) (HCC) 08/2013   09/2013 09/17/2013  . Pneumonia "several times"  . Type II diabetes mellitus (HCC)     Medications:  No pta anticoagulant of record  Assessment: 49 y.o. female with a hx of moderate diffuse CAD with multiple catheterizations, hypertension, hyperlipidemia, DM2, current tobacco use, and who is being seen today for the evaluation of chest pain.  Pharmacy has been consulted to initiate and monitor a heparin drip  for ACS/STEMI/unstable angina.  2/9 2258 HL 0.18 rebolus w/ heparin 2000 units IV x 1 and increase rate to 950 units/hr  2/10 0638 HL 0.24 rebolus heparin 1000 units, increase heparin rate to 1050 units/hr.  2/10 1350 HL 0.21 rebolus heparin 1500 units, increase heparin rate to 1200 units/hr.    Goal of Therapy:  Heparin level 0.3-0.7 units/ml Monitor platelets by anticoagulation protocol: Yes   Plan:  02/10 @ 2200 HL 0.45 therapeutic. Will continue current rate and will recheck HL w/ am labs.  Tobie Lords, PharmD, BCPS Clinical Pharmacist 03/02/2019 11:33 PM

## 2019-03-03 ENCOUNTER — Encounter
Admission: EM | Disposition: A | Discharge status: Home / Self Care | Payer: Self-pay | Source: Home / Self Care | Attending: Internal Medicine

## 2019-03-03 ENCOUNTER — Encounter: Payer: Self-pay | Admitting: Internal Medicine

## 2019-03-03 DIAGNOSIS — E118 Type 2 diabetes mellitus with unspecified complications: Secondary | ICD-10-CM

## 2019-03-03 HISTORY — PX: LEFT HEART CATH AND CORONARY ANGIOGRAPHY: CATH118249

## 2019-03-03 LAB — CBC
HCT: 35.2 % — ABNORMAL LOW (ref 36.0–46.0)
Hemoglobin: 11.9 g/dL — ABNORMAL LOW (ref 12.0–15.0)
MCH: 31.3 pg (ref 26.0–34.0)
MCHC: 33.8 g/dL (ref 30.0–36.0)
MCV: 92.6 fL (ref 80.0–100.0)
Platelets: 145 10*3/uL — ABNORMAL LOW (ref 150–400)
RBC: 3.8 MIL/uL — ABNORMAL LOW (ref 3.87–5.11)
RDW: 15.9 % — ABNORMAL HIGH (ref 11.5–15.5)
WBC: 9.4 10*3/uL (ref 4.0–10.5)
nRBC: 0 % (ref 0.0–0.2)

## 2019-03-03 LAB — HEMOGLOBIN A1C
Hgb A1c MFr Bld: 7.3 % — ABNORMAL HIGH (ref 4.8–5.6)
Hgb A1c MFr Bld: 7.1 % — ABNORMAL HIGH (ref 4.8–5.6)
Mean Plasma Glucose: 163 mg/dL
Mean Plasma Glucose: 157 mg/dL

## 2019-03-03 LAB — BASIC METABOLIC PANEL
Anion gap: 8 (ref 5–15)
BUN: 18 mg/dL (ref 6–20)
CO2: 25 mmol/L (ref 22–32)
Calcium: 8.8 mg/dL — ABNORMAL LOW (ref 8.9–10.3)
Chloride: 103 mmol/L (ref 98–111)
Creatinine, Ser: 0.41 mg/dL — ABNORMAL LOW (ref 0.44–1.00)
GFR calc Af Amer: >60 mL/min (ref >60)
GFR calc non Af Amer: >60 mL/min (ref >60)
Glucose, Bld: 214 mg/dL — ABNORMAL HIGH (ref 70–99)
Potassium: 4.4 mmol/L (ref 3.5–5.1)
Sodium: 136 mmol/L (ref 135–145)

## 2019-03-03 LAB — GLUCOSE, CAPILLARY
Glucose-Capillary: 198 mg/dL — ABNORMAL HIGH (ref 70–99)
Glucose-Capillary: 154 mg/dL — ABNORMAL HIGH (ref 70–99)
Glucose-Capillary: 135 mg/dL — ABNORMAL HIGH (ref 70–99)

## 2019-03-03 LAB — HEPARIN LEVEL (UNFRACTIONATED): Heparin Unfractionated: 0.43 IU/mL (ref 0.30–0.70)

## 2019-03-03 SURGERY — LEFT HEART CATH AND CORONARY ANGIOGRAPHY
Anesthesia: Moderate Sedation

## 2019-03-03 MED ORDER — HYDRALAZINE HCL 20 MG/ML IJ SOLN: 10.0000 mg | INTRAMUSCULAR | Status: AC | PRN

## 2019-03-03 MED ORDER — SODIUM CHLORIDE 0.9% FLUSH: 3.0000 mL | Freq: Two times a day (BID) | INTRAVENOUS | Status: DC

## 2019-03-03 MED ORDER — HEPARIN (PORCINE) IN NACL 1000-0.9 UT/500ML-% IV SOLN
INTRAVENOUS | Status: DC | PRN
  Administered 2019-03-03: 500 mL

## 2019-03-03 MED ORDER — ASPIRIN 81 MG PO TBEC: 81.0000 mg | DELAYED_RELEASE_TABLET | Freq: Every day | ORAL | 0 refills | Status: AC

## 2019-03-03 MED ORDER — SODIUM CHLORIDE 0.9% FLUSH: 3.0000 mL | INTRAVENOUS | Status: DC | PRN

## 2019-03-03 MED ORDER — ISOSORBIDE MONONITRATE ER 30 MG PO TB24: 15.0000 mg | ORAL_TABLET | Freq: Every day | ORAL | 0 refills | Status: DC

## 2019-03-03 MED ORDER — ASPIRIN EC 81 MG PO TBEC
DELAYED_RELEASE_TABLET | ORAL | Status: AC
  Filled 2019-03-03: qty 1

## 2019-03-03 MED ORDER — SODIUM CHLORIDE 0.9 % IV SOLN: 250.0000 mL | INTRAVENOUS | Status: DC | PRN

## 2019-03-03 MED ORDER — ASPIRIN 81 MG PO CHEW
CHEWABLE_TABLET | ORAL | Status: AC
  Filled 2019-03-03: qty 1

## 2019-03-03 MED ORDER — MIDAZOLAM HCL 2 MG/2ML IJ SOLN
INTRAMUSCULAR | Status: AC
  Filled 2019-03-03: qty 2

## 2019-03-03 MED ORDER — LABETALOL HCL 5 MG/ML IV SOLN: 10.0000 mg | INTRAVENOUS | Status: AC | PRN

## 2019-03-03 MED ORDER — IPRATROPIUM-ALBUTEROL 0.5-2.5 (3) MG/3ML IN SOLN
RESPIRATORY_TRACT | Status: AC
  Filled 2019-03-03: qty 3

## 2019-03-03 MED ORDER — HEPARIN (PORCINE) IN NACL 1000-0.9 UT/500ML-% IV SOLN
INTRAVENOUS | Status: AC
  Filled 2019-03-03: qty 1000

## 2019-03-03 MED ORDER — MIDAZOLAM HCL 2 MG/2ML IJ SOLN
INTRAMUSCULAR | Status: DC | PRN
  Administered 2019-03-03 (×2): 1 mg via INTRAVENOUS

## 2019-03-03 MED ORDER — IPRATROPIUM-ALBUTEROL 0.5-2.5 (3) MG/3ML IN SOLN
3.0000 mL | Freq: Four times a day (QID) | RESPIRATORY_TRACT | Status: DC
  Administered 2019-03-03: 3 mL via RESPIRATORY_TRACT
  Filled 2019-03-03: qty 3

## 2019-03-03 MED ORDER — SODIUM CHLORIDE 0.9 % WEIGHT BASED INFUSION: 1.0000 mL/kg/h | INTRAVENOUS | Status: DC

## 2019-03-03 MED ORDER — IOHEXOL 300 MG/ML  SOLN
INTRAMUSCULAR | Status: DC | PRN
  Administered 2019-03-03: 55 mL

## 2019-03-03 MED ORDER — FENTANYL CITRATE (PF) 100 MCG/2ML IJ SOLN
INTRAMUSCULAR | Status: DC | PRN
  Administered 2019-03-03 (×2): 25 ug via INTRAVENOUS

## 2019-03-03 MED ORDER — IPRATROPIUM-ALBUTEROL 0.5-2.5 (3) MG/3ML IN SOLN
3.0000 mL | Freq: Once | RESPIRATORY_TRACT | Status: AC
  Administered 2019-03-03: 3 mL via RESPIRATORY_TRACT

## 2019-03-03 MED ORDER — FENTANYL CITRATE (PF) 100 MCG/2ML IJ SOLN
INTRAMUSCULAR | Status: AC
  Filled 2019-03-03: qty 2

## 2019-03-03 MED ORDER — ONDANSETRON HCL 4 MG/2ML IJ SOLN: 4.0000 mg | Freq: Four times a day (QID) | INTRAMUSCULAR | Status: DC | PRN

## 2019-03-03 MED ORDER — SODIUM CHLORIDE 0.9 % WEIGHT BASED INFUSION
3.0000 mL/kg/h | INTRAVENOUS | Status: DC
  Administered 2019-03-03: 3 mL/kg/h via INTRAVENOUS

## 2019-03-03 MED ORDER — ATORVASTATIN CALCIUM 80 MG PO TABS: 80.0000 mg | ORAL_TABLET | Freq: Every day | ORAL | 0 refills | Status: DC

## 2019-03-03 MED ORDER — METOPROLOL TARTRATE 25 MG PO TABS: 12.5000 mg | ORAL_TABLET | Freq: Two times a day (BID) | ORAL | 0 refills | Status: DC

## 2019-03-03 MED ORDER — ACETAMINOPHEN 325 MG PO TABS
650.0000 mg | ORAL_TABLET | ORAL | Status: DC | PRN
  Filled 2019-03-03: qty 2

## 2019-03-03 SURGICAL SUPPLY — 9 items
CATH INFINITI 5FR JL4 (CATHETERS) ×2 IMPLANT
CATH INFINITI JR4 5F (CATHETERS) ×2 IMPLANT
DEVICE CLOSURE MYNXGRIP 5F (Vascular Products) ×2 IMPLANT
KIT MANI 3VAL PERCEP (MISCELLANEOUS) ×3 IMPLANT
NDL PERC 18GX7CM (NEEDLE) IMPLANT
NEEDLE PERC 18GX7CM (NEEDLE) ×3 IMPLANT
PACK CARDIAC CATH (CUSTOM PROCEDURE TRAY) ×3 IMPLANT
SHEATH AVANTI 5FR X 11CM (SHEATH) ×2 IMPLANT
WIRE GUIDERIGHT .035X150 (WIRE) ×2 IMPLANT

## 2019-03-03 NOTE — Progress Notes (Signed)
Cath prep completed at this time by NT with assistance from this nurse.

## 2019-03-03 NOTE — Discharge Summary (Signed)
Physician Discharge Summary  Opie Fanton SNK:[id number] DOB: [birth year] DOA: 03/01/2019  PCP: Loletta Specter, PA-C  Admit date: 03/01/2019 Discharge date: 03/03/2019  Admitted From: home Disposition: home  Recommendations for Outpatient Follow-up:  1. Follow up with PCP in 1-2 weeks 2. F/u cardio in 1-2 weeks   Home Health: no Equipment/Devices:  Discharge Condition: stable CODE STATUS: full  Diet recommendation: Heart Healthy / Carb Modified  Brief/Interim Summary: HPI was taken from Dr. Renaldo Reel: Krista Alvarez is a 49 y.o. female with medical history significant of  CAD seen in ed with c/o chest pain. Pt has a significant cardiac history and is here for chest pain,[facility] [place]. Pt still smokes 1 pp2days. , pt started to smoke since age of 46.  Counseled pt extensively on tobacco abuse and risk factor for smoking. Pt is crying because of her pain. Pt also has a lot of arthritis pain in her back as well.   ED Course:  Blood pressure 113/79, pulse 78, temperature 98.1 F (36.7 C), temperature source Oral, resp. rate 13, height 5\' 4"  (1.626 m), weight 63.5 kg, SpO2 98 %. Pt received morphine and sl ntg and pt is chest pain free. EKG did not show any changes.  The nitroglycerine helped her but chest pain is in her upper back and rad to her left shoulder nad left arm.   Hospital Course from Dr. 2/10-2/11/21: Pt presented w/ chest pain w/ significant PMH of CAD & DM2. Cardiac cath was done and showed chronically occluded RCA stent collaterals from left to right as per cardio. Cardio recommended aggressive medical management (w/ metoprolol, isosorbide, aspirin, statin), smoking cessation & close outpatient cardiology f/u.   Discharge Diagnoses:  Principal Problem:   Unstable angina (HCC) Active Problems:   TOBACCO DEPENDENCE   CAD (coronary artery disease)   Type 2 diabetes mellitus with complications (HCC)   Tobacco abuse    COLD (chronic obstructive lung disease) (HCC)   Hypertension  CAD: s/p cardiac cath which showed chronically occluded RCA stent collaterals from left to right as per cardio. Continue on metoprolol, aspirin, imdur, statin. D/c IV heparin drip. Continue on tele.   DM2: continue on SSI w/ accuchecks while inpatient. Continue on home po hypoglycemics at d/c   Tobacco abuse: smoking cessation counseling.  HTN: continue on metoprolol  HLD: continue on statin   Discharge Instructions  Discharge Instructions    AMB Referral to Cardiac Rehabilitation - Phase II   Complete by: As directed    Diagnosis: Stable Angina   After initial evaluation and assessments completed: Virtual Based Care may be provided alone or in conjunction with Phase 2 Cardiac Rehab based on patient barriers.: Yes   Diet - low sodium heart healthy   Complete by: As directed    Discharge instructions   Complete by: As directed    F/u PCP in 1-2 weeks; F/u cardio (Dr. 09-28-1970) in 1-2 weeks   Increase activity slowly   Complete by: As directed      Allergies as of 03/03/2019      Reactions   Hydrocodone-acetaminophen Itching   Ibuprofen    "interacted with meds" per pt's family      Medication List    TAKE these medications   aspirin 81 MG EC tablet Take 1 tablet (81 mg total) by mouth daily.   atorvastatin 80 MG tablet Commonly known as: LIPITOR Take 1 tablet (80 mg total) by mouth daily at 6  PM.   isosorbide mononitrate 30 MG 24 hr tablet Commonly known as: IMDUR Take 0.5 tablets (15 mg total) by mouth daily.   metoprolol tartrate 25 MG tablet Commonly known as: LOPRESSOR Take 0.5 tablets (12.5 mg total) by mouth 2 (two) times daily.   nitroGLYCERIN 0.4 MG SL tablet Commonly known as: NITROSTAT Place 0.4 mg under the tongue every 5 (five) minutes as needed for chest pain.      Follow-up Information    Loletta Specter, PA-C In 2 weeks.   Specialty: Physician Assistant Why: patient will need  to make an appoinment Contact information: [address] [place] [address] [phone]        Antonieta Iba, MD In 2 weeks.   Specialty: Cardiology Why: Patient can see Dr. Mariah Milling on the Feb. 25th at 10:00am Contact information: [address] [place] [address] [phone]          Allergies  Allergen Reactions  . Hydrocodone-Acetaminophen Itching  . Ibuprofen     "interacted with meds" per pt's family     Consultations:  cardio   Procedures/Studies: DG Chest 2 View  Result Date: 03/01/2019 CLINICAL DATA:  Chest pain, pain in mid back and left arm. EXAM: CHEST - 2 VIEW COMPARISON:  06/24/2017 FINDINGS: Heart size and mediastinal contours are normal. Hilar structures are unremarkable. Lungs are clear. No signs of pleural effusion. Visualized skeletal structures are unremarkable. IMPRESSION: Normal exam. Electronically Signed   By: Donzetta Kohut M.D.   On: 03/01/2019 10:57   CARDIAC CATHETERIZATION  Result Date: 03/03/2019  Dist LAD lesion is 30% stenosed.  Prox LAD lesion is 30% stenosed.  Mid Cx lesion is 70% stenosed.  Mid Cx to Dist Cx lesion is 90% stenosed.  RPDA lesion is 30% stenosed.  Previously placed Mid RCA to Dist RCA drug eluting stent is widely patent.  Balloon angioplasty was performed.  Dist RCA lesion is 50% stenosed.  Prox RCA lesion is 40% stenosed.  Prox RCA to Dist RCA lesion is 100% stenosed.  Balloon angioplasty was performed.  The left ventricular ejection fraction is 50-55% by visual estimate.  LV end diastolic pressure is normal.  The left ventricular systolic function is normal.  There is no mitral valve regurgitation.    ECHOCARDIOGRAM COMPLETE  Result Date: 03/02/2019    ECHOCARDIOGRAM REPORT   Patient Name:   Krista Alvarez Date of Exam: 03/01/2019 Medical Rec #:  [id number]        Height:       64.0 in Accession #:    [id number]       Weight:       158.4 lb Date of Birth:  [birth year]        BSA:           1.77 m Patient Age:    48 years         BP:           134/89 mmHg Patient Gender: F                HR:           64 bpm. Exam Location:  [facility] Procedure: 2D Echo, 3D Echo, Cardiac Doppler, Color Doppler and Strain Analysis Indications:     R07.9 Chest pain  History:         Patient has prior history of Echocardiogram examinations, most  recent 06/26/2017. Risk Factors:Hypertension, Dyslipidemia and                  Diabetes. Pneumonia. NSTEMI. Migraines. DVT. Coronary artery                  disease. COPD. Congestive heart failure.  Sonographer:     Sedonia Small Rodgers-Jones Referring Phys:  [id number] Lennon Alstrom Diagnosing Phys: Yvonne Kendall MD IMPRESSIONS  1. Left ventricular ejection fraction, by estimation, is 55 to 60%. The left ventricle has normal function. The left ventrical demonstrates regional wall motion abnormalities (see scoring diagram/findings for description). Left ventricular diastolic parameters were normal. There is mild hypokinesis of the left ventricular, basal-mid inferior wall. The average left ventricular global longitudinal strain is -20.8 %.  2. Right ventricular systolic function is normal. The right ventricular size is normal. Tricuspid regurgitation signal is inadequate for assessing PA pressure.  3. The mitral valve is degenerative. Mild mitral valve regurgitation.  4. The aortic valve was not well visualized. Aortic valve regurgitation is not visualized. No aortic stenosis is present.  5. The inferior vena cava is normal in size with <50% respiratory variability, suggesting right atrial pressure of 8 mmHg. FINDINGS  Left Ventricle: Left ventricular ejection fraction, by estimation, is 55 to 60%. The left ventricle has normal function. The left ventricle demonstrates regional wall motion abnormalities. Mild hypokinesis of the left ventricular, basal-mid inferior wall. The average left ventricular global longitudinal strain is -20.8 %. The left ventricular  internal cavity size was normal in size. There is no left ventricular hypertrophy. Left ventricular diastolic parameters were normal. Right Ventricle: The right ventricular size is normal. No increase in right ventricular wall thickness. Right ventricular systolic function is normal. Tricuspid regurgitation signal is inadequate for assessing PA pressure. Left Atrium: Left atrial size was normal in size. Right Atrium: Right atrial size was normal in size. Pericardium: There is no evidence of pericardial effusion. Mitral Valve: The mitral valve is degenerative in appearance. There is mild thickening of the mitral valve leaflet(s). Mild mitral valve regurgitation. No evidence of mitral valve stenosis. Tricuspid Valve: The tricuspid valve is not well visualized. Tricuspid valve regurgitation is not demonstrated. Aortic Valve: The aortic valve was not well visualized. Aortic valve regurgitation is not visualized. No aortic stenosis is present. Pulmonic Valve: The pulmonic valve was not well visualized. Pulmonic valve regurgitation is not visualized. No evidence of pulmonic stenosis. Aorta: The aortic root is normal in size and structure. Pulmonary Artery: The pulmonary artery is not well seen. Venous: The inferior vena cava is normal in size with less than 50% respiratory variability, suggesting right atrial pressure of 8 mmHg. IAS/Shunts: The interatrial septum was not well visualized.  LEFT VENTRICLE PLAX 2D LVIDd:         4.86 cm  Diastology LVIDs:         3.75 cm  LV e' lateral:   9.46 cm/s LV PW:         0.73 cm  LV E/e' lateral: 9.3 LV IVS:        0.78 cm  LV e' medial:    8.49 cm/s LVOT diam:     1.90 cm  LV E/e' medial:  10.4 LV SV:         55.00 ml LV SV Index:   27.85    2D Longitudinal Strain LVOT Area:     2.84 cm 2D Strain GLS (A2C):   -20.8 %  2D Strain GLS (A3C):   -16.9 %                         2D Strain GLS (A4C):   -24.8 %                         2D Strain GLS Avg:     -20.8 %                           3D Volume EF:                         3D EF:        56 %                         LV EDV:       146 ml                         LV ESV:       64 ml                         LV SV:        82 ml RIGHT VENTRICLE RV Basal diam:  2.96 cm RV S prime:     13.20 cm/s TAPSE (M-mode): 2.7 cm LEFT ATRIUM             Index       RIGHT ATRIUM          Index LA diam:        3.70 cm 2.09 cm/m  RA Area:     8.69 cm LA Vol (A2C):   30.0 ml 16.93 ml/m RA Volume:   18.80 ml 10.61 ml/m LA Vol (A4C):   34.9 ml 19.70 ml/m LA Biplane Vol: 33.3 ml 18.79 ml/m  AORTIC VALVE LVOT Vmax:   87.50 cm/s LVOT Vmean:  64.200 cm/s LVOT VTI:    0.194 m  AORTA Ao Root diam: 3.00 cm MITRAL VALVE MV Area (PHT): 3.37 cm             SHUNTS MV Decel Time: 225 msec             Systemic VTI:  0.19 m MV E velocity: 88.10 cm/s 103 cm/s  Systemic Diam: 1.90 cm MV A velocity: 89.20 cm/s 70.3 cm/s MV E/A ratio:  0.99       1.5 Nelva Bush MD Electronically signed by Nelva Bush MD Signature Date/Time: 03/02/2019/6:52:23 AM    Final    CT Angio Chest/Abd/Pel for Dissection W and/or W/WO  Result Date: 03/01/2019 CLINICAL DATA:  Chest and back pain. EXAM: CT ANGIOGRAPHY CHEST, ABDOMEN AND PELVIS TECHNIQUE: Multidetector CT imaging through the chest, abdomen and pelvis was performed using the standard protocol during bolus administration of intravenous contrast. Multiplanar reconstructed images and MIPs were obtained and reviewed to evaluate the vascular anatomy. CONTRAST:  49mL OMNIPAQUE IOHEXOL 350 MG/ML SOLN COMPARISON:  September 28, 2017. FINDINGS: CTA CHEST FINDINGS Cardiovascular: Preferential opacification of the thoracic aorta. No evidence of thoracic aortic aneurysm or dissection. Normal heart size. No pericardial effusion. Mediastinum/Nodes: No enlarged mediastinal, hilar, or axillary lymph nodes. Thyroid gland, trachea, and esophagus demonstrate no significant findings. Lungs/Pleura: Lungs are clear. No pleural effusion  or pneumothorax. Musculoskeletal:  No chest wall abnormality. No acute or significant osseous findings. Review of the MIP images confirms the above findings. CTA ABDOMEN AND PELVIS FINDINGS VASCULAR Aorta: Normal caliber aorta without aneurysm, dissection, vasculitis or significant stenosis. Celiac: Patent without evidence of aneurysm, dissection, vasculitis or significant stenosis. SMA: Patent without evidence of aneurysm, dissection, vasculitis or significant stenosis. Renals: Both renal arteries are patent without evidence of aneurysm, dissection, vasculitis, fibromuscular dysplasia or significant stenosis. IMA: Patent without evidence of aneurysm, dissection, vasculitis or significant stenosis. Inflow: Patent without evidence of aneurysm, dissection, vasculitis or significant stenosis. Veins: No obvious venous abnormality within the limitations of this arterial phase study. Review of the MIP images confirms the above findings. NON-VASCULAR Hepatobiliary: No focal liver abnormality is seen. No gallstones, gallbladder wall thickening, or biliary dilatation. Pancreas: Unremarkable. No pancreatic ductal dilatation or surrounding inflammatory changes. Spleen: Normal in size without focal abnormality. Adrenals/Urinary Tract: Adrenal glands are unremarkable. Kidneys are normal, without renal calculi, focal lesion, or hydronephrosis. Bladder is unremarkable. Stomach/Bowel: Stomach is within normal limits. Appendix appears normal. No evidence of bowel wall thickening, distention, or inflammatory changes. Lymphatic: No significant adenopathy is noted. Reproductive: Uterus and bilateral adnexa are unremarkable. Other: No abdominal wall hernia or abnormality. No abdominopelvic ascites. Musculoskeletal: No acute or significant osseous findings. Review of the MIP images confirms the above findings. IMPRESSION: 1. No evidence of thoracic or abdominal aortic dissection or aneurysm. 2. No definite abnormality seen in the chest,  abdomen or pelvis. Electronically Signed   By: Lupita Raider M.D.   On: 03/01/2019 11:47       Subjective: Pt c/o fatigue    Discharge Exam: Vitals:   03/03/19 1225 03/03/19 1442  BP: 121/73   Pulse: 65 64  Resp:  18  Temp:    SpO2: 99% 99%   Vitals:   03/03/19 1105 03/03/19 1121 03/03/19 1225 03/03/19 1442  BP: 108/78 (!) 97/51 121/73   Pulse: (!) 58 (!) 57 65 64  Resp: Temp:      TempSrc:      SpO2: 97% 99% 99% 99%  Weight:      Height:        General: Pt is alert, awake, not in acute distress Cardiovascular: S1/S2 +, no rubs, no gallops Respiratory: CTA bilaterally, no wheezing, no rales  Abdominal: Soft, NT, ND, bowel sounds + Extremities: no edema, no cyanosis    The results of significant diagnostics from this hospitalization (including imaging, microbiology, ancillary and laboratory) are listed below for reference.     Microbiology: Recent Results (from the past 240 hour(s))  Respiratory Panel by RT PCR (Flu A&B, Covid) - Nasopharyngeal Swab     Status: None   Collection Time: 03/01/19 10:32 AM   Specimen: Nasopharyngeal Swab  Result Value Ref Range Status   SARS Coronavirus 2 by RT PCR NEGATIVE NEGATIVE Final    Comment: (NOTE) SARS-CoV-2 target nucleic acids are NOT DETECTED. The SARS-CoV-2 RNA is generally detectable in upper respiratoy specimens during the acute phase of infection. The lowest concentration of SARS-CoV-2 viral copies this assay can detect is 131 copies/mL. A negative result does not preclude SARS-Cov-2 infection and should not be used as the sole basis for treatment or other patient management decisions. A negative result may occur with  improper specimen collection/handling, submission of specimen other than nasopharyngeal swab, presence of viral mutation(s) within the areas targeted by this assay, and inadequate number of viral copies (<131 copies/mL). A negative result must  be combined with clinical observations,  patient history, and epidemiological information. The expected result is Negative. Fact Sheet for Patients:  https://www.moore.com/ Fact Sheet for Healthcare Providers:  https://www.young.biz/ This test is not yet ap proved or cleared by the [place] FDA and  has been authorized for detection and/or diagnosis of SARS-CoV-2 by FDA under an Emergency Use Authorization (EUA). This EUA will remain  in effect (meaning this test can be used) for the duration of the COVID-19 declaration under Section 564(b)(1) of the Act, 21 U.S.C. section 360bbb-3(b)(1), unless the authorization is terminated or revoked sooner.    Influenza A by PCR NEGATIVE NEGATIVE Final   Influenza B by PCR NEGATIVE NEGATIVE Final    Comment: (NOTE) The Xpert Xpress SARS-CoV-2/FLU/RSV assay is intended as an aid in  the diagnosis of influenza from Nasopharyngeal swab specimens and  should not be used as a sole basis for treatment. Nasal washings and  aspirates are unacceptable for Xpert Xpress SARS-CoV-2/FLU/RSV  testing. Fact Sheet for Patients: https://www.moore.com/ Fact Sheet for Healthcare Providers: https://www.young.biz/ This test is not yet approved or cleared by the [place] FDA and  has been authorized for detection and/or diagnosis of SARS-CoV-2 by  FDA under an Emergency Use Authorization (EUA). This EUA will remain  in effect (meaning this test can be used) for the duration of the  Covid-19 declaration under Section 564(b)(1) of the Act, 21  U.S.C. section 360bbb-3(b)(1), unless the authorization is  terminated or revoked. Performed at [facility], [address] [place], [place] [address]      Labs: BNP (last 3 results) No results for input(s): BNP in the last 8760 hours. Basic Metabolic Panel: Recent Labs  Lab 03/01/19 1008 03/01/19 1725 03/02/19 0638 03/03/19 0412  NA 138  --  136 136  K 4.2   --  4.4 4.4  CL 105  --  104 103  CO2 24  --  25 25  GLUCOSE 227*  --  177* 214*  BUN 17  --  14 18  CREATININE 0.58  --  0.50 0.41*  CALCIUM 9.1  --  8.6* 8.8*  MG  --  1.8 2.0  --   PHOS  --   --  3.3  --    Liver Function Tests: Recent Labs  Lab 03/01/19 1725 03/02/19 0638  AST 16 15  ALT 24 22  ALKPHOS 74 62  BILITOT 0.9 0.6  PROT 7.0 6.6  ALBUMIN 4.2 3.7   No results for input(s): LIPASE, AMYLASE in the last 168 hours. No results for input(s): AMMONIA in the last 168 hours. CBC: Recent Labs  Lab 03/01/19 1008 03/02/19 0638 03/03/19 0412  WBC 6.9 8.6 9.4  NEUTROABS 3.4 3.8  --   HGB 13.4 12.2 11.9*  HCT 39.2 36.9 35.2*  MCV 92.0 93.7 92.6  PLT 172 170 145*   Cardiac Enzymes: No results for input(s): CKTOTAL, CKMB, CKMBINDEX, TROPONINI in the last 168 hours. BNP: Invalid input(s): POCBNP CBG: Recent Labs  Lab 03/02/19 1607 03/02/19 2109 03/03/19 0810 03/03/19 1039 03/03/19 1147  GLUCAP 163* 199* 198* 154* 135*   D-Dimer No results for input(s): DDIMER in the last 72 hours. Hgb A1c Recent Labs    03/01/19 1529 03/02/19 0638  HGBA1C 7.3* 7.1*   Lipid Profile Recent Labs    03/02/19 0638  CHOL 155  HDL 35*  LDLCALC 76  TRIG 604*  CHOLHDL 4.4   Thyroid function studies Recent Labs    03/01/19 1529  TSH 4.693*   Anemia work up No results for input(s): VITAMINB12, FOLATE, FERRITIN, TIBC, IRON, RETICCTPCT in the last 72 hours. Urinalysis    Component Value Date/Time   COLORURINE YELLOW (A) 05/25/2016 1028   APPEARANCEUR HAZY (A) 05/25/2016 1028   APPEARANCEUR Clear 05/16/2012 1220   LABSPEC 1.011 05/25/2016 1028   LABSPEC 1.003 05/16/2012 1220   PHURINE 7.0 05/25/2016 1028   GLUCOSEU NEGATIVE 05/25/2016 1028   GLUCOSEU 50 mg/dL 15/72/6203 5597   HGBUR NEGATIVE 05/25/2016 1028   BILIRUBINUR NEGATIVE 05/25/2016 1028   BILIRUBINUR Negative 05/16/2012 1220   KETONESUR NEGATIVE 05/25/2016 1028   PROTEINUR NEGATIVE 05/25/2016 1028    NITRITE NEGATIVE 05/25/2016 1028   LEUKOCYTESUR NEGATIVE 05/25/2016 1028   LEUKOCYTESUR Negative 05/16/2012 1220   Sepsis Labs Invalid input(s): PROCALCITONIN,  WBC,  LACTICIDVEN Microbiology Recent Results (from the past 240 hour(s))  Respiratory Panel by RT PCR (Flu A&B, Covid) - Nasopharyngeal Swab     Status: None   Collection Time: 03/01/19 10:32 AM   Specimen: Nasopharyngeal Swab  Result Value Ref Range Status   SARS Coronavirus 2 by RT PCR NEGATIVE NEGATIVE Final    Comment: (NOTE) SARS-CoV-2 target nucleic acids are NOT DETECTED. The SARS-CoV-2 RNA is generally detectable in upper respiratoy specimens during the acute phase of infection. The lowest concentration of SARS-CoV-2 viral copies this assay can detect is 131 copies/mL. A negative result does not preclude SARS-Cov-2 infection and should not be used as the sole basis for treatment or other patient management decisions. A negative result may occur with  improper specimen collection/handling, submission of specimen other than nasopharyngeal swab, presence of viral mutation(s) within the areas targeted by this assay, and inadequate number of viral copies (<131 copies/mL). A negative result must be combined with clinical observations, patient history, and epidemiological information. The expected result is Negative. Fact Sheet for Patients:  https://www.moore.com/ Fact Sheet for Healthcare Providers:  https://www.young.biz/ This test is not yet ap proved or cleared by the [place] FDA and  has been authorized for detection and/or diagnosis of SARS-CoV-2 by FDA under an Emergency Use Authorization (EUA). This EUA will remain  in effect (meaning this test can be used) for the duration of the COVID-19 declaration under Section 564(b)(1) of the Act, 21 U.S.C. section 360bbb-3(b)(1), unless the authorization is terminated or revoked sooner.    Influenza A by PCR NEGATIVE NEGATIVE  Final   Influenza B by PCR NEGATIVE NEGATIVE Final    Comment: (NOTE) The Xpert Xpress SARS-CoV-2/FLU/RSV assay is intended as an aid in  the diagnosis of influenza from Nasopharyngeal swab specimens and  should not be used as a sole basis for treatment. Nasal washings and  aspirates are unacceptable for Xpert Xpress SARS-CoV-2/FLU/RSV  testing. Fact Sheet for Patients: https://www.moore.com/ Fact Sheet for Healthcare Providers: https://www.young.biz/ This test is not yet approved or cleared by the [place] FDA and  has been authorized for detection and/or diagnosis of SARS-CoV-2 by  FDA under an Emergency Use Authorization (EUA). This EUA will remain  in effect (meaning this test can be used) for the duration of the  Covid-19 declaration under Section 564(b)(1) of the Act, 21  U.S.C. section 360bbb-3(b)(1), unless the authorization is  terminated or revoked. Performed at [facility], [address] [place], [place] [address]      Time coordinating discharge: Over 30 minutes  SIGNED:   Charise Killian, MD  [facility] 03/03/2019, 5:08 PM Pager   If 7PM-7AM, please contact night-coverage [email or web address]

## 2019-03-03 NOTE — Progress Notes (Signed)
.    Progress Note  Patient Name: Krista Alvarez Date of Encounter: 03/03/2019  Primary Cardiologist: new to [facility] - consult by End  Subjective   No further chest pain overnight Cardiac catheterization today Occluded RCA stents, appears chronic Severe disease of small left circumflex in the mid to distal region, not amenable to stenting  Inpatient Medications    Scheduled Meds:  aspirin EC       [MAR Hold] aspirin EC  81 mg Oral Daily   [MAR Hold] atorvastatin  80 mg Oral q1800   [MAR Hold] insulin aspart  0-9 Units Subcutaneous TID WC   ipratropium-albuterol       [MAR Hold] isosorbide mononitrate  15 mg Oral Daily   [MAR Hold] metoprolol tartrate  12.5 mg Oral BID   sodium chloride flush  3 mL Intravenous Q12H   Continuous Infusions:  sodium chloride     [START ON 03/04/2019] sodium chloride 3 mL/kg/hr (03/03/19 0842)   Followed by   Melene Muller ON 03/04/2019] sodium chloride     heparin Stopped (03/03/19 0935)   PRN Meds: sodium chloride, [MAR Hold] acetaminophen, [MAR Hold]  morphine injection, [MAR Hold] nitroGLYCERIN, [MAR Hold] ondansetron (ZOFRAN) IV, sodium chloride flush   Vital Signs    Vitals:   03/03/19 0341 03/03/19 0808 03/03/19 0824 03/03/19 1035  BP: 114/81 124/75 139/86 128/73  Pulse: 68 60 63 65  Resp: 18 18 13 12   Temp: 97.8 F (36.6 C) 97.7 F (36.5 C) 97.6 F (36.4 C)   TempSrc: Oral Oral Oral   SpO2: 100% 100% 99% 98%  Weight:      Height:        Intake/Output Summary (Last 24 hours) at 03/03/2019 1041 Last data filed at 03/03/2019 0206 Gross per 24 hour  Intake 240 ml  Output 1400 ml  Net -1160 ml   Filed Weights   03/01/19 1007 03/01/19 1521  Weight: 63.5 kg 71.8 kg    Telemetry    Normal sinus rhythm personally Reviewed  ECG    NSR, 61 bpm, inferior Q waves and no acute changes, unchanged from prior - Personally Reviewed  Physical Exam   Constitutional:  oriented to person, place, and time. No distress.    HENT:  Head: Grossly normal Eyes:  no discharge. No scleral icterus.  Neck: No JVD, no carotid bruits  Cardiovascular: Regular rate and rhythm, no murmurs appreciated Pulmonary/Chest: Clear to auscultation bilaterally, no wheezes or rails Abdominal: Soft.  no distension.  no tenderness.  Musculoskeletal: Normal range of motion Neurological:  normal muscle tone. Coordination normal. No atrophy Skin: Skin warm and dry Psychiatric: normal affect, pleasant  Labs    Chemistry Recent Labs  Lab 03/01/19 1008 03/01/19 1725 03/02/19 0638 03/03/19 0412  NA 138  --  136 136  K 4.2  --  4.4 4.4  CL 105  --  104 103  CO2 24  --  25 25  GLUCOSE 227*  --  177* 214*  BUN 17  --  14 18  CREATININE 0.58  --  0.50 0.41*  CALCIUM 9.1  --  8.6* 8.8*  PROT  --  7.0 6.6  --   ALBUMIN  --  4.2 3.7  --   AST  --  16 15  --   ALT  --  24 22  --   ALKPHOS  --  74 62  --   BILITOT  --  0.9 0.6  --   GFRNONAA >60  --  >  60 >60  GFRAA >60  --  >60 >60  ANIONGAP 9  --  7 8     Hematology Recent Labs  Lab 03/01/19 1008 03/02/19 0638 03/03/19 0412  WBC 6.9 8.6 9.4  RBC 4.26 3.94 3.80*  HGB 13.4 12.2 11.9*  HCT 39.2 36.9 35.2*  MCV 92.0 93.7 92.6  MCH 31.5 31.0 31.3  MCHC 34.2 33.1 33.8  RDW 15.9* 16.1* 15.9*  PLT 172 170 145*    Cardiac EnzymesNo results for input(s): TROPONINI in the last 168 hours. No results for input(s): TROPIPOC in the last 168 hours.   BNPNo results for input(s): BNP, PROBNP in the last 168 hours.   DDimer No results for input(s): DDIMER in the last 168 hours.   Radiology    DG Chest 2 View  Result Date: 03/01/2019 IMPRESSION: Normal exam. Electronically Signed   By: Zetta Bills M.D.   On: 03/01/2019 10:57    CT Angio Chest/Abd/Pel for Dissection W and/or W/WO  Result Date: 03/01/2019 IMPRESSION: 1. No evidence of thoracic or abdominal aortic dissection or aneurysm. 2. No definite abnormality seen in the chest, abdomen or pelvis. Electronically Signed    By: Marijo Conception M.D.   On: 03/01/2019 11:47    Cardiac Studies   2D echo 03/01/2019: 1. Left ventricular ejection fraction, by estimation, is 55 to 60%. The  left ventricle has normal function. The left ventrical demonstrates  regional wall motion abnormalities (see scoring diagram/findings for  description). Left ventricular diastolic  parameters were normal. There is mild hypokinesis of the left ventricular,  basal-mid inferior wall. The average left ventricular global longitudinal  strain is -20.8 %.  2. Right ventricular systolic function is normal. The right ventricular  size is normal. Tricuspid regurgitation signal is inadequate for assessing  PA pressure.  3. The mitral valve is degenerative. Mild mitral valve regurgitation.  4. The aortic valve was not well visualized. Aortic valve regurgitation  is not visualized. No aortic stenosis is present.  5. The inferior vena cava is normal in size with <50% respiratory  variability, suggesting right atrial pressure of 8 mmHg.  __________  LHC 07/2016:  Small, circumflex system. Diffusely diseased obtuse marginal vessels.  Mild LAD disease, but mid to distal vessel is quite small.  Dist LAD lesion, 30 %stenosed.  Prox LAD lesion, 30 %stenosed.  Mid RCA lesion, 99 %stenosed.  A STENT SYNERGY DES [id number] drug eluting stent was successfully placed, postdilated to 3.3 mm.  Post intervention, there is a 0% residual stenosis.  The left ventricular ejection fraction is 35-45% by visual estimate, with inferior hypokinesis.  There is moderate left ventricular systolic dysfunction.  LV end diastolic pressure is moderately elevated.  There is no aortic valve stenosis.   Continue IV Angiomax until current bag runs out. She was loaded with Brilinta during the catheterization.  She will need aggressive secondary prevention including smoking cessation and diabetes control.   Synergy drug-eluting stent was chosen since there've  been some issues with compliance with her dual antiplatelet therapy in the past.  Patient Profile     49 y.o. female with history of CAD with inferior STEMI in 07/2017 s/p PCI/DES to the RCA, peripheral vascular disease status post emboli to bilateral iliac arteries and left hand, DM2, HTN, HLD, ongoing tobacco use, and medication noncompliance due to financial barriers, whom we are seeing for chest pain.   Assessment & Plan    1. CAD involving the native coronary arteries with prior inferior  STEMI in 07/2016 presenting with unstable angina: --- Chronic stable angina, chronically occluded RCA stent collaterals from left to right --- Unclear if symptoms are coming from a small diffusely diseased left circumflex (will be very difficult to stent given the small size of vessel).  Discussed with interventional cardiology --- Aggressive medical management, smoking cessation, continue low-dose metoprolol with isosorbide aspirin Lipitor --Needs close outpatient follow-up with cardiology  2. HTN:  Continue low-dose metoprolol with isosorbide as blood pressure tolerates  3. HLD: Lipitor been started, discussed with her goal LDL less than 70  4. Tobacco use: -Cessation advised We have encouraged her to continue to work on weaning her cigarettes and smoking cessation. She will continue to work on this and does not want any assistance with chantix.    Total encounter time more than 25 minutes  Greater than 50% was spent in counseling and coordination of care with the patient   For questions or updates, please contact [facility] Please consult [email or web address] for contact info under Cardiology/STEMI.    Signed, Dossie Arbour, MD, Ph.D [facility]

## 2019-03-03 NOTE — Consult Note (Signed)
ANTICOAGULATION CONSULT NOTE - Initial Consult  Pharmacy Consult for Heparin Drip Indication: chest pain/ACS/unstable angina  Allergies  Allergen Reactions  . Hydrocodone-Acetaminophen Itching  . Ibuprofen     "interacted with meds" per pt's family     Patient Measurements: Height: 5\' 4"  (162.6 cm) Weight: 158 lb 6.4 oz (71.8 kg) IBW/kg (Calculated) : 54.7 Heparin Dosing Weight: 69.4kg  Vital Signs: Temp: 97.8 F (36.6 C) (02/11 0341) Temp Source: Oral (02/11 0341) BP: 114/81 (02/11 0341) Pulse Rate: 68 (02/11 0341)  Labs: Recent Labs     0000 03/01/19 1008 03/01/19 1211 03/01/19 1529 03/01/19 1725 03/01/19 2258 03/01/19 2258 03/02/19 0254 03/02/19 2706 03/02/19 0920 03/02/19 1350 03/02/19 2207 03/03/19 0412  HGB   < > 13.4  --   --   --   --   --  12.2  --   --   --   --  11.9*  HCT  --  39.2  --   --   --   --   --  36.9  --   --   --   --  35.2*  PLT  --  172  --   --   --   --   --  170  --   --   --   --  145*  APTT  --   --   --   --  >160* 44*  --   --   --   --   --   --   --   LABPROT  --   --   --   --  14.8  --   --   --   --   --   --   --   --   INR  --   --   --   --  1.2  --   --   --   --   --   --   --   --   HEPARINUNFRC  --   --   --   --   --  0.18*   < > 0.24*   < >  --  0.21* 0.45 0.43  CREATININE  --  0.58  --   --   --   --   --  0.50  --   --   --   --  0.41*  TROPONINIHS  --  3   < > 4 3  --   --   --   --  3  --   --   --    < > = values in this interval not displayed.    Estimated Creatinine Clearance: 84.5 mL/min (A) (by C-G formula based on SCr of 0.41 mg/dL (L)).   Medical History: Past Medical History:  Diagnosis Date  . Acute ST elevation myocardial infarction (STEMI) of inferior wall ([place])    2018 07/23/16-STEMI PCI -->[facility] to [facility]   . Anxiety   . Arthritis    "hips" (08/30/2014)  . Asthma   . CHF (congestive heart failure) ([place])   . Chronic lower back pain   . COPD (chronic obstructive pulmonary disease) ([place])   .  Coronary artery disease    07/23/16-STEMI PCI -->DES to RCA, Ef normal   . Depression   . DVT (deep venous thrombosis) ([place])   . GERD (gastroesophageal reflux disease)   . Hypercholesterolemia   . Hypertension   . Migraine    "a few times/month" (08/30/2014)  .  NSTEMI (non-ST elevated myocardial infarction) ([facility]) 08/2013   [name] 09/17/2013  . Pneumonia "several times"  . Type II diabetes mellitus ([facility])     Medications:  No pta anticoagulant of record  Assessment: 49 y.o. female with a hx of moderate diffuse CAD with multiple catheterizations, hypertension, hyperlipidemia, DM2, current tobacco use, and who is being seen today for the evaluation of chest pain.  Pharmacy has been consulted to initiate and monitor a heparin drip for ACS/STEMI/unstable angina.  2/9 2258 HL 0.18 rebolus w/ heparin 2000 units IV x 1 and increase rate to 950 units/hr  2/10 0638 HL 0.24 rebolus heparin 1000 units, increase heparin rate to 1050 units/hr.  2/10 1350 HL 0.21 rebolus heparin 1500 units, increase heparin rate to 1200 units/hr.    Goal of Therapy:  Heparin level 0.3-0.7 units/ml Monitor platelets by anticoagulation protocol: Yes   Plan:  02/11 @ 0400 HL 0.43 therapeutic. Will continue current rate and will recheck HL w/ am labs, CBC trending down will continue to monitor.  Thomasene Ripple, PharmD, BCPS Clinical Pharmacist 03/03/2019 5:23 AM

## 2019-03-03 NOTE — Progress Notes (Signed)
IV and telemetry removed for discharge. Medication and discharge instructions reviewed with pt and her husband who is at bedside. Pt husband called the pharmacy and got a price on her medication ($38) which he said they can afford. RN asked if they need to meet with case management about anything before they left and they said didn't, they would be able to afford the medication. No distress noted. Right groin site level 0. Instruction reviewed with pt on site care and printed off instructions.understanding verbalized

## 2019-03-03 NOTE — Progress Notes (Signed)
Pt arrived back from cardiac cath. Right groin site level 0. No distress noted. Vss.

## 2019-03-16 NOTE — Progress Notes (Signed)
Office Visit    Patient Name: Krista Alvarez Date of Encounter: 03/17/2019  Primary Care Provider:  Loletta Specter, PA-C Primary Cardiologist:  Dr. Okey Dupre  Chief Complaint    49 y.o. female with a hx of moderate diffuse CAD with multiple catheterizations and most recently a 2018 cath 2/2 inferior STEMI with PCI to [facility] (see CV studies and HPI below) and previously on DAPT (discontinued 2/2 financial barriers), vascular dz s/p 06/2017 left hand/digital arteries angio with intra-arterial administration of TPA/nitro, financial barriers, G1DD, hypertension, hyperlipidemia, DM2, current tobacco use, and who is being seen today for hospital follow-up.  Past Medical History    Past Medical History:  Diagnosis Date  . Acute ST elevation myocardial infarction (STEMI) of inferior wall (HCC)    2018 07/23/16-STEMI PCI -->DES to [facility]   . Anxiety   . Arthritis    "hips" (08/30/2014)  . Asthma   . CHF (congestive heart failure) (HCC)   . Chronic lower back pain   . COPD (chronic obstructive pulmonary disease) (HCC)   . Coronary artery disease    07/23/16-STEMI PCI -->DES to RCA, Ef normal   . Depression   . DVT (deep venous thrombosis) (HCC)   . GERD (gastroesophageal reflux disease)   . Hypercholesterolemia   . Hypertension   . Migraine    "a few times/month" (08/30/2014)  . NSTEMI (non-ST elevated myocardial infarction) (HCC) 08/2013   [name] 09/17/2013  . Pneumonia "several times"  . Type II diabetes mellitus (HCC)    Past Surgical History:  Procedure Laterality Date  . CARDIAC CATHETERIZATION    . CARDIAC CATHETERIZATION N/A 08/31/2014   Procedure: Left Heart Cath and Coronary Angiography;  Surgeon: Kathleene Hazel, MD;  Location: [facility] INVASIVE CV LAB;  Service: Cardiovascular;  Laterality: N/A;  . CORONARY ANGIOPLASTY    . CORONARY/GRAFT ACUTE MI REVASCULARIZATION N/A 07/23/2016   Procedure: Coronary/Graft Acute MI Revascularization;  Surgeon: Corky Crafts, MD;   Location: [facility] INVASIVE CV LAB;  Service: Cardiovascular;  Laterality: N/A;  . LEFT HEART CATH AND CORONARY ANGIOGRAPHY N/A 07/23/2016   Procedure: Left Heart Cath and Coronary Angiography;  Surgeon: Corky Crafts, MD;  Location: [facility] INVASIVE CV LAB;  Service: Cardiovascular;  Laterality: N/A;  . LEFT HEART CATH AND CORONARY ANGIOGRAPHY N/A 03/03/2019   Procedure: LEFT HEART CATH AND CORONARY ANGIOGRAPHY;  Surgeon: Antonieta Iba, MD;  Location: [facility] INVASIVE CV LAB;  Service: Cardiovascular;  Laterality: N/A;  . PERCUTANEOUS PINNING PHALANX FRACTURE OF HAND Left    "plate & pins"  . TONSILLECTOMY    . UPPER EXTREMITY ANGIOGRAPHY Left 06/26/2017   Procedure: UPPER EXTREMITY ANGIOGRAPHY;  Surgeon: Nada Libman, MD;  Location: [facility] INVASIVE CV LAB;  Service: Cardiovascular;  Laterality: Left;  TPA administration single bolus    Allergies  Allergies  Allergen Reactions  . Hydrocodone-Acetaminophen Itching  . Ibuprofen     "interacted with meds" per pt's family     History of Present Illness    49 year old female with PMH as above and including CAD with caths as below for NSTEMI, STEMI, and UA. She also has a history of PVD with L hand TPA needed as outlined below in 2019.  She has a known family history of severe cardiac disease.  She has seen Dr. Welton Flakes in the past (2015) but since followed with [facility].   In 08/2013, she presented to the [facility] ED with CP and started on aggrastat IV with BB and ASA. She underwent 2015  LHC for NSTEMI that showed 60 to 70% distal LAD stenosis with mild to moderate disease in theRCA . In 08/2014, she was seen at [facility] for left sided stabbing chest pain that radiated to her left shoulder and similar to her 2015 MI with HS Tn negative.  She underwent 2016 LHC for unstable angina that showed mild to moderate diffuse triple-vessel CAD with diabetic appearing vessels but no focal severe stenosis.  In 07/2016, she was seen again at [facility] for substernal chest pain after  being out of her clopidogrel for over a month. EKG consistent with inferior posterior STEMI.  HS Tn peaked at 4.78. She underwent emergent 2018 LHC STEMI with 99% mRCA stenosis and PCI to [facility]. She was loaded with Brilinta and a Synergy stent was placed to her mRCA lesion. EF reported 35 to 45% with inferior hypokinesis.  LVEDP mildly elevated.  She was continued on Angiomax post-cath. Aggressive secondary prevention was advised.  She was discharged on DAPT with ASA and Brilinta, BB (escalation precluded by bradycardia), statin with Lovaza 1g BID.  Echo EF nl and  showed mid and basal inferior wall hypokinesis. In 06/2017, she underwent left hand angio with intra-arterial administration of TPA and nitro with improvement in angiographic evaluation of digital arteries. It was noted that the patient still had filling defects in the bilateral iliac arteries; however, she was asymptomatic from these with palpable dorsalis pedis pulses bilaterally.  These were therefore managed with anticoagulation.  Echo EF 60 to 65%, G1DD, mild LAE, trivial TR. Most recently, she was admitted 03/01/19 with CP not relieved by nitro. She underwent 03/03/19 LHC for unstable angina that showed occluded RCA stents (noted to appear chronic) and severe dz of Lxc in the mid to distal region that was not amenable to stenting. Aggressive medical management, smoking cessation, and risk factor modification were recommended.   Since that time, she reports that she has not been doing well.  She is joined by her husband and frequently tearful on exam. She reports significant stressors, including a son that is in jail for at least 6-7 more years.  She also reports difficulties with her boss at [employer].  She continues to experience episodic chest pain, especially during stressful situations or over the heat of the grill.  Associated symptoms include shortness of breath and diaphoresis.  She also notes dyspnea on exertion.  She has not yet started  cardiac rehab but received the referral the other day.  She does not notice any relief with Imdur and unfortunately did not receive sublingual nitro at discharge (prescription provided today).  Shortness of breath and dyspnea on exertion.  She denies any signs or symptoms of bleeding.  She reports ongoing barriers to care, including cost of medications.  She reports that the current medications she is on are $35, which works with their budget.  She continues to smoke with cessation advised.  BP today elevated from baseline with consideration of current stress.  Home Medications    Prior to Admission medications   Medication Sig Start Date End Date Taking? Authorizing Provider  aspirin EC 81 MG EC tablet Take 1 tablet (81 mg total) by mouth daily. 03/03/19 04/02/19  Charise Killian, MD  atorvastatin (LIPITOR) 80 MG tablet Take 1 tablet (80 mg total) by mouth daily at 6 PM. 03/03/19 04/02/19  Charise Killian, MD  isosorbide mononitrate (IMDUR) 30 MG 24 hr tablet Take 0.5 tablets (15 mg total) by mouth daily. 03/03/19 04/02/19  Fabienne Bruns  M, MD  metoprolol tartrate (LOPRESSOR) 25 MG tablet Take 0.5 tablets (12.5 mg total) by mouth 2 (two) times daily. 03/03/19 04/02/19  Wyvonnia Dusky, MD  nitroGLYCERIN (NITROSTAT) 0.4 MG SL tablet Place 0.4 mg under the tongue every 5 (five) minutes as needed for chest pain.    [provider]   Review of Systems    She reports chest pain, dyspnea, pnd, orthopnea, n, v, dizziness, edema, weight gain, or early satiety. She denies palpitations and recent syncope or early satiety.  All other systems reviewed and are otherwise negative except as noted above.  Physical Exam    VS:  BP 140/90 (BP Location: Left Arm, Patient Position: Sitting, Cuff Size: Normal)   Pulse 64   Ht 5\' 4"  (1.626 m)   Wt 158 lb 4 oz (71.8 kg)   SpO2 98%   BMI 27.16 kg/m  , BMI Body mass index is 27.16 kg/m. GEN: Well nourished, well developed, in no acute distress.  Tearful on exam. HEENT: normal. Neck: Supple, no JVD, carotid bruits, or masses. Cardiac: RRR, no murmurs, rubs, or gallops. No clubbing, cyanosis, edema.  Radials/DP/PT 2+ and equal bilaterally. R Femoral cath site without bruit or signs of hematoma/infection. Respiratory:  Respirations regular and unlabored, clear to auscultation bilaterally. GI: Soft, nontender, nondistended, BS + x 4. MS: no deformity or atrophy. Skin: warm and dry, no rash. Neuro:  Strength and sensation are intact. Psych: Normal affect.  Accessory Clinical Findings    ECG personally reviewed by me today -NSR, 64bpm, LAE enlargement, inferior Q waves noted on previous EKGs- no acute changes.  LABS  reviewed   [facility] Labs present: No  Lab Results  Component Value Date   WBC 9.4 03/03/2019   HGB 11.9 (L) 03/03/2019   HCT 35.2 (L) 03/03/2019   MCV 92.6 03/03/2019   PLT 145 (L) 03/03/2019   Lab Results  Component Value Date   CREATININE 0.41 (L) 03/03/2019   BUN 18 03/03/2019   NA 136 03/03/2019   K 4.4 03/03/2019   CL 103 03/03/2019   CO2 25 03/03/2019   Lab Results  Component Value Date   ALT 22 03/02/2019   AST 15 03/02/2019   ALKPHOS 62 03/02/2019   BILITOT 0.6 03/02/2019   Lab Results  Component Value Date   CHOL 155 03/02/2019   HDL 35 (L) 03/02/2019   LDLCALC 76 03/02/2019   TRIG 221 (H) 03/02/2019   CHOLHDL 4.4 03/02/2019    Lab Results  Component Value Date   HGBA1C 7.1 (H) 03/02/2019   Lab Results  Component Value Date   TSH 4.693 (H) 03/01/2019    VITALS Reviewed   Wt Readings from Last 3 Encounters:  03/17/19 158 lb 4 oz (71.8 kg)  03/01/19 158 lb 6.4 oz (71.8 kg)  09/28/17 150 lb (68 kg)   Temp Readings from Last 3 Encounters:  03/03/19 97.6 F (36.4 C) (Oral)  09/28/17 98.6 F (37 C) (Oral)  06/27/17 98.1 F (36.7 C) (Oral)   BP Readings from Last 3 Encounters:  03/17/19 140/90  03/03/19 121/73  09/28/17 134/78   Pulse Readings from Last 3  Encounters:  03/17/19 64  03/03/19 64  09/28/17 80    STUDIES/PROCEDURES reviewed    LHC 03/03/19  Dist LAD lesion is 30% stenosed.  Prox LAD lesion is 30% stenosed.  Mid Cx lesion is 70% stenosed.  Mid Cx to Dist Cx lesion is 90% stenosed.  RPDA lesion is 30% stenosed.  Previously  placed Mid RCA to Dist RCA drug eluting stent is widely patent.  Balloon angioplasty was performed.  Dist RCA lesion is 50% stenosed.  Prox RCA lesion is 40% stenosed.  Prox RCA to Dist RCA lesion is 100% stenosed.  Balloon angioplasty was performed.  The left ventricular ejection fraction is 50-55% by visual estimate.  LV end diastolic pressure is normal.  The left ventricular systolic function is normal.  There is no mitral valve regurgitation.  Echo 03/01/19 1. Left ventricular ejection fraction, by estimation, is 55 to 60%. The  left ventricle has normal function. The left ventrical demonstrates  regional wall motion abnormalities (see scoring diagram/findings for  description). Left ventricular diastolic  parameters were normal. There is mild hypokinesis of the left ventricular,  basal-mid inferior wall. The average left ventricular global longitudinal  strain is -20.8 %.  2. Right ventricular systolic function is normal. The right ventricular  size is normal. Tricuspid regurgitation signal is inadequate for assessing  PA pressure.  3. The mitral valve is degenerative. Mild mitral valve regurgitation.  4. The aortic valve was not well visualized. Aortic valve regurgitation  is not visualized. No aortic stenosis is present.  5. The inferior vena cava is normal in size with <50% respiratory  variability, suggesting right atrial pressure of 8 mmHg.    Assessment & Plan    CAD with history of inferior STEMI with PCI and chronic angina --Reports ongoing angina, despite Imdur.  S/p recent 03/03/19 cardiac catheterization as above.  Reviewed cath report and echo in detail.   Reviewed most recent catheterization in detail.  Continues to note exacerbation of chest pain and SOB/DOE with heat from the grill, as well as stress.  Work note provided today for days missed at work 2/2 recent admission with catheterization.  Discussed risk factor modification and lifestyle changes, including smoking cessation and glycemic control.  Encourage smoking cessation. Discussed stress and anxiety and its influence on her sx and overall health. Continue medical management. Increased Imdur to 30mg  qd to take in addition to BB for additional antianginal effect. Refilled PRN sublingual nitro.  Continue ASA (with CBC recommended as below) and statin.  HTN --BP elevated today 140/90 with consideration that patient tearful; however, given previous Bps, will add losartan 25 mg daily given comorbid DM2.  BMET and magnesium today and in 1 week.  Current heart rate 64bpm and precludes escalation of beta-blocker.  Goal BP <130/80.  Recommended she start checking her blood pressure twice daily at home.  HLD --Continue statin therapy.  Recommend follow-up lipid and liver function in 2 weeks in approximately 6 to 8 weeks from start of high intensity statin. 03/02/2019  LDL 76 with total cholesterol 155. LDL goal <70.  DM2, poorly controlled --Recommend tight glycemic control. Glucose 214 on 2/11. Hgb A1C 7.1.  Previously on Metformin.  Lifestyle and dietary changes recommended.  Peripheral vascular disease --No signs of worsening PVD.  Continue to monitor.  BP and lipid control recommended.  Continue ASA.  As below, recommend update CBC given low Hgb and per PCP.  Anemia --Recommend CBC per PCP.  Denies any signs or symptoms of acute bleeding on exam today; however, most recent Hgb 11.9 and steadily decreasing from previous labs. Monitor as could be contributing to sx of chest pain and fatigue.  Abnormal TSH --Abnormal 03/01/19 at 4.693. Ordered TSH/FT4.  Consider as contributing to her symptoms.   Further workup per PCP and pending labs.  Snoring, Apnea --Husband reports that she snores  and has episodes of apnea, during which time she does not breathe at all.  Referral to pulmonology provided today.  Reviewed the risk factors associated with untreated sleep apnea.  Tobacco use --Smoking cessation advised; however, she reports that, due to stress, this is not an ideal time for cessation. Advised call 1-800-QUIT-NOW (939-634-8576) for help with quitting smoking.   Financial barriers / barriers to care --Discussed GoodRx and barriers to care. She intends to research application for disability.  Medication changes: Start Losartan 25mg  daily. Increase Imdur to 30mg  daily. Labs ordered: BMET/Mg today and in 1 week, TSH/FT4, Lipid and liver in 2 weeks (6-8 weeks from start of high intensity statin). Studies / Imaging ordered: None. Disposition: RTC in 2 weeks.  Total time spent with patient today 45 minutes. This includes reviewing records, evaluating the patient, and coordinating care. Face-to-face time >50%.   Lennon Alstrom, PA-C 03/17/2019, 7:04 PM

## 2019-03-17 ENCOUNTER — Encounter: Payer: Self-pay | Admitting: Physician Assistant

## 2019-03-17 ENCOUNTER — Ambulatory Visit (INDEPENDENT_AMBULATORY_CARE_PROVIDER_SITE_OTHER): Payer: Self-pay | Admitting: Physician Assistant

## 2019-03-17 ENCOUNTER — Other Ambulatory Visit: Payer: Self-pay

## 2019-03-17 VITALS — BP 140/90 | HR 64 | Ht 64.0 in | Wt 158.2 lb

## 2019-03-17 DIAGNOSIS — I25118 Atherosclerotic heart disease of native coronary artery with other forms of angina pectoris: Secondary | ICD-10-CM

## 2019-03-17 DIAGNOSIS — I10 Essential (primary) hypertension: Secondary | ICD-10-CM

## 2019-03-17 DIAGNOSIS — I739 Peripheral vascular disease, unspecified: Secondary | ICD-10-CM

## 2019-03-17 DIAGNOSIS — R7989 Other specified abnormal findings of blood chemistry: Secondary | ICD-10-CM

## 2019-03-17 DIAGNOSIS — E785 Hyperlipidemia, unspecified: Secondary | ICD-10-CM

## 2019-03-17 DIAGNOSIS — E118 Type 2 diabetes mellitus with unspecified complications: Secondary | ICD-10-CM

## 2019-03-17 DIAGNOSIS — R0683 Snoring: Secondary | ICD-10-CM

## 2019-03-17 DIAGNOSIS — I25110 Atherosclerotic heart disease of native coronary artery with unstable angina pectoris: Secondary | ICD-10-CM

## 2019-03-17 DIAGNOSIS — D649 Anemia, unspecified: Secondary | ICD-10-CM

## 2019-03-17 DIAGNOSIS — R0681 Apnea, not elsewhere classified: Secondary | ICD-10-CM

## 2019-03-17 DIAGNOSIS — Z720 Tobacco use: Secondary | ICD-10-CM

## 2019-03-17 MED ORDER — ISOSORBIDE MONONITRATE ER 30 MG PO TB24: 30.0000 mg | ORAL_TABLET | Freq: Every day | ORAL | 6 refills | Status: DC

## 2019-03-17 MED ORDER — LOSARTAN POTASSIUM 25 MG PO TABS: 25.0000 mg | ORAL_TABLET | Freq: Every day | ORAL | 3 refills | Status: DC

## 2019-03-17 MED ORDER — NITROGLYCERIN 0.4 MG SL SUBL: 0.4000 mg | SUBLINGUAL_TABLET | SUBLINGUAL | 3 refills | Status: DC | PRN

## 2019-03-17 NOTE — Patient Instructions (Addendum)
Medication Instructions:  1- INCREASE Imdur Take 1 tablet (30 mg total) by mouth daily 2- START Losartan Take 1 tablet (25 mg total) by mouth daily  *If you need a refill on your cardiac medications before your next appointment, please call your pharmacy*  Lab Work: 1- Your physician recommends that you have lab work today(Frr T4, tsh, bmet, Regulatory affairs officer)  2- Your physician recommends that you return for lab work in: 1 weeks at [facility] [employer] (BMET, Mag) No appt is needed. Hours are M-F 7AM- 6 PM.  3- Your physician recommends that you return for lab work in: [facility]. You will need to be fasting. (lipid, lft) No appt is needed. Hours are M-F 7AM- 6 PM.   If you have labs (blood work) drawn today and your tests are completely normal, you will receive your results only by: [name] MyChart Message (if you have MyChart) OR . A paper copy in the mail If you have any lab test that is abnormal or we need to change your treatment, we will call you to review the results.  Testing/Procedures: 1- Ref to Pulm for sleep study for sleep apnea  Follow-Up: At [facility], you and your health needs are our priority.  As part of our continuing mission to provide you with exceptional heart care, we have created designated Provider Care Teams.  These Care Teams include your primary Cardiologist (physician) and Advanced Practice Providers (APPs -  Physician Assistants and Nurse Practitioners) who all work together to provide you with the care you need, when you need it.  Your next appointment:   2 week(s)  The format for your next appointment:   In Person  Provider:     You may see Yvonne Kendall, MD or Marisue Ivan, PA-C

## 2019-03-18 LAB — BASIC METABOLIC PANEL
BUN/Creatinine Ratio: 15 (ref 9–23)
BUN: 8 mg/dL (ref 6–24)
CO2: 20 mmol/L (ref 20–29)
Calcium: 9.7 mg/dL (ref 8.7–10.2)
Chloride: 101 mmol/L (ref 96–106)
Creatinine, Ser: 0.55 mg/dL — ABNORMAL LOW (ref 0.57–1.00)
GFR calc Af Amer: 129 mL/min/{1.73_m2} (ref >59)
GFR calc non Af Amer: 112 mL/min/{1.73_m2} (ref >59)
Glucose: 141 mg/dL — ABNORMAL HIGH (ref 65–99)
Potassium: 4.4 mmol/L (ref 3.5–5.2)
Sodium: 138 mmol/L (ref 134–144)

## 2019-03-18 LAB — MAGNESIUM: Magnesium: 2 mg/dL (ref 1.6–2.3)

## 2019-03-18 LAB — T4, FREE: Free T4: 0.94 ng/dL (ref 0.82–1.77)

## 2019-03-18 LAB — TSH: TSH: 1.11 u[IU]/mL (ref 0.450–4.500)

## 2019-03-22 ENCOUNTER — Telehealth: Payer: Self-pay

## 2019-03-22 NOTE — Telephone Encounter (Signed)
-----   Message from Lennon Alstrom, PA-C sent at 03/21/2019  7:00 AM EST ----- Please lt Ms. Horace know that her TSH is within normal limits and improved from previous labs. Renal function and electrolytes are stable from previous labs. We will recheck again in 2 weeks with BMET/Mg and after starting losartan 25mg  daily.

## 2019-03-22 NOTE — Telephone Encounter (Signed)
Attempted to call patient with results. No answer and no voicemail.

## 2019-03-22 NOTE — Telephone Encounter (Signed)
-----   Message from Jacquelyn D Visser, PA-C sent at 03/21/2019  7:00 AM EST ----- Please lt Ms. Pinon know that her TSH is within normal limits and improved from previous labs. Renal function and electrolytes are stable from previous labs. We will recheck again in 2 weeks with BMET/Mg and after starting losartan 25mg daily. 

## 2019-03-23 NOTE — Telephone Encounter (Signed)
Left message on patients voicemail to please return our call.   

## 2019-03-25 NOTE — Telephone Encounter (Signed)
Attempted to call the patient on her home #.  No answer- voice mail box if full.  Attempted to call her cell #. No answer- I left a message to please call back.

## 2019-03-29 ENCOUNTER — Ambulatory Visit (INDEPENDENT_AMBULATORY_CARE_PROVIDER_SITE_OTHER): Payer: Self-pay | Admitting: Licensed Clinical Social Worker

## 2019-03-29 ENCOUNTER — Other Ambulatory Visit: Payer: Self-pay

## 2019-03-29 ENCOUNTER — Encounter (INDEPENDENT_AMBULATORY_CARE_PROVIDER_SITE_OTHER): Payer: Self-pay | Admitting: Primary Care

## 2019-03-29 ENCOUNTER — Ambulatory Visit (INDEPENDENT_AMBULATORY_CARE_PROVIDER_SITE_OTHER): Payer: Self-pay | Admitting: Primary Care

## 2019-03-29 VITALS — BP 133/87 | HR 72 | Temp 97.2°F | Ht 64.0 in | Wt 158.6 lb

## 2019-03-29 DIAGNOSIS — E119 Type 2 diabetes mellitus without complications: Secondary | ICD-10-CM

## 2019-03-29 DIAGNOSIS — F5101 Primary insomnia: Secondary | ICD-10-CM

## 2019-03-29 DIAGNOSIS — F17200 Nicotine dependence, unspecified, uncomplicated: Secondary | ICD-10-CM

## 2019-03-29 DIAGNOSIS — I10 Essential (primary) hypertension: Secondary | ICD-10-CM

## 2019-03-29 DIAGNOSIS — I70203 Unspecified atherosclerosis of native arteries of extremities, bilateral legs: Secondary | ICD-10-CM

## 2019-03-29 DIAGNOSIS — F419 Anxiety disorder, unspecified: Secondary | ICD-10-CM

## 2019-03-29 DIAGNOSIS — F4323 Adjustment disorder with mixed anxiety and depressed mood: Secondary | ICD-10-CM

## 2019-03-29 DIAGNOSIS — Z09 Encounter for follow-up examination after completed treatment for conditions other than malignant neoplasm: Secondary | ICD-10-CM

## 2019-03-29 DIAGNOSIS — F329 Major depressive disorder, single episode, unspecified: Secondary | ICD-10-CM

## 2019-03-29 LAB — POCT CBG (FASTING - GLUCOSE)-MANUAL ENTRY: Glucose Fasting, POC: 205 mg/dL — AB (ref 70–99)

## 2019-03-29 MED ORDER — MELATONIN 5 MG PO TABS: 5.0000 mg | ORAL_TABLET | Freq: Every evening | ORAL | 0 refills | Status: DC | PRN

## 2019-03-29 MED ORDER — METFORMIN HCL 1000 MG PO TABS: 1000.0000 mg | ORAL_TABLET | Freq: Two times a day (BID) | ORAL | 3 refills | Status: DC

## 2019-03-29 MED FILL — metFORMIN HCL 1000 MG TABS: 1000 | 90 days supply | Qty: 180 | Fill #0

## 2019-03-29 NOTE — Progress Notes (Signed)
Established Patient Office Visit  Subjective:  Patient ID: Krista Alvarez, female    DOB: October 27, 1970  Age: 49 y.o. MRN: [id number]  CC:  Chief Complaint  Patient presents with  . Hospitalization Follow-up    angina     HPI Krista Alvarez presents for establishment of care with new provider but is established with [facility] and hospital discharge presented to the emergency room with chest pain admitted unstable angina. Management of diabetes. Started crying talking about work and how she was feeling.  Past Medical History:  Diagnosis Date  . Acute ST elevation myocardial infarction (STEMI) of inferior wall ([place])    2018 07/23/16-STEMI PCI -->DES to [facility]   . Anxiety   . Arthritis    "hips" (08/30/2014)  . Asthma   . CHF (congestive heart failure) ([place])   . Chronic lower back pain   . COPD (chronic obstructive pulmonary disease) ([place])   . Coronary artery disease    07/23/16-STEMI PCI -->DES to RCA, Ef normal   . Depression   . DVT (deep venous thrombosis) ([place])   . GERD (gastroesophageal reflux disease)   . Hypercholesterolemia   . Hypertension   . Migraine    "a few times/month" (08/30/2014)  . NSTEMI (non-ST elevated myocardial infarction) ([place]) 08/2013   Archie Endo 09/17/2013  . Pneumonia "several times"  . Type II diabetes mellitus ([place])     Past Surgical History:  Procedure Laterality Date  . CARDIAC CATHETERIZATION    . CARDIAC CATHETERIZATION N/A 08/31/2014   Procedure: Left Heart Cath and Coronary Angiography;  Surgeon: Burnell Blanks, MD;  Location: [place] [facility];  Service: Cardiovascular;  Laterality: N/A;  . CORONARY ANGIOPLASTY    . CORONARY/GRAFT ACUTE MI REVASCULARIZATION N/A 07/23/2016   Procedure: Coronary/Graft Acute MI Revascularization;  Surgeon: Jettie Booze, MD;  Location: [place] [facility];  Service: Cardiovascular;  Laterality: N/A;  . LEFT HEART CATH AND CORONARY ANGIOGRAPHY N/A 07/23/2016   Procedure: Left Heart Cath and Coronary  Angiography;  Surgeon: Jettie Booze, MD;  Location: [place] [facility];  Service: Cardiovascular;  Laterality: N/A;  . LEFT HEART CATH AND CORONARY ANGIOGRAPHY N/A 03/03/2019   Procedure: LEFT HEART CATH AND CORONARY ANGIOGRAPHY;  Surgeon: Minna Merritts, MD;  Location: [place] [facility];  Service: Cardiovascular;  Laterality: N/A;  . PERCUTANEOUS PINNING PHALANX FRACTURE OF HAND Left    "plate & pins"  . TONSILLECTOMY    . UPPER EXTREMITY ANGIOGRAPHY Left 06/26/2017   Procedure: UPPER EXTREMITY ANGIOGRAPHY;  Surgeon: Serafina Mitchell, MD;  Location: [place] [facility];  Service: Cardiovascular;  Laterality: Left;  TPA administration single bolus    Family History  Problem Relation Age of Onset  . Heart attack Mother   . Heart attack Father     Social History   Socioeconomic History  . Marital status: Married    Spouse name: Not on file  . Number of children: Not on file  . Years of education: Not on file  . Highest education level: Not on file  Occupational History  . Not on file  Tobacco Use  . Smoking status: Current Every Day Smoker    Packs/day: 1.00    Years: 30.00    Pack years: 30.00    Types: Cigarettes  . Smokeless tobacco: Never Used  Substance and Sexual Activity  . Alcohol use: Yes    Comment: 08/30/2014 "I drink a couple times/month"  . Drug use: No    Types: Marijuana  Comment: "back in my younger days"  . Sexual activity: Yes  Other Topics Concern  . Not on file  Social History Narrative  . Not on file   Social Determinants of Health   Financial Resource Strain:   . Difficulty of Paying Living Expenses: Not on file  Food Insecurity:   . Worried About Programme researcher, broadcasting/film/video in the Last Year: Not on file  . Ran Out of Food in the Last Year: Not on file  Transportation Needs:   . Lack of Transportation (Medical): Not on file  . Lack of Transportation (Non-Medical): Not on file  Physical Activity:   . Days of Exercise per Week: Not on file   . Minutes of Exercise per Session: Not on file  Stress:   . Feeling of Stress : Not on file  Social Connections:   . Frequency of Communication with Friends and Family: Not on file  . Frequency of Social Gatherings with Friends and Family: Not on file  . Attends Religious Services: Not on file  . Active Member of Clubs or Organizations: Not on file  . Attends Banker Meetings: Not on file  . Marital Status: Not on file  Intimate Partner Violence:   . Fear of Current or Ex-Partner: Not on file  . Emotionally Abused: Not on file  . Physically Abused: Not on file  . Sexually Abused: Not on file    Outpatient Medications Prior to Visit  Medication Sig Dispense Refill  . aspirin EC 81 MG EC tablet Take 1 tablet (81 mg total) by mouth daily. 30 tablet 0  . atorvastatin (LIPITOR) 80 MG tablet Take 1 tablet (80 mg total) by mouth daily at 6 PM. 30 tablet 0  . isosorbide mononitrate (IMDUR) 30 MG 24 hr tablet Take 1 tablet (30 mg total) by mouth daily. 30 tablet 6  . losartan (COZAAR) 25 MG tablet Take 1 tablet (25 mg total) by mouth daily. 90 tablet 3  . metoprolol tartrate (LOPRESSOR) 25 MG tablet Take 0.5 tablets (12.5 mg total) by mouth 2 (two) times daily. 30 tablet 0  . nitroGLYCERIN (NITROSTAT) 0.4 MG SL tablet Place 1 tablet (0.4 mg total) under the tongue every 5 (five) minutes as needed for chest pain. 25 tablet 3   No facility-administered medications prior to visit.    Allergies  Allergen Reactions  . Hydrocodone-Acetaminophen Itching  . Ibuprofen     "interacted with meds" per pt's family     ROS Review of Systems  Psychiatric/Behavioral: Positive for agitation. The patient is nervous/anxious.   All other systems reviewed and are negative.     Objective:    Physical Exam  BP 133/87 (BP Location: Right Arm, Patient Position: Sitting, Cuff Size: Normal)   Pulse 72   Temp (!) 97.2 F (36.2 C) (Temporal)   Ht 5\' 4"  (1.626 m)   Wt 158 lb 9.6 oz (71.9  kg)   SpO2 95%   BMI 27.22 kg/m  Wt Readings from Last 3 Encounters:  03/29/19 158 lb 9.6 oz (71.9 kg)  03/17/19 158 lb 4 oz (71.8 kg)  03/01/19 158 lb 6.4 oz (71.8 kg)     Health Maintenance Due  Topic Date Due  . PNEUMOCOCCAL POLYSACCHARIDE VACCINE AGE 92-64 HIGH RISK  11/23/1973  . OPHTHALMOLOGY EXAM  11/23/1981  . PAP SMEAR-Modifier  11/23/1992    There are no preventive care reminders to display for this patient.  Lab Results  Component Value Date   TSH  1.110 03/17/2019   Lab Results  Component Value Date   WBC 9.4 03/03/2019   HGB 11.9 (L) 03/03/2019   HCT 35.2 (L) 03/03/2019   MCV 92.6 03/03/2019   PLT 145 (L) 03/03/2019   Lab Results  Component Value Date   NA 138 03/17/2019   K 4.4 03/17/2019   CO2 20 03/17/2019   GLUCOSE 141 (H) 03/17/2019   BUN 8 03/17/2019   CREATININE 0.55 (L) 03/17/2019   BILITOT 0.6 03/02/2019   ALKPHOS 62 03/02/2019   AST 15 03/02/2019   ALT 22 03/02/2019   PROT 6.6 03/02/2019   ALBUMIN 3.7 03/02/2019   CALCIUM 9.7 03/17/2019   ANIONGAP 8 03/03/2019   Lab Results  Component Value Date   CHOL 155 03/02/2019   Lab Results  Component Value Date   HDL 35 (L) 03/02/2019   Lab Results  Component Value Date   LDLCALC 76 03/02/2019   Lab Results  Component Value Date   TRIG 221 (H) 03/02/2019   Lab Results  Component Value Date   CHOLHDL 4.4 03/02/2019   Lab Results  Component Value Date   HGBA1C 7.1 (H) 03/02/2019      Assessment & Plan:   No orders of the defined types were placed in this encounter.   Follow-up: Return in about 3 months (around 06/29/2019) for inperson Diabetes.    Grayce Sessions, NP Tawnie was seen today for hospitalization follow-up.  Diagnoses and all orders for this visit:  Type 2 diabetes mellitus without complication, without long-term current use of insulin (HCC)  Goal of therapy: Less than 6.5 hemoglobin A1c.   A1C- 7.1 3 weeks ago . Decrease foods that are high in  carbohydrates are the following rice, potatoes, breads, sugars, and pastas.  Reduction in the intake (eating) will assist in lowering your blood sugars.  Started on metformin 1000mg  bid -     Glucose (CBG), Fasting -     metFORMIN (GLUCOPHAGE) 1000 MG tablet; Take 1 tablet (1,000 mg total) by mouth 2 (two) times daily with a meal.  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 History of PVD   Hospital discharge follow-up Presented to the emergency room with complaints of chest pain.  Acute onset of squeezing pain in the center of her chest became severe and radiating towards her back.Initial EKG shows Q waves inferiorly, which I suspect is from an old MI, otherwise no acute ischemic changes. Admitted for further cardiac work up   Anxiety and depression She will see CSW today and discussed options.  TOBACCO DEPENDENCE She continues to smoke 1/2-1 ppd and aware of  increased risk for lung cancer and other respiratory diseases recommend cessation.  This will be reminded at each clinical visit.  Primary insomnia Information printed on AVS for treatment options

## 2019-03-29 NOTE — Patient Instructions (Addendum)
Diabetes Mellitus and Exercise Exercising regularly is important for your overall health, especially when you have diabetes (diabetes mellitus). Exercising is not only about losing weight. It has many other health benefits, such as increasing muscle strength and bone density and reducing body fat and stress. This leads to improved fitness, flexibility, and endurance, all of which result in better overall health. Exercise has additional benefits for people with diabetes, including:  Reducing appetite.  Helping to lower and control blood glucose.  Lowering blood pressure.  Helping to control amounts of fatty substances (lipids) in the blood, such as cholesterol and triglycerides.  Helping the body to respond better to insulin (improving insulin sensitivity).  Reducing how much insulin the body needs.  Decreasing the risk for heart disease by: ? Lowering cholesterol and triglyceride levels. ? Increasing the levels of good cholesterol. ? Lowering blood glucose levels. What is my activity plan? Your health care provider or certified diabetes educator can help you make a plan for the type and frequency of exercise (activity plan) that works for you. Make sure that you:  Do at least 150 minutes of moderate-intensity or vigorous-intensity exercise each week. This could be brisk walking, biking, or water aerobics. ? Do stretching and strength exercises, such as yoga or weightlifting, at least 2 times a week. ? Spread out your activity over at least 3 days of the week.  Get some form of physical activity every day. ? Do not go more than 2 days in a row without some kind of physical activity. ? Avoid being inactive for more than 30 minutes at a time. Take frequent breaks to walk or stretch.  Choose a type of exercise or activity that you enjoy, and set realistic goals.  Start slowly, and gradually increase the intensity of your exercise over time. What do I need to know about managing my  diabetes?   Check your blood glucose before and after exercising. ? If your blood glucose is 240 mg/dL (13.3 mmol/L) or higher before you exercise, check your urine for ketones. If you have ketones in your urine, do not exercise until your blood glucose returns to normal. ? If your blood glucose is 100 mg/dL (5.6 mmol/L) or lower, eat a snack containing 15-20 grams of carbohydrate. Check your blood glucose 15 minutes after the snack to make sure that your level is above 100 mg/dL (5.6 mmol/L) before you start your exercise.  Know the symptoms of low blood glucose (hypoglycemia) and how to treat it. Your risk for hypoglycemia increases during and after exercise. Common symptoms of hypoglycemia can include: ? Hunger. ? Anxiety. ? Sweating and feeling clammy. ? Confusion. ? Dizziness or feeling light-headed. ? Increased heart rate or palpitations. ? Blurry vision. ? Tingling or numbness around the mouth, lips, or tongue. ? Tremors or shakes. ? Irritability.  Keep a rapid-acting carbohydrate snack available before, during, and after exercise to help prevent or treat hypoglycemia.  Avoid injecting insulin into areas of the body that are going to be exercised. For example, avoid injecting insulin into: ? The arms, when playing tennis. ? The legs, when jogging.  Keep records of your exercise habits. Doing this can help you and your health care provider adjust your diabetes management plan as needed. Write down: ? Food that you eat before and after you exercise. ? Blood glucose levels before and after you exercise. ? The type and amount of exercise you have done. ? When your insulin is expected to peak, if you use   insulin. Avoid exercising at times when your insulin is peaking.  When you start a new exercise or activity, work with your health care provider to make sure the activity is safe for you, and to adjust your insulin, medicines, or food intake as needed.  Drink plenty of water while  you exercise to prevent dehydration or heat stroke. Drink enough fluid to keep your urine clear or pale yellow. Summary  Exercising regularly is important for your overall health, especially when you have diabetes (diabetes mellitus).  Exercising has many health benefits, such as increasing muscle strength and bone density and reducing body fat and stress.  Your health care provider or certified diabetes educator can help you make a plan for the type and frequency of exercise (activity plan) that works for you.  When you start a new exercise or activity, work with your health care provider to make sure the activity is safe for you, and to adjust your insulin, medicines, or food intake as needed. This information is not intended to replace advice given to you by your health care provider. Make sure you discuss any questions you have with your health care provider. Document Revised: 07/31/2016 Document Reviewed: 06/18/2015 Elsevier Patient Education  [place]. Can try melatonin 5mg -15 mg at night for sleep, can also do benadryl 25-50mg  at night for sleep.  If this does not help we can try prescription medication.  Also here is some information about good sleep hygiene.   Insomnia Insomnia is frequent trouble falling and/or staying asleep. Insomnia can be a long term problem or a short term problem. Both are common. Insomnia can be a short term problem when the wakefulness is related to a certain stress or worry. Long term insomnia is often related to ongoing stress during waking hours and/or poor sleeping habits. Overtime, sleep deprivation itself can make the problem worse. Every little thing feels more severe because you are overtired and your ability to cope is decreased. CAUSES   Stress, anxiety, and depression.  Poor sleeping habits.  Distractions such as TV in the bedroom.  Naps close to bedtime.  Engaging in emotionally charged conversations before bed.  Technical  reading before sleep.  Alcohol and other sedatives. They may make the problem worse. They can hurt normal sleep patterns and normal dream activity.  Stimulants such as caffeine for several hours prior to bedtime.  Pain syndromes and shortness of breath can cause insomnia.  Exercise late at night.  Changing time zones may cause sleeping problems (jet lag). It is sometimes helpful to have someone observe your sleeping patterns. They should look for periods of not breathing during the night (sleep apnea). They should also look to see how long those periods last. If you live alone or observers are uncertain, you can also be observed at a sleep clinic where your sleep patterns will be professionally monitored. Sleep apnea requires a checkup and treatment. Give your caregivers your medical history. Give your caregivers observations your family has made about your sleep.  SYMPTOMS   Not feeling rested in the morning.  Anxiety and restlessness at bedtime.  Difficulty falling and staying asleep. TREATMENT   Your caregiver may prescribe treatment for an underlying medical disorders. Your caregiver can give advice or help if you are using alcohol or other drugs for self-medication. Treatment of underlying problems will usually eliminate insomnia problems.  Medications can be prescribed for short time use. They are generally not recommended for lengthy use.  Over-the-counter sleep medicines are  not recommended for lengthy use. They can be habit forming.  You can promote easier sleeping by making lifestyle changes such as:  Using relaxation techniques that help with breathing and reduce muscle tension.  Exercising earlier in the day.  Changing your diet and the time of your last meal. No night time snacks.  Establish a regular time to go to bed.  Counseling can help with stressful problems and worry.  Soothing music and white noise may be helpful if there are background noises you cannot  remove.  Stop tedious detailed work at least one hour before bedtime. HOME CARE INSTRUCTIONS   Keep a diary. Inform your caregiver about your progress. This includes any medication side effects. See your caregiver regularly. Take note of:  Times when you are asleep.  Times when you are awake during the night.  The quality of your sleep.  How you feel the next day. This information will help your caregiver care for you.  Get out of bed if you are still awake after 15 minutes. Read or do some quiet activity. Keep the lights down. Wait until you feel sleepy and go back to bed.  Keep regular sleeping and waking hours. Avoid naps.  Exercise regularly.  Avoid distractions at bedtime. Distractions include watching television or engaging in any intense or detailed activity like attempting to balance the household checkbook.  Develop a bedtime ritual. Keep a familiar routine of bathing, brushing your teeth, climbing into bed at the same time each night, listening to soothing music. Routines increase the success of falling to sleep faster.  Use relaxation techniques. This can be using breathing and muscle tension release routines. It can also include visualizing peaceful scenes. You can also help control troubling or intruding thoughts by keeping your mind occupied with boring or repetitive thoughts like the old concept of counting sheep. You can make it more creative like imagining planting one beautiful flower after another in your backyard garden.  During your day, work to eliminate stress. When this is not possible use some of the previous suggestions to help reduce the anxiety that accompanies stressful situations. MAKE SURE YOU:   Understand these instructions.  Will watch your condition.  Will get help right away if you are not doing well or get worse. Document Released: 01/04/2000 Document Revised: 03/31/2011 Document Reviewed: 02/03/2007 [facility] Patient Information 2015  [place], [place]. This information is not intended to replace advice given to you by your health care provider. Make sure you discuss any questions you have with your health care provider.

## 2019-03-30 ENCOUNTER — Encounter: Payer: MEDICAID | Attending: Cardiovascular Disease | Admitting: *Deleted

## 2019-03-30 ENCOUNTER — Encounter: Payer: Self-pay | Admitting: *Deleted

## 2019-03-30 DIAGNOSIS — I208 Other forms of angina pectoris: Secondary | ICD-10-CM

## 2019-03-30 NOTE — Progress Notes (Signed)
Confirm Consent - "In the setting of the current Covid19 crisis, you are scheduled to join our "At Home" [facility]  Cardiac or Pulmonary  Rehab program . Just as we do with many in-gym visits, in order for you to participate in this program, we must obtain consent.  If you'd like, I can send this to your mychart (if signed up) or email for you to review.  Otherwise, I can obtain your verbal consent now.  By agreeing to a Cardiac or Pulmonary Rehab Telehealth visit, we'd like you to understand that the technology does not allow for your Cardiac or Pulmonary Rehab team member to perform a physical assessment, and thus may limit their ability to fully assess your ability to perform exercise programs. If your provider identifies any concerns that need to be evaluated in person, we will make arrangements to do so.  Finally, though the technology is pretty good, we cannot assure that it will always work on either your or our end and we cannot ensure that we have a secure connection.  Cardiac and Pulmonary Rehab Telehealth visits and "At Home" cardiac and pulmonary rehab are provided at no cost to you. Are you willing to proceed?" STAFF: Did the patient verbally acknowledge consent to telehealth visit? Document YES/NO here: Yes      Fabio Pierce, MA, RCEP, CCRP 03/30/2019 3:24 PM   Email: [email or web address] Phone: [phone] Diagnosis: Stable Angina CONSENT COMPLETED: YES/NO: Yes  Risk Stratification: high Risk Factors:  Causes; risk factors atherosclerosis: arteriosclerotic heart disease, diabetes mellitus, hypercholesterolemia, hypertension, smoking Current Exercise: non Patient Exercise Barriers: back pain/numbness Mobility Assistive Device at Home: no Vital Sign Devices at Home: Blood Pressure Cuff: YES/NO: Yes                                                Scale: YES/NO: No                                                Pulse Oximeter: YES/NO: Yes  Exercise Equipment at Home:  walking on  road   To use [employer]: YES/NO: No              Entered on Dashboard: YES/NO: No              SMS sent with invite: YES/NO: No    Next Appt Scheduled: 3/16 at 3pm

## 2019-03-31 ENCOUNTER — Other Ambulatory Visit: Payer: Self-pay

## 2019-03-31 ENCOUNTER — Ambulatory Visit (INDEPENDENT_AMBULATORY_CARE_PROVIDER_SITE_OTHER): Payer: Self-pay | Admitting: Internal Medicine

## 2019-03-31 ENCOUNTER — Encounter: Payer: Self-pay | Admitting: Internal Medicine

## 2019-03-31 VITALS — BP 124/74 | HR 79 | Ht 64.0 in | Wt 157.5 lb

## 2019-03-31 DIAGNOSIS — I255 Ischemic cardiomyopathy: Secondary | ICD-10-CM

## 2019-03-31 DIAGNOSIS — Z720 Tobacco use: Secondary | ICD-10-CM

## 2019-03-31 DIAGNOSIS — I25118 Atherosclerotic heart disease of native coronary artery with other forms of angina pectoris: Secondary | ICD-10-CM

## 2019-03-31 DIAGNOSIS — E785 Hyperlipidemia, unspecified: Secondary | ICD-10-CM

## 2019-03-31 DIAGNOSIS — I739 Peripheral vascular disease, unspecified: Secondary | ICD-10-CM

## 2019-03-31 MED ORDER — METOPROLOL TARTRATE 25 MG PO TABS: 25.0000 mg | ORAL_TABLET | Freq: Two times a day (BID) | ORAL | 1 refills | Status: DC

## 2019-03-31 MED ORDER — ISOSORBIDE MONONITRATE ER 30 MG PO TB24: 15.0000 mg | ORAL_TABLET | Freq: Every day | ORAL | 6 refills | Status: DC

## 2019-03-31 NOTE — Patient Instructions (Signed)
Medication Instructions:  Your physician has recommended you make the following change in your medication:  1. INCREASE Metoprolol tartrate. Take 25 mg, take one tablet by mouth twice daily. 2. DECREASE Imdur. Take 15 mg, take 0.5 tablet by mouth once daily. *If you need a refill on your cardiac medications before your next appointment, please call your pharmacy*   Lab Work: None If you have labs (blood work) drawn today and your tests are completely normal, you will receive your results only by: [name] MyChart Message (if you have MyChart) OR . A paper copy in the mail If you have any lab test that is abnormal or we need to change your treatment, we will call you to review the results.   Testing/Procedures: None   Follow-Up: At [facility], you and your health needs are our priority.  As part of our continuing mission to provide you with exceptional heart care, we have created designated Provider Care Teams.  These Care Teams include your primary Cardiologist (physician) and Advanced Practice Providers (APPs -  Physician Assistants and Nurse Practitioners) who all work together to provide you with the care you need, when you need it.  We recommend signing up for the patient portal called "MyChart".  Sign up information is provided on this After Visit Summary.  MyChart is used to connect with patients for Virtual Visits (Telemedicine).  Patients are able to view lab/test results, encounter notes, upcoming appointments, etc.  Non-urgent messages can be sent to your provider as well.   To learn more about what you can do with MyChart, go to [email or web address].    Your next appointment:   1 month(s)  The format for your next appointment:   In Person  Provider:    You may see Yvonne Kendall, MD or one of the following Advanced Practice Providers on your designated Care Team:    Nicolasa Ducking, NP  Eula Listen, PA-C  Marisue Ivan, PA-C

## 2019-03-31 NOTE — Progress Notes (Signed)
Follow-up Outpatient Visit Date: 03/31/2019  Primary Care Provider: Clent Demark, PA-C No address on file  Chief Complaint: Chest pain  HPI:  Krista Alvarez is a 49 y.o. female with history of coronary artery disease status post PCI to the mid/distal RCA in the setting of inferior STEMI in 07/2016, peripheral vascular disease status post emboli to bilateral iliac arteries as well as the left hand, hypertension, hyperlipidemia, type 2 diabetes mellitus, ongoing tobacco use, and medication noncompliance due to financial barriers, who presents for follow-up of coronary artery disease.  I met her during the hospitalization last month for chest pain.  Cardiac catheterization showed chronic total occlusion of the RCA as well as chronic subtotal occlusion with collateralization of the distal LCx/OM branch medical management was recommended.  She was seen in follow-up by Marrianne Mood, PA, on 03/17/2019 at which time she was tearful and stressed regarding incarceration of her son and difficulties with her boss at [employer].  She continued to experience episodic chest pain and exertional dyspnea.  She was advised to increase isosorbide mononitrate to 30 mg daily and start losartan 25 mg daily due to elevated blood pressure.  Today, Krista Alvarez reports that she continues to have intermittent chest pain with activity.  It improved with isosorbide mononitrate, though she has not tolerated escalation to 30 mg daily very well due to headaches.  She had to take sublingual nitroglycerin twice last week.  She also reports feeling short of breath all the time.  She has mild dependent leg edema.  She has chronic two-pillow orthopnea.  Her weight has been stable.  She denies bleeding.  --------------------------------------------------------------------------------------------------  Past Medical History:  Diagnosis Date  . Acute ST elevation myocardial infarction (STEMI) of inferior wall ([place])    2018  07/23/16-STEMI PCI -->DES to [facility]   . Anxiety   . Arthritis    "hips" (08/30/2014)  . Asthma   . CHF (congestive heart failure) ([place])   . Chronic lower back pain   . COPD (chronic obstructive pulmonary disease) ([place])   . Coronary artery disease    07/23/16-STEMI PCI -->DES to RCA, Ef normal   . Depression   . DVT (deep venous thrombosis) ([place])   . GERD (gastroesophageal reflux disease)   . Hypercholesterolemia   . Hypertension   . Migraine    "a few times/month" (08/30/2014)  . NSTEMI (non-ST elevated myocardial infarction) ([place]) 08/2013   Krista Alvarez 09/17/2013  . Pneumonia "several times"  . Type II diabetes mellitus ([place])    Past Surgical History:  Procedure Laterality Date  . CARDIAC CATHETERIZATION    . CARDIAC CATHETERIZATION N/A 08/31/2014   Procedure: Left Heart Cath and Coronary Angiography;  Surgeon: Burnell Blanks, MD;  Location: [place] [facility];  Service: Cardiovascular;  Laterality: N/A;  . CORONARY ANGIOPLASTY    . CORONARY/GRAFT ACUTE MI REVASCULARIZATION N/A 07/23/2016   Procedure: Coronary/Graft Acute MI Revascularization;  Surgeon: Jettie Booze, MD;  Location: [place] [facility];  Service: Cardiovascular;  Laterality: N/A;  . LEFT HEART CATH AND CORONARY ANGIOGRAPHY N/A 07/23/2016   Procedure: Left Heart Cath and Coronary Angiography;  Surgeon: Jettie Booze, MD;  Location: [place] [facility];  Service: Cardiovascular;  Laterality: N/A;  . LEFT HEART CATH AND CORONARY ANGIOGRAPHY N/A 03/03/2019   Procedure: LEFT HEART CATH AND CORONARY ANGIOGRAPHY;  Surgeon: Minna Merritts, MD;  Location: [place] [facility];  Service: Cardiovascular;  Laterality: N/A;  . PERCUTANEOUS PINNING PHALANX FRACTURE OF HAND Left    "  plate & pins"  . TONSILLECTOMY    . UPPER EXTREMITY ANGIOGRAPHY Left 06/26/2017   Procedure: UPPER EXTREMITY ANGIOGRAPHY;  Surgeon: Serafina Mitchell, MD;  Location: [place] [facility];  Service: Cardiovascular;  Laterality: Left;  TPA  administration single bolus     Recent CV Pertinent Labs: Lab Results  Component Value Date   CHOL 155 03/02/2019   CHOL 98 02/07/2014   HDL 35 (L) 03/02/2019   HDL 28 (L) 02/07/2014   LDLCALC 76 03/02/2019   LDLCALC 48 02/07/2014   TRIG 221 (H) 03/02/2019   TRIG 109 02/07/2014   CHOLHDL 4.4 03/02/2019   INR 1.2 03/01/2019   INR 1.0 02/07/2014   BNP 51.0 08/31/2014   K 4.4 03/17/2019   K 3.8 02/08/2014   MG 2.0 03/17/2019   BUN 8 03/17/2019   BUN 16 02/08/2014   CREATININE 0.55 (L) 03/17/2019   CREATININE 0.79 02/08/2014    Past medical and surgical history were reviewed and updated in EPIC.  Current Meds  Medication Sig  . aspirin EC 81 MG EC tablet Take 1 tablet (81 mg total) by mouth daily.  [name] atorvastatin (LIPITOR) 80 MG tablet Take 1 tablet (80 mg total) by mouth daily at 6 PM.  . isosorbide mononitrate (IMDUR) 30 MG 24 hr tablet Take 0.5 tablets (15 mg total) by mouth daily.  [name] losartan (COZAAR) 25 MG tablet Take 1 tablet (25 mg total) by mouth daily.  . Melatonin 5 MG TABS Take 1 tablet (5 mg total) by mouth at bedtime and may repeat dose one time if needed.  . metFORMIN (GLUCOPHAGE) 1000 MG tablet Take 1 tablet (1,000 mg total) by mouth 2 (two) times daily with a meal.  . metoprolol tartrate (LOPRESSOR) 25 MG tablet Take 1 tablet (25 mg total) by mouth 2 (two) times daily.  . nitroGLYCERIN (NITROSTAT) 0.4 MG SL tablet Place 1 tablet (0.4 mg total) under the tongue every 5 (five) minutes as needed for chest pain.  . [DISCONTINUED] isosorbide mononitrate (IMDUR) 30 MG 24 hr tablet Take 1 tablet (30 mg total) by mouth daily.  . [DISCONTINUED] metoprolol tartrate (LOPRESSOR) 25 MG tablet Take 0.5 tablets (12.5 mg total) by mouth 2 (two) times daily.    Allergies: Hydrocodone-acetaminophen and Ibuprofen  Social History   Tobacco Use  . Smoking status: Current Every Day Smoker    Packs/day: 1.00    Years: 30.00    Pack years: 30.00    Types: Cigarettes  .  Smokeless tobacco: Never Used  Substance Use Topics  . Alcohol use: Yes    Comment: 08/30/2014 "I drink a couple times/month"  . Drug use: No    Types: Marijuana    Comment: "back in my younger days"    Family History  Problem Relation Age of Onset  . Heart attack Mother   . Heart attack Father     Review of Systems: A 12-system review of systems was performed and was negative except as noted in the HPI.  --------------------------------------------------------------------------------------------------  Physical Exam: BP 124/74 (BP Location: Left Arm, Patient Position: Sitting, Cuff Size: Normal)   Pulse 79   Ht '5\' 4"'$  (1.626 m)   Wt 157 lb 8 oz (71.4 kg)   SpO2 97%   BMI 27.03 kg/m   General: NAD.  Accompanied by her husband. Neck: No JVD or HJR. Lungs: Mildly diminished breath sounds throughout without wheezes or crackles. Heart: Regular rate and rhythm without murmurs, rubs, or gallops. Abdomen: Soft, nontender, and nondistended.  Extremities: Trace ankle edema.  2+ radial and 1+ pedal pulses.  EKG: Sinus rhythm with occasional PVC's and inferolateral Q waves.  PVCs are now present and inferolateral Q waves more pronounced than on prior tracing from 03/17/2019.  Lab Results  Component Value Date   WBC 9.4 03/03/2019   HGB 11.9 (L) 03/03/2019   HCT 35.2 (L) 03/03/2019   MCV 92.6 03/03/2019   PLT 145 (L) 03/03/2019    Lab Results  Component Value Date   NA 138 03/17/2019   K 4.4 03/17/2019   CL 101 03/17/2019   CO2 20 03/17/2019   BUN 8 03/17/2019   CREATININE 0.55 (L) 03/17/2019   GLUCOSE 141 (H) 03/17/2019   ALT 22 03/02/2019    Lab Results  Component Value Date   CHOL 155 03/02/2019   HDL 35 (L) 03/02/2019   LDLCALC 76 03/02/2019   TRIG 221 (H) 03/02/2019   CHOLHDL 4.4 03/02/2019    --------------------------------------------------------------------------------------------------  ASSESSMENT AND PLAN: Coronary artery disease with stable  angina: Krista Alvarez continues to have chest pain with mild activity continues to stent with CCS class III angina.  She noticed some improvement with escalation and isosorbide mononitrate but has not been tolerating this well due to headaches.  I have recommended decreasing isosorbide mononitrate back to 15 mg daily and going up on metoprolol tartrate to 25 mg twice daily for antianginal therapy.  Further escalation, as tolerated, can be pursued at follow-up.  If she continues to have refractory angina, we may need to consider adding amlodipine or ranolazine.  Ultimately, she may require PCI to the chronic total occlusions of the RCA and LCx, though I worry about long-term patency given her young age and aggressive in-stent restenosis that has already occurred.  We will continue indefinite aspirin.  I will defer adding a P2Y 12 inhibitor at this time, given that she did not present with acute MI and also has mild anemia and thrombocytopenia.  Ischemic cardiomyopathy: Krista Alvarez has trace pretibial edema but otherwise appears euvolemic.  She has stable exertional dyspnea and two-pillow orthopnea that is likely due to a combination of her heart failure as well as smoking-related lung disease.  We will defer medication changes at this time.  LVEF was noted to be normal with inferior hypokinesis on last echo in early February.  Hyperlipidemia: Continue atorvastatin 80 mg daily.  PAD: No significant claudication.  Continue secondary prevention.  Tobacco abuse: Krista Alvarez continues to smoke; tobacco cessation was encouraged.  Follow-up: Return to clinic in 1 month.  Nelva Bush, MD 04/01/2019 6:53 PM

## 2019-04-01 ENCOUNTER — Encounter: Payer: Self-pay | Admitting: Internal Medicine

## 2019-04-01 NOTE — BH Specialist Note (Addendum)
Integrated Behavioral Health Initial Visit  MRN: [id number] Name: Krista Alvarez  Number of Integrated Behavioral Health Clinician visits:: 1/6 Session Start time: 10:00 AM  Session End time: 10:20 AM Total time: 20  Type of Service: Integrated Behavioral Health- Individual Interpretor:No. Interpretor Name and Language: NA   Warm Hand Off Completed.       SUBJECTIVE: Krista Alvarez is a 49 y.o. female accompanied by self Patient was referred by NP Randa Evens for anxiety. Patient reports the following symptoms/concerns: Pt reports increase in depression and anxiety symptoms triggered by unmanaged medical conditions and psychosocial stressors Duration of problem: Ongoing; Severity of problem: severe  OBJECTIVE: Mood: Anxious and Depressed and Affect: Tearful Risk of harm to self or others: No plan to harm self or others  LIFE CONTEXT: Family and Social: Pt receives strong support from spouse School/Work: Pt is employed Self-Care: Pt smokes cigarettes Life Changes: Pt reports ongoing psychosocial stressors and difficulty managing health conditions  GOALS ADDRESSED: Patient will: 1. Reduce symptoms of: anxiety, depression and stress 2. Increase knowledge and/or ability of: coping skills and healthy habits  3. Demonstrate ability to: Increase healthy adjustment to current life circumstances and Increase adequate support systems for patient/family  INTERVENTIONS: Interventions utilized: Mindfulness or Management consultant, Supportive Counseling and Psychoeducation and/or Health Education  Standardized Assessments completed: GAD-7 and PHQ 2&9  ASSESSMENT: Patient currently experiencing increase in depression and anxiety symptoms triggered by psychosocial stressors.   Patient may benefit from medication management and therapy. LCSW discussed correlation between one's physical and mental health. Sleep hygiene and self-care strategies discussed to assist in management of symptoms.  Pt was strongly encouraged to establish services with [facility], their contact information was provided.  PLAN: 1. Follow up with behavioral health clinician on : Contact LCSW with behavioral health and/or resource needs 2. Behavioral recommendations: Utilize strategies discussed in session 3. Referral(s): Integrated Art gallery manager (In Clinic) and Community Mental Health Services (LME/Outside Clinic) 4. "From scale of 1-10, how likely are you to follow plan?":   Bridgett Larsson, LCSW 04/01/2019 9:34 AM

## 2019-04-20 ENCOUNTER — Institutional Professional Consult (permissible substitution): Payer: Self-pay | Admitting: Primary Care

## 2019-05-04 ENCOUNTER — Ambulatory Visit: Payer: Self-pay | Admitting: Internal Medicine

## 2019-05-04 ENCOUNTER — Encounter: Payer: Self-pay | Admitting: *Deleted

## 2019-05-04 DIAGNOSIS — I208 Other forms of angina pectoris: Secondary | ICD-10-CM

## 2019-05-04 NOTE — Progress Notes (Deleted)
Follow-up Outpatient Visit Date: 05/04/2019  Primary Care Provider: Clent Demark, PA-C No address on file  Chief Complaint: ***  HPI:  Krista Alvarez is a 49 y.o. female with history of coronary artery disease status post PCI to the mid/distal RCA in the setting of inferior STEMI in 07/2016, peripheral vascular disease status post emboli to bilateral iliac arteries as well as the left hand, hypertension, hyperlipidemia, type 2 diabetes mellitus, ongoing tobacco use, and medication noncompliance due to financial barriers, who presents for follow-up of chest pain and coronary artery disease.  I last saw Krista Alvarez a month ago, at which time she reported intermittent chest pain with activity, improved with low-dose isosorbide mononitrate.  We agreed to continue with isosorbide mononitrate 15 mg daily due to headaches at higher doses and increase metoprolol tartrate to 25 mg twice daily.  --------------------------------------------------------------------------------------------------  Past Medical History:  Diagnosis Date  . Acute ST elevation myocardial infarction (STEMI) of inferior wall ([place])    2018 07/23/16-STEMI PCI -->DES to [facility] ([place]   . Anxiety   . Arthritis    "hips" (08/30/2014)  . Asthma   . CHF (congestive heart failure) ([place])   . Chronic lower back pain   . COPD (chronic obstructive pulmonary disease) ([place])   . Coronary artery disease    07/23/16-STEMI PCI -->DES to RCA, Ef normal   . Depression   . DVT (deep venous thrombosis) ([place])   . GERD (gastroesophageal reflux disease)   . Hypercholesterolemia   . Hypertension   . Migraine    "a few times/month" (08/30/2014)  . NSTEMI (non-ST elevated myocardial infarction) ([place]) 08/2013   Archie Endo 09/17/2013  . Pneumonia "several times"  . Type II diabetes mellitus ([place])    Past Surgical History:  Procedure Laterality Date  . CARDIAC CATHETERIZATION    . CARDIAC CATHETERIZATION N/A 08/31/2014   Procedure: Left Heart Cath and Coronary  Angiography;  Surgeon: Burnell Blanks, MD;  Location: [place] [facility] LAB;  Service: Cardiovascular;  Laterality: N/A;  . CORONARY ANGIOPLASTY    . CORONARY/GRAFT ACUTE MI REVASCULARIZATION N/A 07/23/2016   Procedure: Coronary/Graft Acute MI Revascularization;  Surgeon: Jettie Booze, MD;  Location: [place] [facility];  Service: Cardiovascular;  Laterality: N/A;  . LEFT HEART CATH AND CORONARY ANGIOGRAPHY N/A 07/23/2016   Procedure: Left Heart Cath and Coronary Angiography;  Surgeon: Jettie Booze, MD;  Location: [place] [facility];  Service: Cardiovascular;  Laterality: N/A;  . LEFT HEART CATH AND CORONARY ANGIOGRAPHY N/A 03/03/2019   Procedure: LEFT HEART CATH AND CORONARY ANGIOGRAPHY;  Surgeon: Minna Merritts, MD;  Location: [place] [facility] LAB;  Service: Cardiovascular;  Laterality: N/A;  . PERCUTANEOUS PINNING PHALANX FRACTURE OF HAND Left    "plate & pins"  . TONSILLECTOMY    . UPPER EXTREMITY ANGIOGRAPHY Left 06/26/2017   Procedure: UPPER EXTREMITY ANGIOGRAPHY;  Surgeon: Serafina Mitchell, MD;  Location: [place] [facility];  Service: Cardiovascular;  Laterality: Left;  TPA administration single bolus    No outpatient medications have been marked as taking for the 05/04/19 encounter (Appointment) with Ryszard Socarras, Harrell Gave, MD.    Allergies: Hydrocodone-acetaminophen and Ibuprofen  Social History   Tobacco Use  . Smoking status: Current Every Day Smoker    Packs/day: 1.00    Years: 30.00    Pack years: 30.00    Types: Cigarettes  . Smokeless tobacco: Never Used  Substance Use Topics  . Alcohol use: Yes    Comment: 08/30/2014 "I drink a  couple times/month"  . Drug use: No    Types: Marijuana    Comment: "back in my younger days"    Family History  Problem Relation Age of Onset  . Heart attack Mother   . Heart attack Father     Review of Systems: A 12-system review of systems was performed and was negative except as noted in the  HPI.  --------------------------------------------------------------------------------------------------  Physical Exam: There were no vitals taken for this visit.  General:  *** HEENT: No conjunctival pallor or scleral icterus. Facemask in place. Neck: Supple without lymphadenopathy, thyromegaly, JVD, or HJR. Lungs: Normal work of breathing. Clear to auscultation bilaterally without wheezes or crackles. Heart: Regular rate and rhythm without murmurs, rubs, or gallops. Non-displaced PMI. Abd: Bowel sounds present. Soft, NT/ND without hepatosplenomegaly Ext: No lower extremity edema. Radial, PT, and DP pulses are 2+ bilaterally. Skin: Warm and dry without rash.  EKG:  ***  Lab Results  Component Value Date   WBC 9.4 03/03/2019   HGB 11.9 (L) 03/03/2019   HCT 35.2 (L) 03/03/2019   MCV 92.6 03/03/2019   PLT 145 (L) 03/03/2019    Lab Results  Component Value Date   NA 138 03/17/2019   K 4.4 03/17/2019   CL 101 03/17/2019   CO2 20 03/17/2019   BUN 8 03/17/2019   CREATININE 0.55 (L) 03/17/2019   GLUCOSE 141 (H) 03/17/2019   ALT 22 03/02/2019    Lab Results  Component Value Date   CHOL 155 03/02/2019   HDL 35 (L) 03/02/2019   LDLCALC 76 03/02/2019   TRIG 221 (H) 03/02/2019   CHOLHDL 4.4 03/02/2019    --------------------------------------------------------------------------------------------------  ASSESSMENT AND PLAN: Krista Deer Jalaya Sarver, MD 05/04/2019 7:28 AM

## 2019-05-04 NOTE — Progress Notes (Signed)
Cardiac Individual Treatment Plan  Patient Details  Name: Krista Alvarez MRN: [id number] Date of Birth: [birth year] Referring Provider:    Initial Encounter Date:   Visit Diagnosis: Stable angina ([place])  Patient's Home Medications on Admission:  Current Outpatient Medications:  .  atorvastatin (LIPITOR) 80 MG tablet, Take 1 tablet (80 mg total) by mouth daily at 6 PM., Disp: 30 tablet, Rfl: 0 .  isosorbide mononitrate (IMDUR) 30 MG 24 hr tablet, Take 0.5 tablets (15 mg total) by mouth daily., Disp: 30 tablet, Rfl: 6 .  losartan (COZAAR) 25 MG tablet, Take 1 tablet (25 mg total) by mouth daily., Disp: 90 tablet, Rfl: 3 .  Melatonin 5 MG TABS, Take 1 tablet (5 mg total) by mouth at bedtime and may repeat dose one time if needed., Disp: 90 tablet, Rfl: 0 .  metFORMIN (GLUCOPHAGE) 1000 MG tablet, Take 1 tablet (1,000 mg total) by mouth 2 (two) times daily with a meal., Disp: 180 tablet, Rfl: 3 .  metoprolol tartrate (LOPRESSOR) 25 MG tablet, Take 1 tablet (25 mg total) by mouth 2 (two) times daily., Disp: 60 tablet, Rfl: 1 .  nitroGLYCERIN (NITROSTAT) 0.4 MG SL tablet, Place 1 tablet (0.4 mg total) under the tongue every 5 (five) minutes as needed for chest pain., Disp: 25 tablet, Rfl: 3  Past Medical History: Past Medical History:  Diagnosis Date  . Acute ST elevation myocardial infarction (STEMI) of inferior wall ([place])    2018 07/23/16-STEMI PCI -->DES to [facility]   . Anxiety   . Arthritis    "hips" (08/30/2014)  . Asthma   . CHF (congestive heart failure) ([place])   . Chronic lower back pain   . COPD (chronic obstructive pulmonary disease) ([place])   . Coronary artery disease    07/23/16-STEMI PCI -->DES to RCA, Ef normal   . Depression   . DVT (deep venous thrombosis) ([place])   . GERD (gastroesophageal reflux disease)   . Hypercholesterolemia   . Hypertension   . Migraine    "a few times/month" (08/30/2014)  . NSTEMI (non-ST elevated myocardial infarction) ([place]) 08/2013   Archie Endo 09/17/2013  .  Pneumonia "several times"  . Type II diabetes mellitus ([facility])     Tobacco Use: Social History   Tobacco Use  Smoking Status Current Every Day Smoker  . Packs/day: 1.00  . Years: 30.00  . Pack years: 30.00  . Types: Cigarettes  Smokeless Tobacco Never Used    Labs: Recent Review Flowsheet Data    Labs for ITP Cardiac and Pulmonary Rehab Latest Ref Rng & Units 07/23/2016 07/24/2016 06/25/2017 03/01/2019 03/02/2019   Cholestrol 0 - 200 mg/dL 179 - 170 - 155   LDLCALC 0 - 99 mg/dL 77 - 75 - 76   HDL >40 mg/dL 41 - 51 - 35(L)   Trlycerides <150 mg/dL 304(H) - 222(H) - 221(H)   Hemoglobin A1c 4.8 - 5.6 % - 5.3 5.5 7.3(H) 7.1(H)       Exercise Target Goals: Exercise Program Goal: Individual exercise prescription set using results from initial 6 min walk test and THRR while considering  patient's activity barriers and safety.   Exercise Prescription Goal: Initial exercise prescription builds to 30-45 minutes a day of aerobic activity, 2-3 days per week.  Home exercise guidelines will be given to patient during program as part of exercise prescription that the participant will acknowledge.   Education: Aerobic Exercise & Resistance Training: - Gives group verbal and written instruction on the various components of exercise. Focuses on  aerobic and resistive training programs and the benefits of this training and how to safely progress through these programs..   Education: Exercise & Equipment Safety: - Individual verbal instruction and demonstration of equipment use and safety with use of the equipment.   Education: Exercise Physiology & General Exercise Guidelines: - Group verbal and written instruction with models to review the exercise physiology of the cardiovascular system and associated critical values. Provides general exercise guidelines with specific guidelines to those with heart or lung disease.    Education: Flexibility, Balance, Mind/Body Relaxation: Provides group  verbal/written instruction on the benefits of flexibility and balance training, including mind/body exercise modes such as yoga, pilates and tai chi.  Demonstration and skill practice provided.   Activity Barriers & Risk Stratification: Activity Barriers & Cardiac Risk Stratification - 03/30/19 1509      Activity Barriers & Cardiac Risk Stratification   Activity Barriers  Back Problems;Neck/Spine Problems;Deconditioning;Chest Pain/Angina;Muscular Weakness    Cardiac Risk Stratification  High       6 Minute Walk:   Oxygen Initial Assessment:   Oxygen Re-Evaluation:   Oxygen Discharge (Final Oxygen Re-Evaluation):   Initial Exercise Prescription:   Perform Capillary Blood Glucose checks as needed.  Exercise Prescription Changes:   Exercise Comments:   Exercise Goals and Review:   Exercise Goals Re-Evaluation :   Discharge Exercise Prescription (Final Exercise Prescription Changes):   Nutrition:  Target Goals: Understanding of nutrition guidelines, daily intake of sodium '1500mg'$ , cholesterol '200mg'$ , calories 30% from fat and 7% or less from saturated fats, daily to have 5 or more servings of fruits and vegetables.  Education: Controlling Sodium/Reading Food Labels -Group verbal and written material supporting the discussion of sodium use in heart healthy nutrition. Review and explanation with models, verbal and written materials for utilization of the food label.   Education: General Nutrition Guidelines/Fats and Fiber: -Group instruction provided by verbal, written material, models and posters to present the general guidelines for heart healthy nutrition. Gives an explanation and review of dietary fats and fiber.   Biometrics:    Nutrition Therapy Plan and Nutrition Goals:   Nutrition Assessments:   MEDIFICTS Score Key:          ?70 Need to make dietary changes          40-70 Heart Healthy Diet         ? 40 Therapeutic Level Cholesterol  Diet  Nutrition Goals Re-Evaluation:   Nutrition Goals Discharge (Final Nutrition Goals Re-Evaluation):   Psychosocial: Target Goals: Acknowledge presence or absence of significant depression and/or stress, maximize coping skills, provide positive support system. Participant is able to verbalize types and ability to use techniques and skills needed for reducing stress and depression.   Education: Depression - Provides group verbal and written instruction on the correlation between heart/lung disease and depressed mood, treatment options, and the stigmas associated with seeking treatment.   Education: Sleep Hygiene -Provides group verbal and written instruction about how sleep can affect your health.  Define sleep hygiene, discuss sleep cycles and impact of sleep habits. Review good sleep hygiene tips.     Education: Stress and Anxiety: - Provides group verbal and written instruction about the health risks of elevated stress and causes of high stress.  Discuss the correlation between heart/lung disease and anxiety and treatment options. Review healthy ways to manage with stress and anxiety.    Initial Review & Psychosocial Screening: Initial Psych Review & Screening - 03/30/19 1514      Initial  Review   Current issues with  Current Sleep Concerns;Current Stress Concerns;Current Anxiety/Panic;Current Depression    Source of Stress Concerns  Chronic Illness    Comments  Pt is using melatonin for sleep, but it is not working, will talk to doctor about it tomorrow.  Talked to doctor yesterday about anxiety/depression, no longer enjoying going outside. Meds have not worked previously.  Feels like her nerves are starting to snap, work is also getting rougher on her both mentally and physcially.  Niece and Nephew and kids live with them.      Family Dynamics   Good Support System?  Yes   husband, son (in jail currently)     Barriers   Psychosocial barriers to participate in program  The  patient should benefit from training in stress management and relaxation.;Psychosocial barriers identified (see note)      Screening Interventions   Interventions  Encouraged to exercise;To provide support and resources with identified psychosocial needs;Provide feedback about the scores to participant    Expected Outcomes  Short Term goal: Utilizing psychosocial counselor, staff and physician to assist with identification of specific Stressors or current issues interfering with healing process. Setting desired goal for each stressor or current issue identified.;Long Term Goal: Stressors or current issues are controlled or eliminated.;Short Term goal: Identification and review with participant of any Quality of Life or Depression concerns found by scoring the questionnaire.;Long Term goal: The participant improves quality of Life and PHQ9 Scores as seen by post scores and/or verbalization of changes       Quality of Life Scores:   Scores of 19 and below usually indicate a poorer quality of life in these areas.  A difference of  2-3 points is a clinically meaningful difference.  A difference of 2-3 points in the total score of the Quality of Life Index has been associated with significant improvement in overall quality of life, self-image, physical symptoms, and general health in studies assessing change in quality of life.  PHQ-9: Recent Review Flowsheet Data    Depression screen [facility] 2/9 03/29/2019 08/27/2016   Decreased Interest 3 2   Down, Depressed, Hopeless 3 3   PHQ - 2 Score 6 5   Altered sleeping 2 1   Tired, decreased energy 3 3   Change in appetite 1 1   Feeling bad or failure about yourself  0 0   Trouble concentrating 2 1   Moving slowly or fidgety/restless 3 2   Suicidal thoughts 0 0   PHQ-9 Score 17 13   Difficult doing work/chores Very difficult -     Interpretation of Total Score  Total Score Depression Severity:  1-4 = Minimal depression, 5-9 = Mild depression, 10-14 =  Moderate depression, 15-19 = Moderately severe depression, 20-27 = Severe depression   Psychosocial Evaluation and Intervention:   Psychosocial Re-Evaluation:   Psychosocial Discharge (Final Psychosocial Re-Evaluation):   Vocational Rehabilitation: Provide vocational rehab assistance to qualifying candidates.   Vocational Rehab Evaluation & Intervention: Vocational Rehab - 03/30/19 1513      Initial Vocational Rehab Evaluation & Intervention   Assessment shows need for Vocational Rehabilitation  No   already back to work      Education: Education Goals: Education classes will be provided on a variety of topics geared toward better understanding of heart health and risk factor modification. Participant will state understanding/return demonstration of topics presented as noted by education test scores.  Learning Barriers/Preferences: Learning Barriers/Preferences - 03/30/19 1513  Learning Barriers/Preferences   Learning Barriers  Sight   glasses for reading   Learning Preferences  Written Material       General Cardiac Education Topics:  AED/CPR: - Group verbal and written instruction with the use of models to demonstrate the basic use of the AED with the basic ABC's of resuscitation.   Anatomy & Physiology of the Heart: - Group verbal and written instruction and models provide basic cardiac anatomy and physiology, with the coronary electrical and arterial systems. Review of Valvular disease and Heart Failure   Cardiac Procedures: - Group verbal and written instruction to review commonly prescribed medications for heart disease. Reviews the medication, class of the drug, and side effects. Includes the steps to properly store meds and maintain the prescription regimen. (beta blockers and nitrates)   Cardiac Medications I: - Group verbal and written instruction to review commonly prescribed medications for heart disease. Reviews the medication, class of the drug,  and side effects. Includes the steps to properly store meds and maintain the prescription regimen.   Cardiac Medications II: -Group verbal and written instruction to review commonly prescribed medications for heart disease. Reviews the medication, class of the drug, and side effects. (all other drug classes)    Go Sex-Intimacy & Heart Disease, Get SMART - Goal Setting: - Group verbal and written instruction through game format to discuss heart disease and the return to sexual intimacy. Provides group verbal and written material to discuss and apply goal setting through the application of the S.M.A.R.T. Method.   Other Matters of the Heart: - Provides group verbal, written materials and models to describe Stable Angina and Peripheral Artery. Includes description of the disease process and treatment options available to the cardiac patient.   Infection Prevention: - Provides verbal and written material to individual with discussion of infection control including proper hand washing and proper equipment cleaning during exercise session.   Falls Prevention: - Provides verbal and written material to individual with discussion of falls prevention and safety.   Other: -Provides group and verbal instruction on various topics (see comments)   Knowledge Questionnaire Score:   Core Components/Risk Factors/Patient Goals at Admission:   Education:Diabetes - Individual verbal and written instruction to review signs/symptoms of diabetes, desired ranges of glucose level fasting, after meals and with exercise. Acknowledge that pre and post exercise glucose checks will be done for 3 sessions at entry of program.   Education: Know Your Numbers and Risk Factors: -Group verbal and written instruction about important numbers in your health.  Discussion of what are risk factors and how they play a role in the disease process.  Review of Cholesterol, Blood Pressure, Diabetes, and BMI and the role they  play in your overall health.   Core Components/Risk Factors/Patient Goals Review:    Core Components/Risk Factors/Patient Goals at Discharge (Final Review):    ITP Comments: ITP Comments    Row Name 03/30/19 1536 05/04/19 1206         ITP Comments  Completed virtual intake appt today.  HE review scheduled for next Tuesday at [facility] has not returned our calls or emails for virtual rehab. We will discharge her at this time.         Comments: Discharge ITP

## 2019-05-05 ENCOUNTER — Encounter: Payer: Self-pay | Admitting: Internal Medicine

## 2019-05-31 ENCOUNTER — Ambulatory Visit (INDEPENDENT_AMBULATORY_CARE_PROVIDER_SITE_OTHER): Payer: Self-pay | Admitting: Primary Care

## 2020-05-09 ENCOUNTER — Other Ambulatory Visit: Payer: Self-pay

## 2020-05-09 ENCOUNTER — Ambulatory Visit: Payer: MEDICAID | Attending: Critical Care Medicine

## 2020-05-09 DIAGNOSIS — Z23 Encounter for immunization: Secondary | ICD-10-CM

## 2020-05-09 NOTE — Progress Notes (Signed)
   Covid-19 Vaccination Clinic  Name:  Rian Koon    MRN: [id number] DOB: 11/19/1970  05/09/2020  Ms. Benedick was observed post Covid-19 immunization for 15 minutes without incident. She was provided with Vaccine Information Sheet and instruction to access the V-Safe system.   Ms. Maxham was instructed to call 911 with any severe reactions post vaccine: [name] Difficulty breathing  . Swelling of face and throat  . A fast heartbeat  . A bad rash all over body  . Dizziness and weakness   Immunizations Administered    Name Date Dose VIS Date Route   Moderna Covid-19 Booster Vaccine 05/09/2020 11:14 AM 0.25 mL 11/09/2019 Intramuscular   Manufacturer: Gala Murdoch   Lot: [id number]   NDC: 91916-606-00

## 2021-05-06 ENCOUNTER — Other Ambulatory Visit: Payer: Self-pay

## 2021-05-06 ENCOUNTER — Emergency Department (HOSPITAL_COMMUNITY)
Admission: EM | Admit: 2021-05-06 | Discharge: 2021-05-06 | Disposition: A | Discharge status: Home / Self Care | Payer: Self-pay | Attending: Emergency Medicine | Admitting: Emergency Medicine

## 2021-05-06 ENCOUNTER — Emergency Department (HOSPITAL_COMMUNITY): Payer: Self-pay

## 2021-05-06 ENCOUNTER — Encounter (HOSPITAL_COMMUNITY): Payer: Self-pay | Admitting: Emergency Medicine

## 2021-05-06 DIAGNOSIS — M25562 Pain in left knee: Secondary | ICD-10-CM | POA: Insufficient documentation

## 2021-05-06 DIAGNOSIS — M25561 Pain in right knee: Secondary | ICD-10-CM | POA: Insufficient documentation

## 2021-05-06 DIAGNOSIS — W19XXXA Unspecified fall, initial encounter: Secondary | ICD-10-CM

## 2021-05-06 DIAGNOSIS — W01198A Fall on same level from slipping, tripping and stumbling with subsequent striking against other object, initial encounter: Secondary | ICD-10-CM | POA: Insufficient documentation

## 2021-05-06 DIAGNOSIS — M25532 Pain in left wrist: Secondary | ICD-10-CM | POA: Insufficient documentation

## 2021-05-06 DIAGNOSIS — M545 Low back pain, unspecified: Secondary | ICD-10-CM | POA: Insufficient documentation

## 2021-05-06 MED ORDER — KETOROLAC TROMETHAMINE 60 MG/2ML IM SOLN
30.0000 mg | Freq: Once | INTRAMUSCULAR | Status: AC
Start: 2021-05-06 — End: 2021-05-06
  Administered 2021-05-06: 30 mg via INTRAMUSCULAR
  Filled 2021-05-06: qty 2

## 2021-05-06 NOTE — ED Provider Notes (Signed)
?[facility] ?Provider Note ? ? ?CSN: [id number] ?Arrival date & time: 05/06/21  1235 ? ?  ? ?History ? ?Chief Complaint  ?Patient presents with  ? Fall  ? ? ?Krista Alvarez is a 51 y.o. female. ? ? ?Fall ? ?Patient is a 51 year old female who presents to the emergency department after mechanical fall 2 days prior to arrival.  She reports she tripped and fell forward hitting her left wrist and bilateral knees.  She also report mild discomfort on her lower spine.  Denies any urinary incontinence or fecal incontinence.  She continued to have bilateral knee pain and wrist pain despite taking Tylenol and ibuprofen.  She reports he was not able to make it 2 days ago because she did not transportation.  Denies loss of consciousness or head injury.  Denies abdominal pain.  She is able to ambulate after the incident without any abnormality.  Denies any syncope.  Otherwise no other complaints.  ? ?Home Medications ?Prior to Admission medications   ?Medication Sig Start Date End Date Taking? Authorizing Provider  ?atorvastatin (LIPITOR) 80 MG tablet Take 1 tablet (80 mg total) by mouth daily at 6 PM. 03/03/19 04/02/19  Wyvonnia Dusky, MD  ?isosorbide mononitrate (IMDUR) 30 MG 24 hr tablet Take 0.5 tablets (15 mg total) by mouth daily. 03/31/19 04/30/19  End, Harrell Gave, MD  ?losartan (COZAAR) 25 MG tablet Take 1 tablet (25 mg total) by mouth daily. 03/17/19 06/15/19  Marrianne Mood D, PA-C  ?Melatonin 5 MG TABS Take 1 tablet (5 mg total) by mouth at bedtime and may repeat dose one time if needed. 03/29/19   Kerin Perna, NP  ?metFORMIN (GLUCOPHAGE) 1000 MG tablet Take 1 tablet (1,000 mg total) by mouth 2 (two) times daily with a meal. 03/29/19   Kerin Perna, NP  ?metoprolol tartrate (LOPRESSOR) 25 MG tablet Take 1 tablet (25 mg total) by mouth 2 (two) times daily. 03/31/19 04/30/19  End, Harrell Gave, MD  ?nitroGLYCERIN (NITROSTAT) 0.4 MG SL tablet Place 1 tablet (0.4 mg  total) under the tongue every 5 (five) minutes as needed for chest pain. 03/17/19   Marrianne Mood D, PA-C  ?   ? ?Allergies    ?Hydrocodone-acetaminophen and Ibuprofen   ? ?Review of Systems   ?Review of Systems ? ?Physical Exam ?Updated Vital Signs ?BP (!) 143/87   Pulse 69   Temp (!) 97.3 ?F (36.3 ?C) (Oral)   Resp 20   LMP 09/08/2017 (Approximate)   SpO2 98%  ?Physical Exam ?Constitutional:   ?   General: She is not in acute distress. ?   Appearance: She is normal weight.  ?HENT:  ?   Head: Normocephalic.  ?   Nose: Nose normal. No congestion.  ?   Mouth/Throat:  ?   Mouth: Mucous membranes are moist.  ?   Pharynx: Oropharynx is clear.  ?Eyes:  ?   Extraocular Movements: Extraocular movements intact.  ?   Pupils: Pupils are equal, round, and reactive to light.  ?Cardiovascular:  ?   Rate and Rhythm: Normal rate.  ?Pulmonary:  ?   Effort: No respiratory distress.  ?   Breath sounds: No wheezing.  ?Abdominal:  ?   General: Abdomen is flat.  ?   Tenderness: There is no abdominal tenderness. There is no guarding or rebound.  ?Musculoskeletal:     ?   General: Tenderness present.  ?   Cervical back: Normal range of motion. No rigidity or tenderness.  ?  Comments: Tenderness of the left wrist.  Neurovascular intact.  No obvious deformity.  No tenderness on bilateral knees.  Range of motion intact.  Distal neurovascular intact.  ?Skin: ?   General: Skin is warm.  ?   Capillary Refill: Capillary refill takes less than 2 seconds.  ?Neurological:  ?   General: No focal deficit present.  ?   Mental Status: She is alert.  ?   Sensory: No sensory deficit.  ?   Motor: No weakness.  ? ? ?ED Results / Procedures / Treatments   ?Labs ?(all labs ordered are listed, but only abnormal results are displayed) ?Labs Reviewed  ?POC URINE PREG, ED  ? ? ?EKG ?None ? ?Radiology ?DG Lumbar Spine Complete ? ?Result Date: 05/06/2021 ?CLINICAL DATA:  Low back pain after fall. EXAM: LUMBAR SPINE - COMPLETE 4+ VIEW COMPARISON:  April 22, 2007 FINDINGS: There is no evidence of lumbar spine fracture. Alignment is normal. Intervertebral disc spaces are maintained. Mild anterior osteophyte formation is noted at L3-4 and L4-5. IMPRESSION: Mild multilevel degenerative changes are noted. No acute abnormality is noted. Electronically Signed   By: Marijo Conception M.D.   On: 05/06/2021 14:51  ? ?DG Wrist Complete Left ? ?Result Date: 05/06/2021 ?CLINICAL DATA:  Pain after fall EXAM: LEFT WRIST - COMPLETE 3+ VIEW COMPARISON:  None. FINDINGS: Internal fixation of the fourth and fifth metacarpals. No distal radius or ulnar fracture. Radiocarpal joint is intact. No carpal fracture. No soft tissue abnormality. IMPRESSION: No fracture or dislocation. Electronically Signed   By: Suzy Bouchard M.D.   On: 05/06/2021 14:58  ? ?DG Knee Complete 4 Views Left ? ?Result Date: 05/06/2021 ?CLINICAL DATA:  Left knee pain after fall. EXAM: LEFT KNEE - COMPLETE 4+ VIEW COMPARISON:  None. FINDINGS: No evidence of fracture, dislocation, or joint effusion. No evidence of arthropathy or other focal bone abnormality. Soft tissues are unremarkable. IMPRESSION: Negative. Electronically Signed   By: Marijo Conception M.D.   On: 05/06/2021 14:50  ? ?DG Knee Complete 4 Views Right ? ?Result Date: 05/06/2021 ?CLINICAL DATA:  Right knee pain after fall. EXAM: RIGHT KNEE - COMPLETE 4+ VIEW COMPARISON:  None. FINDINGS: No evidence of fracture, dislocation, or joint effusion. No evidence of arthropathy or other focal bone abnormality. Soft tissues are unremarkable. IMPRESSION: Negative. Electronically Signed   By: Marijo Conception M.D.   On: 05/06/2021 14:49   ? ?Procedures ?Procedures  ? ?Medications Ordered in ED ?Medications  ?ketorolac (TORADOL) injection 30 mg (30 mg Intramuscular Given 05/06/21 1819)  ? ? ?ED Course/ Medical Decision Making/ A&P ?  ?                        ?Medical Decision Making ?Problems Addressed: ?Fall, initial encounter: acute illness or injury that poses a threat to  life or bodily functions ? ?Amount and/or Complexity of Data Reviewed ?Radiology: ordered and independent interpretation performed. Decision-making details documented in ED Course. ? ?Risk ?Prescription drug management. ? ? ?Patient is a 51 year old female presents to the emergency department after mechanical fall and complaining about left wrist pain, bilateral knee pain and lower spine discomfort.  Patient vital signs within the reference range except for mildly elevated blood pressure.  Physical exam without focal neurological deficit.  No acute abdomen on my exam.  There is very mild tenderness on the left wrist.  However patient is neurovascular intact with good strength bilaterally in upper and lower extremity.  She was able to be without gait abnormality. ? ?Patient presentation most consistent with soft tissue injury.  At this point less likely to be a fracture or dislocation.  In regards to the left wrist, cannot rule out snuffbox injury.  Less concern for intracranial etiology.  No head trauma or loss of consciousness.  Patient has also been ambulating without any focal weakness for the past 2 days prior to presenting today. ? ?Plain imaging of the bilateral knees, left wrist, and spine did not show any acute etiology.  Her lower spine plain imaging did show mild degenerative disease which are more likely to be chronic. ? ?Intervention: I have given her Toradol IM 30 mg for pain management.  In addition, we will place a Velcro wrist splint on the left and will provide information to follow-up with orthopedics as an outpatient. ? ?I have discussed strict return precaution.  Recommend follow-up with PCP in 2 to 3 days.  In addition I have discussed conservative pain management with Tylenol and ibuprofen alternating. ? ? ?Final Clinical Impression(s) / ED Diagnoses ?Final diagnoses:  ?Fall, initial encounter  ? ? ?Rx / DC Orders ?ED Discharge Orders   ? ? None  ? ?  ? ? ?  ?Donnamarie Poag, MD ?05/06/21  1835 ? ?  ?Drenda Freeze, MD ?05/06/21 2326 ? ?

## 2021-05-06 NOTE — ED Triage Notes (Addendum)
Pt states she tripped over a table "in the middle of the floor" at [employer] on Saturday.  Reports pain to L wrist, L knee, and lower back. Denies LOC. ?

## 2021-05-06 NOTE — Discharge Instructions (Addendum)
Continue to take Tylenol and ibuprofen alternating for pain management.  Your x-ray did not show any fractures or acute dislocation. ? ?I have given you the phone number for orthopedics for follow-up.  You will require repeat imaging of your left wrist to assess for delayed etiology. ? ?Follow-up with your primary care provider in 2 to 3 days. ?

## 2021-05-06 NOTE — ED Provider Triage Note (Signed)
Emergency Medicine Provider Triage Evaluation Note ? ?Krista Alvarez , a 51 y.o. female  was evaluated in triage.  Pt complains of mechanical fall that occurred on Saturday at [facility].  Patient states that there was a table in the middle of the floor that she tripped over.  She landed on her left outstretched wrist and her bilateral knees.  Complains of pain to same as well as lower back.  No head injury loss consciousness.  Has been taking Tylenol without relief. ? ?Review of Systems  ?Positive: + L wrist pain, bilateral knee pain, back pain ?Negative: - head injury, LOC ? ?Physical Exam  ?BP (!) 148/84 (BP Location: Left Arm)   Pulse 79   Temp (!) 97.3 ?F (36.3 ?C) (Oral)   Resp 17   SpO2 96%  ?Gen:   Awake, no distress   ?Resp:  Normal effort  ?MSK:   Moves extremities without difficulty  ?Other:  Mild swelling noted to L wrist with TTP. 2+ radial pulse. ROM intact. Cap refill < 2 seconds to all digits on left hand.  ?+ TTP to bilateral knees. L knee with crepitus. + TTP to midline L spine.  ? ?Medical Decision Making  ?Medically screening exam initiated at 1:32 PM.  Appropriate orders placed.  Tiffiany Beadles was informed that the remainder of the evaluation will be completed by another provider, this initial triage assessment does not replace that evaluation, and the importance of remaining in the ED until their evaluation is complete. ? ? ?  ?Tanda Rockers, PA-C ?05/06/21 1333 ? ?

## 2021-05-06 NOTE — ED Notes (Signed)
Patient verbalizes understanding of discharge instructions. Opportunity for questioning and answers were provided. Armband removed by staff, pt discharged from ED.  

## 2021-05-06 NOTE — Progress Notes (Signed)
Orthopedic Tech Progress Note ?Patient Details:  ?Krista Alvarez ?1970-03-27 ?[id number] ? ?Ortho Devices ?Type of Ortho Device: Velcro wrist splint ?Ortho Device/Splint Location: LUE ?Ortho Device/Splint Interventions: Ordered, Application, Adjustment ?  ?Post Interventions ?Patient Tolerated: Well ?Instructions Provided: Care of device ? ?Donald Pore ?05/06/2021, 6:34 PM ? ?

## 2022-05-22 DIAGNOSIS — H2521 Age-related cataract, morgagnian type, right eye: Secondary | ICD-10-CM | POA: Diagnosis not present

## 2022-05-22 DIAGNOSIS — H25812 Combined forms of age-related cataract, left eye: Secondary | ICD-10-CM | POA: Diagnosis not present

## 2022-05-26 DIAGNOSIS — H25812 Combined forms of age-related cataract, left eye: Secondary | ICD-10-CM | POA: Diagnosis not present

## 2022-05-26 DIAGNOSIS — H2521 Age-related cataract, morgagnian type, right eye: Secondary | ICD-10-CM | POA: Diagnosis not present

## 2022-05-30 DIAGNOSIS — H2589 Other age-related cataract: Secondary | ICD-10-CM | POA: Diagnosis not present

## 2022-09-21 IMAGING — DX DG WRIST COMPLETE 3+V*L*
4 series · 4 of 4 positions shown · non-contrast
Comparison: None.

CLINICAL DATA: Pain after fall

EXAM:
LEFT WRIST - COMPLETE 3+ VIEW

[wrist pa]
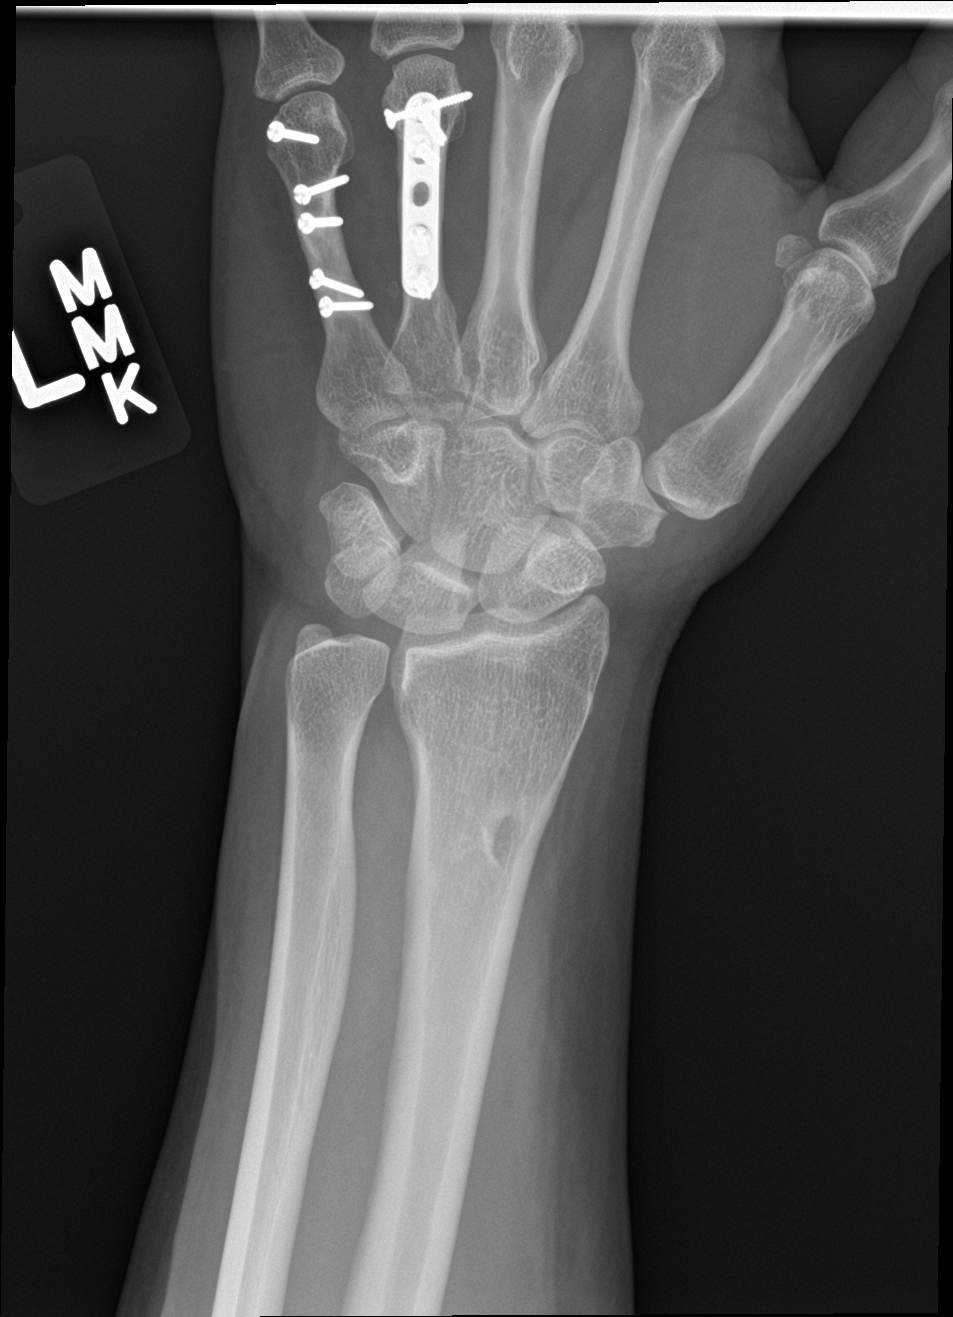

[wrist obl]
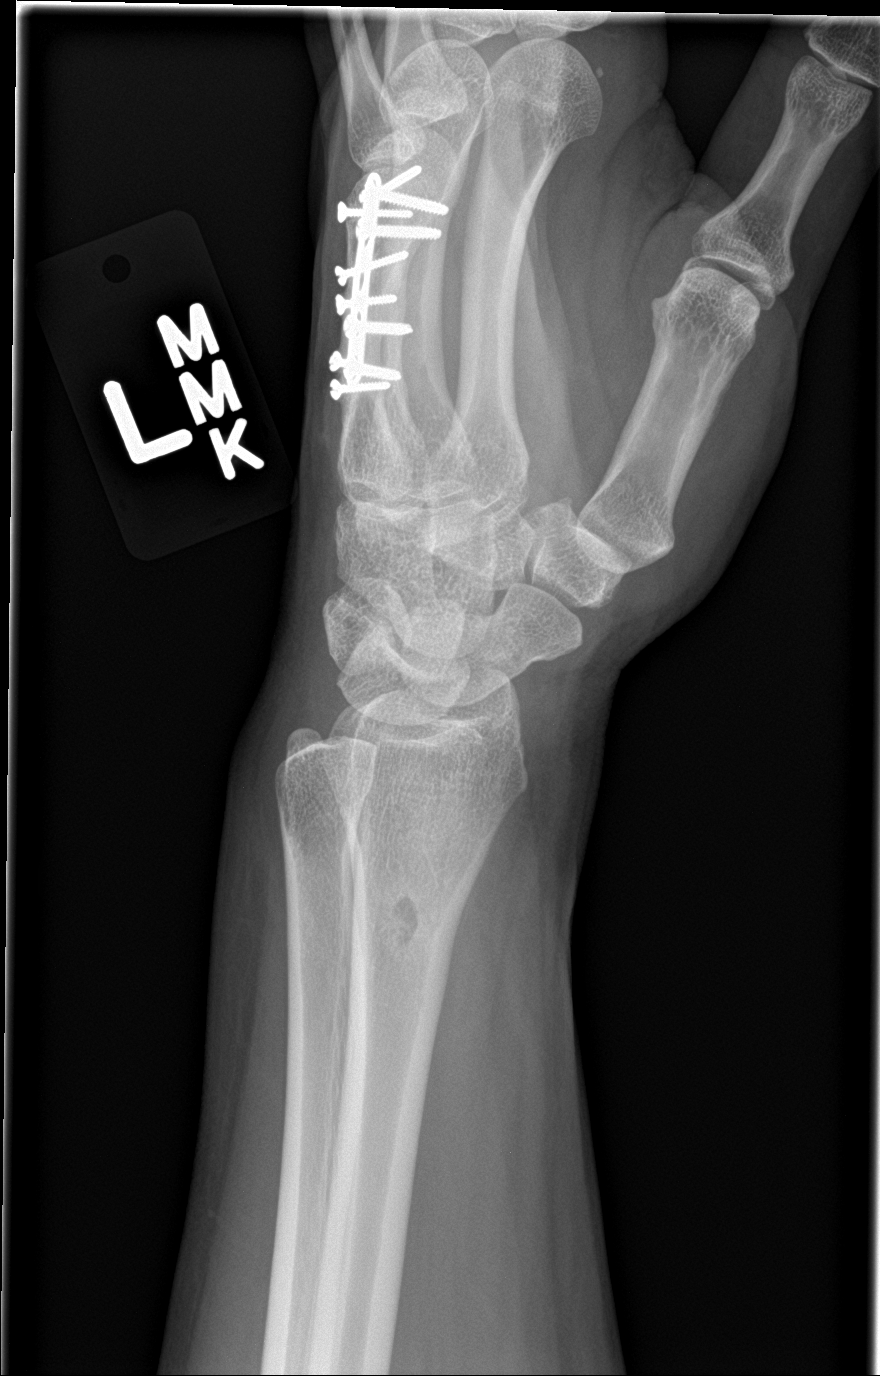

[wrist lat]
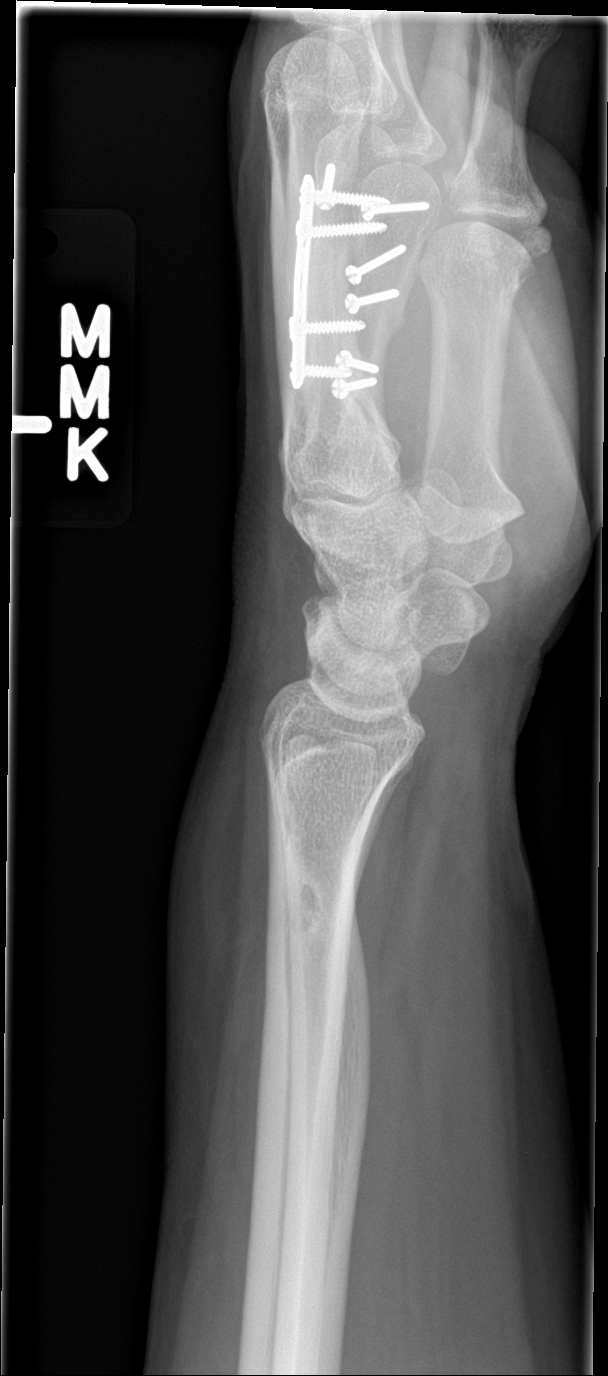

[wrist navicular]
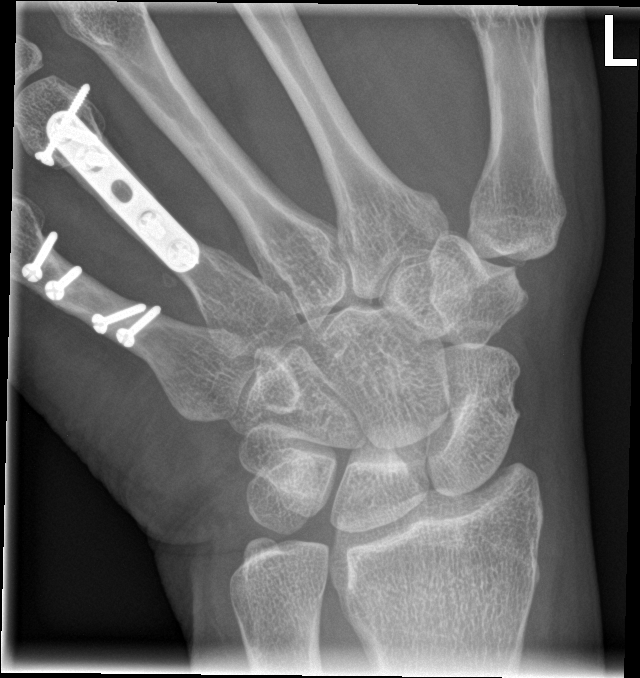

[4 of 4 positions shown; findings below may reference images not displayed]

FINDINGS: Internal fixation of the fourth and fifth metacarpals. No distal
radius or ulnar fracture. Radiocarpal joint is intact. No carpal
fracture. No soft tissue abnormality.
IMPRESSION: No fracture or dislocation.

## 2022-09-21 IMAGING — DX DG LUMBAR SPINE COMPLETE 4+V
6 series · 6 of 6 positions shown · non-contrast
Comparison: April 22, 2007

CLINICAL DATA: Low back pain after fall.

EXAM:
LUMBAR SPINE - COMPLETE 4+ VIEW

[l-spine ap]
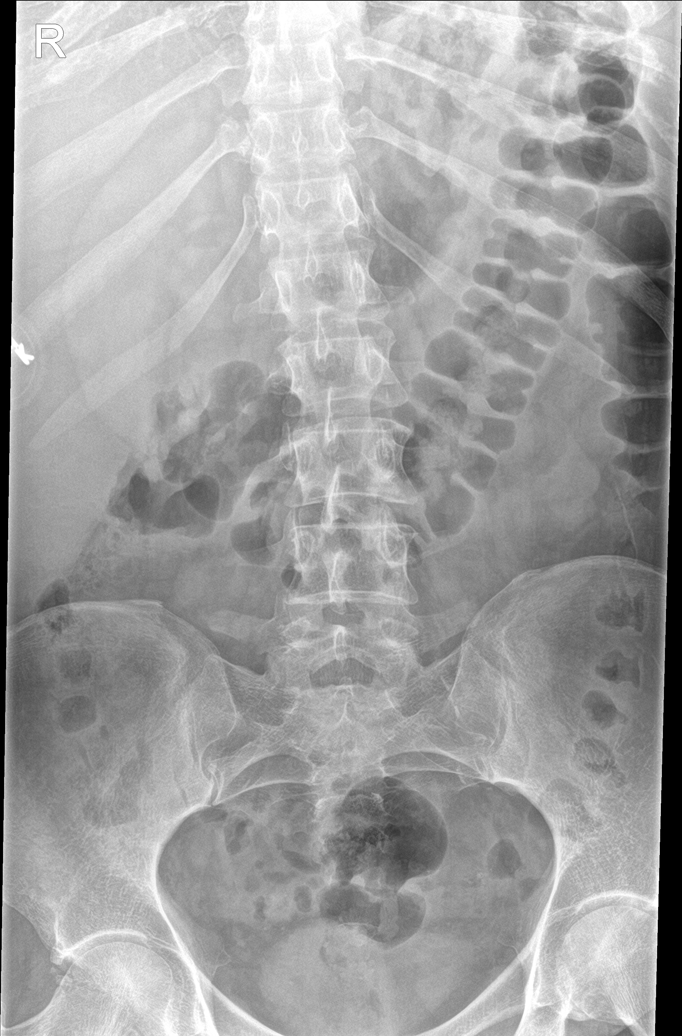

[l-spine obl (1 of 3)]
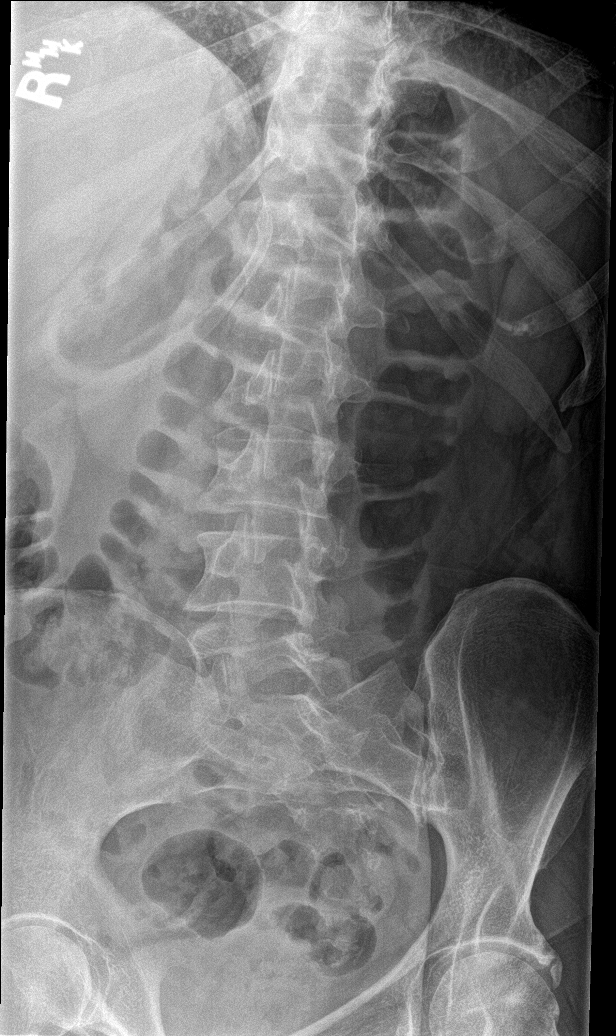

[l-spine obl (2 of 3)]
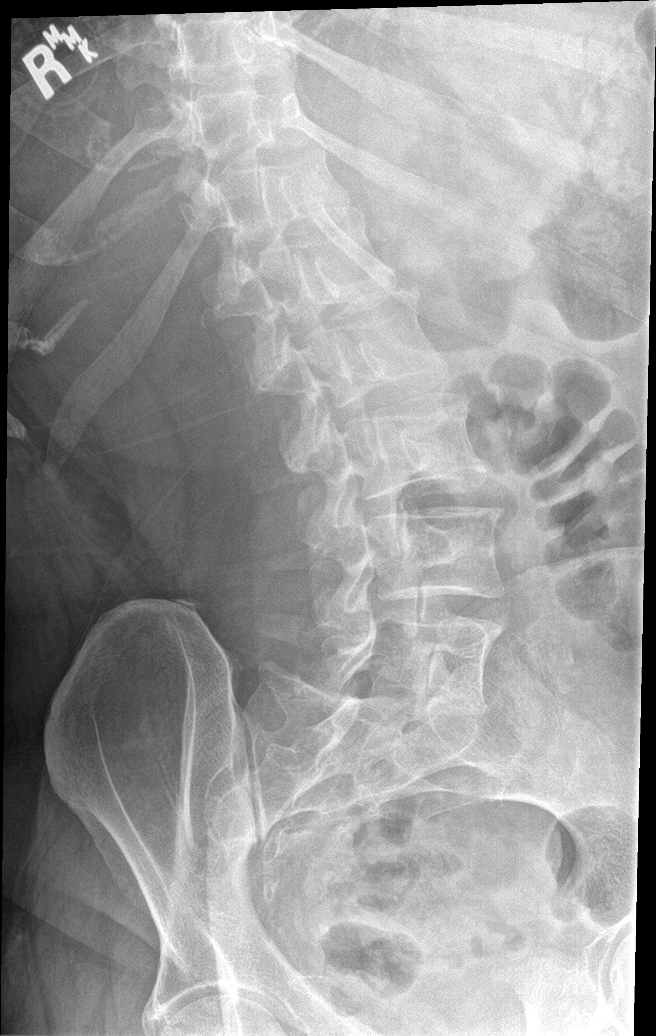

[l-spine lat]
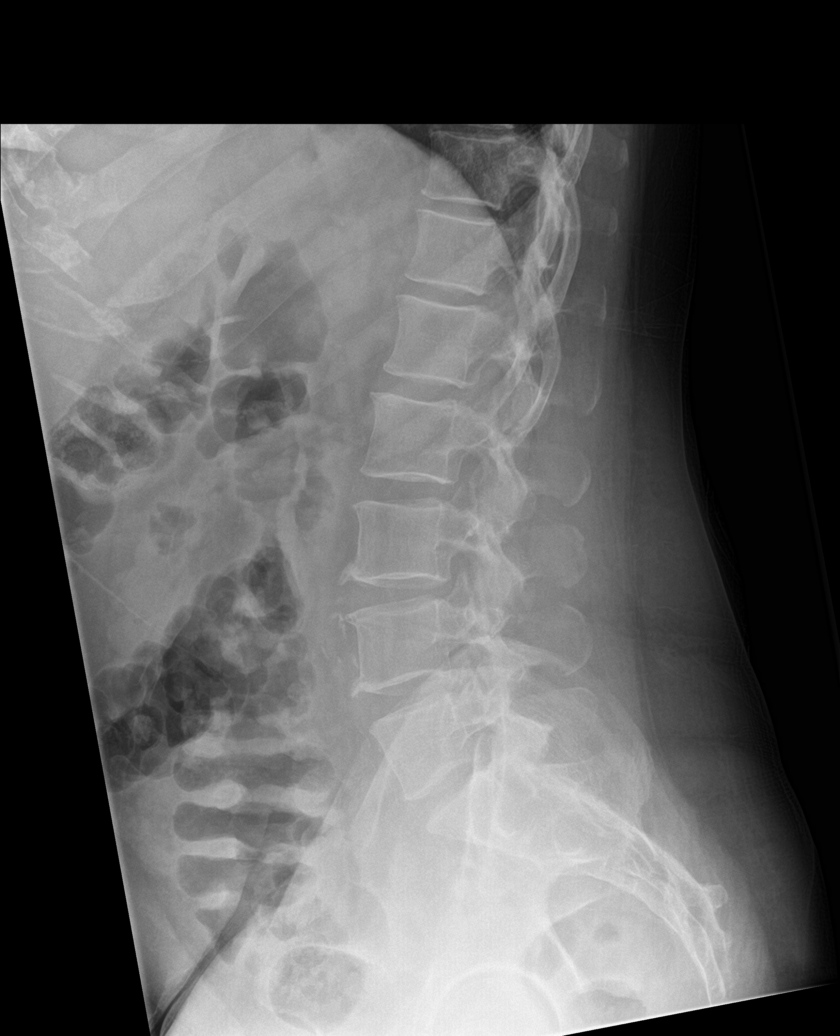

[l-spine spot]
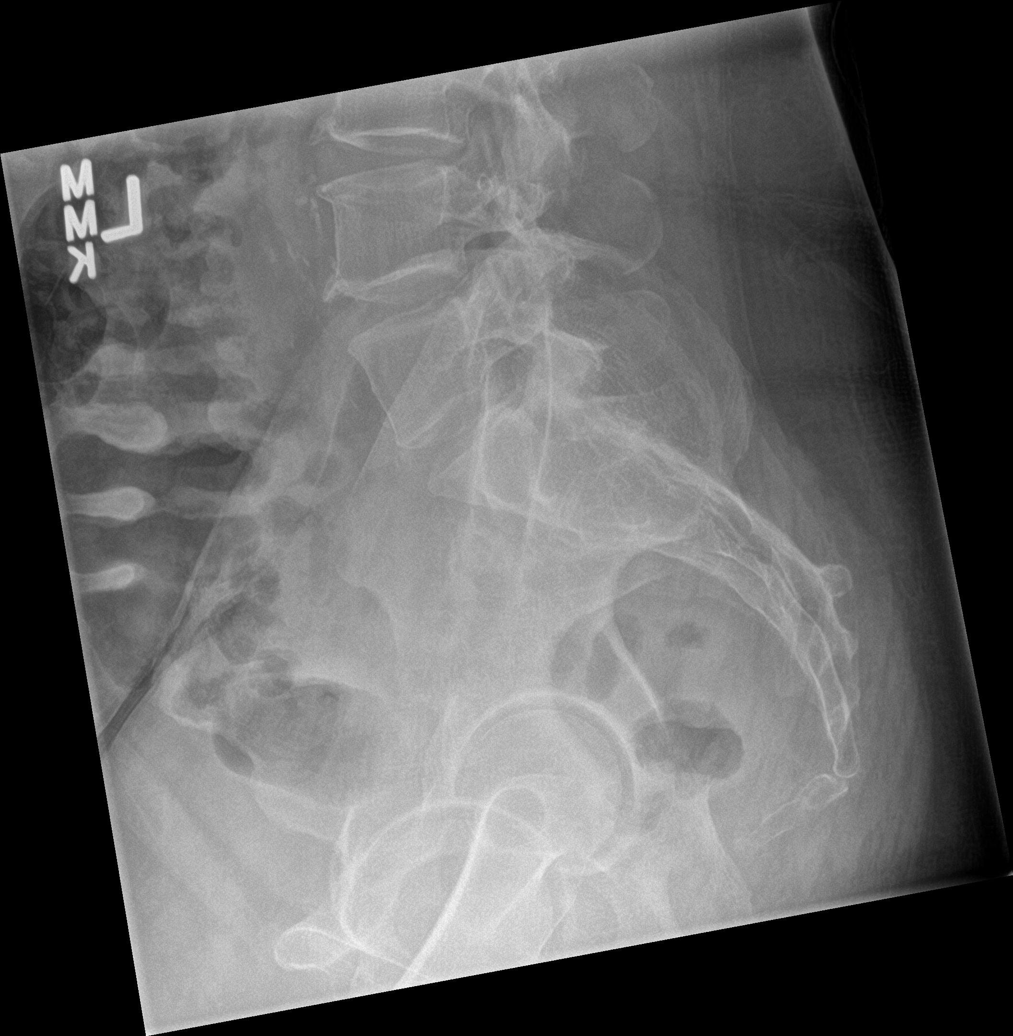

[l-spine obl (3 of 3)]
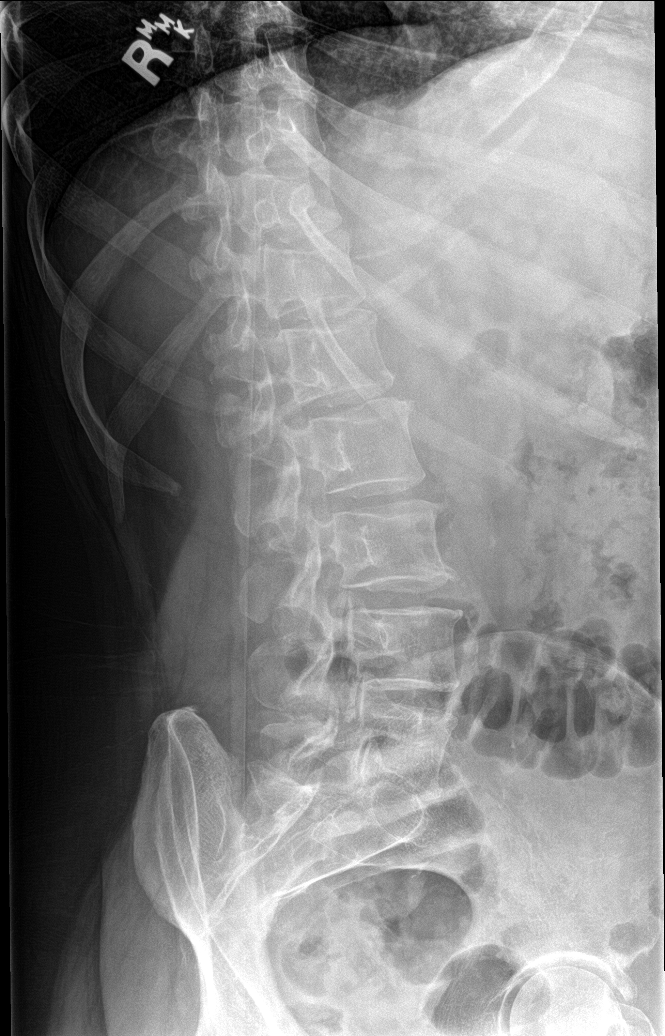

[6 of 6 positions shown; findings below may reference images not displayed]

FINDINGS: There is no evidence of lumbar spine fracture. Alignment is normal.
Intervertebral disc spaces are maintained. Mild anterior osteophyte
formation is noted at L3-4 and L4-5.
IMPRESSION: Mild multilevel degenerative changes are noted. No acute abnormality
is noted.

## 2024-01-06 ENCOUNTER — Encounter: Payer: Self-pay | Admitting: Nurse Practitioner

## 2024-01-06 ENCOUNTER — Ambulatory Visit: Admitting: Nurse Practitioner

## 2024-01-06 ENCOUNTER — Other Ambulatory Visit: Payer: Self-pay

## 2024-01-06 VITALS — BP 130/62 | HR 78 | Temp 97.7°F | Ht 62.0 in | Wt 135.2 lb

## 2024-01-06 DIAGNOSIS — Z23 Encounter for immunization: Secondary | ICD-10-CM | POA: Diagnosis not present

## 2024-01-06 DIAGNOSIS — I10 Essential (primary) hypertension: Secondary | ICD-10-CM

## 2024-01-06 DIAGNOSIS — E118 Type 2 diabetes mellitus with unspecified complications: Secondary | ICD-10-CM | POA: Diagnosis not present

## 2024-01-06 DIAGNOSIS — Z1231 Encounter for screening mammogram for malignant neoplasm of breast: Secondary | ICD-10-CM

## 2024-01-06 DIAGNOSIS — F419 Anxiety disorder, unspecified: Secondary | ICD-10-CM | POA: Diagnosis not present

## 2024-01-06 DIAGNOSIS — J449 Chronic obstructive pulmonary disease, unspecified: Secondary | ICD-10-CM

## 2024-01-06 DIAGNOSIS — Z126 Encounter for screening for malignant neoplasm of bladder: Secondary | ICD-10-CM

## 2024-01-06 DIAGNOSIS — I2510 Atherosclerotic heart disease of native coronary artery without angina pectoris: Secondary | ICD-10-CM | POA: Diagnosis not present

## 2024-01-06 DIAGNOSIS — E785 Hyperlipidemia, unspecified: Secondary | ICD-10-CM | POA: Diagnosis not present

## 2024-01-06 DIAGNOSIS — Z720 Tobacco use: Secondary | ICD-10-CM | POA: Diagnosis not present

## 2024-01-06 DIAGNOSIS — I200 Unstable angina: Secondary | ICD-10-CM

## 2024-01-06 DIAGNOSIS — F32A Depression, unspecified: Secondary | ICD-10-CM | POA: Diagnosis not present

## 2024-01-06 DIAGNOSIS — Z122 Encounter for screening for malignant neoplasm of respiratory organs: Secondary | ICD-10-CM

## 2024-01-06 DIAGNOSIS — Z1211 Encounter for screening for malignant neoplasm of colon: Secondary | ICD-10-CM

## 2024-01-06 LAB — POCT GLYCOSYLATED HEMOGLOBIN (HGB A1C): Hemoglobin A1C: 6 % — AB (ref 4.0–5.6)

## 2024-01-06 MED ORDER — ALBUTEROL SULFATE HFA 108 (90 BASE) MCG/ACT IN AERS
2.0000 | INHALATION_SPRAY | Freq: Four times a day (QID) | RESPIRATORY_TRACT | 0 refills | Status: AC | PRN
  Filled 2024-01-06: qty 6.7, 25d supply, fill #0

## 2024-01-06 MED ORDER — BUDESONIDE-FORMOTEROL FUMARATE 160-4.5 MCG/ACT IN AERO
2.0000 | INHALATION_SPRAY | Freq: Two times a day (BID) | RESPIRATORY_TRACT | 3 refills | Status: AC
  Filled 2024-01-06: qty 10.2, 30d supply, fill #0

## 2024-01-06 MED ORDER — ATORVASTATIN CALCIUM 40 MG PO TABS
40.0000 mg | ORAL_TABLET | Freq: Every day | ORAL | 0 refills | Status: AC
  Filled 2024-01-06: qty 90, 90d supply, fill #0

## 2024-01-06 MED ORDER — NITROGLYCERIN 0.4 MG SL SUBL
0.4000 mg | SUBLINGUAL_TABLET | SUBLINGUAL | 3 refills | Status: AC | PRN
  Filled 2024-01-06: qty 25, 9d supply, fill #0

## 2024-01-06 MED ORDER — SERTRALINE HCL 50 MG PO TABS
ORAL_TABLET | ORAL | 0 refills | Status: AC
  Filled 2024-01-06: qty 25, 30d supply, fill #0

## 2024-01-06 NOTE — Assessment & Plan Note (Signed)
 History of the same with 30 pack years worth of smoking.  Patient states she started smoking when she was approximately 53 years old and does a pack a day.  History of using inhalers in the past with good relief.  Patient does have rhonchi and wheezing today will start patient on Symbicort  and albuterol  as needed.  Ambulatory referral to LDCT for lung cancer screening

## 2024-01-06 NOTE — Addendum Note (Signed)
 Addended by: SEBASTIAN SHU on: 01/06/2024 04:14 PM   Modules accepted: Orders

## 2024-01-06 NOTE — Assessment & Plan Note (Signed)
 Likely secondary to caregiver stress.  Will start patient on sertraline  25 mg daily for 10 days then increase to 50 mg thereafter.  Patient denies HI/SI/AVH.  Patient has tried Lexapro  in the past that induced suicidal ideations.

## 2024-01-06 NOTE — Progress Notes (Signed)
 New Patient Office Visit  Subjective    Patient ID: Krista Alvarez, female    DOB: 1970-11-19  Age: 53 y.o. MRN: [id number]  CC:  Chief Complaint  Patient presents with   Establish Care    Pt complains of need for referral to cardiologist. States of having 4 heart attacks.     HPI Krista Alvarez presents to establish care   DM2: states that she does not check her sugars at home. Has tried metoformin and done well  CAD/MI: has hx of stents and MI has not been seen in a few years by cardiology. She mentions that a stent has moved but did not need correction. She has had an episode of angina while moving her husband. She took a break and the pain did not go away. She did take a friends nitroglycerin  and that alleviated the pain.  Previously she was on statin therapy long-acting nitrite and beta-blocker.  COPD: states that she does not do well with breathing. States that she will have DOE. States that she was on an inhaler and albuterol  in the past. Has not had LDCT. She does currently smoke  Skin lesion: states that I thas been there for a couple weeks. Hurts and beside the spine.  Has not tried any otc treatments.  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 States that she is more irritabliy. Dont sleep well. States that she has trouble getting and staying asleep. States that she is having trouble cutting her mind off. States that she does not have HI/SI. AVH. She has tried lexapro  in the past that she did not do well and made her SI.   Numbness: states that she has numbness o nt he lower spine area and did nerve testing.  Patient states they had no follow-up or recommendations  Colonoscopy: cologuard  Mammogram: [place]  DEXA: Too young Pap smear: over due  LDCT: order today   Tdap: 2017 Flu: up date today  Covid: orginal Pna: up date today  Shingles: Discussed in office Outpatient  Encounter Medications as of 01/06/2024  Medication Sig   albuterol  (VENTOLIN  HFA) 108 (90 Base) MCG/ACT inhaler Inhale 2 puffs into the lungs every 6 (six) hours as needed for wheezing or shortness of breath.   atorvastatin  (LIPITOR ) 40 MG tablet Take 1 tablet (40 mg total) by mouth daily.   budesonide -formoterol  (SYMBICORT ) 160-4.5 MCG/ACT inhaler Inhale 2 puffs into the lungs 2 (two) times daily. Rinse mouth out after each use   nitroGLYCERIN  (NITROSTAT ) 0.4 MG SL tablet Place 1 tablet (0.4 mg total) under the tongue every 5 (five) minutes as needed for chest pain.   sertraline  (ZOLOFT ) 50 MG tablet Take 0.5 tablets (25 mg total) by mouth daily for 10 days, THEN 1 tablet (50 mg total) daily for 20 days.   [DISCONTINUED] atorvastatin  (LIPITOR ) 80 MG tablet Take 1 tablet (80 mg total) by mouth daily at 6 PM.   [DISCONTINUED] isosorbide  mononitrate (IMDUR ) 30 MG 24 hr tablet Take 0.5 tablets (15 mg total) by mouth daily.   [DISCONTINUED] losartan  (COZAAR ) 25 MG tablet Take 1 tablet (25 mg total) by mouth daily.   [DISCONTINUED] Melatonin 5 MG TABS Take 1 tablet (5 mg total) by mouth at bedtime and may repeat dose one time if needed.   [DISCONTINUED] metFORMIN  (GLUCOPHAGE ) 1000 MG tablet Take 1 tablet (1,000 mg total) by mouth 2 (two)  times daily with a meal.   [DISCONTINUED] metoprolol  tartrate (LOPRESSOR ) 25 MG tablet Take 1 tablet (25 mg total) by mouth 2 (two) times daily.   [DISCONTINUED] nitroGLYCERIN  (NITROSTAT ) 0.4 MG SL tablet Place 1 tablet (0.4 mg total) under the tongue every 5 (five) minutes as needed for chest pain.   No facility-administered encounter medications on file as of 01/06/2024.    Past Medical History:  Diagnosis Date   Acute ST elevation myocardial infarction (STEMI) of inferior wall (HCC)    2018 07/23/16-STEMI PCI -->DES to [facility]    Anxiety    Arthritis    hips (08/30/2014)   Asthma    CHF (congestive heart failure) (HCC)    Chronic lower back pain    COPD  (chronic obstructive pulmonary disease) (HCC)    Coronary artery disease    07/23/16-STEMI PCI -->DES to RCA, Ef normal    Depression    DVT (deep venous thrombosis) (HCC)    GERD (gastroesophageal reflux disease)    Hypercholesterolemia    Hypertension    Migraine    a few times/month (08/30/2014)   NSTEMI (non-ST elevated myocardial infarction) (HCC) 08/2013   thelbert 09/17/2013   Pneumonia several times   Type II diabetes mellitus (HCC)     Past Surgical History:  Procedure Laterality Date   CARDIAC CATHETERIZATION     CARDIAC CATHETERIZATION N/A 08/31/2014   Procedure: Left Heart Cath and Coronary Angiography;  Surgeon: Lonni JONETTA Cash, MD;  Location: [facility] INVASIVE CV LAB;  Service: Cardiovascular;  Laterality: N/A;   CATARACT EXTRACTION Right 09/2023   CORONARY ANGIOPLASTY     CORONARY/GRAFT ACUTE MI REVASCULARIZATION N/A 07/23/2016   Procedure: Coronary/Graft Acute MI Revascularization;  Surgeon: Dann Candyce RAMAN, MD;  Location: [facility] INVASIVE CV LAB;  Service: Cardiovascular;  Laterality: N/A;   LEFT HEART CATH AND CORONARY ANGIOGRAPHY N/A 07/23/2016   Procedure: Left Heart Cath and Coronary Angiography;  Surgeon: Dann Candyce RAMAN, MD;  Location: [facility] INVASIVE CV LAB;  Service: Cardiovascular;  Laterality: N/A;   LEFT HEART CATH AND CORONARY ANGIOGRAPHY N/A 03/03/2019   Procedure: LEFT HEART CATH AND CORONARY ANGIOGRAPHY;  Surgeon: Perla Evalene PARAS, MD;  Location: [facility] INVASIVE CV LAB;  Service: Cardiovascular;  Laterality: N/A;   PERCUTANEOUS PINNING PHALANX FRACTURE OF HAND Left    plate & pins   TONSILLECTOMY     UPPER EXTREMITY ANGIOGRAPHY Left 06/26/2017   Procedure: UPPER EXTREMITY ANGIOGRAPHY;  Surgeon: Serene Gaile ORN, MD;  Location: [facility] INVASIVE CV LAB;  Service: Cardiovascular;  Laterality: Left;  TPA administration single bolus    Family History  Problem Relation Age of Onset   Heart attack Mother    Heart attack Father     Social History    Socioeconomic History   Marital status: Married    Spouse name: Marcey   Number of children: 1   Years of education: Not on file   Highest education level: Not on file  Occupational History   Not on file  Tobacco Use   Smoking status: Every Day    Current packs/day: 1.00    Average packs/day: 1 pack/day for 30.0 years (30.0 ttl pk-yrs)    Types: Cigarettes   Smokeless tobacco: Never  Vaping Use   Vaping status: Never Used  Substance and Sexual Activity   Alcohol use: Yes    Comment: occasional use   Drug use: No    Types: Marijuana    Comment: back in my younger days   Sexual activity: Yes  Other Topics Concern   Not on file  Social History Narrative   Use to work at [employer] and takes [employer] full time    Social Drivers of Health   Tobacco Use: High Risk (01/06/2024)   Patient History    Smoking Tobacco Use: Every Day    Smokeless Tobacco Use: Never    Passive Exposure: Not on file  Financial Resource Strain: Not on file  Food Insecurity: Not on file  Transportation Needs: Not on file  Physical Activity: Not on file  Stress: Not on file  Social Connections: Not on file  Intimate Partner Violence: Not on file  Depression (EYV7-0): Not on file  Alcohol Screen: Not on file  Housing: Not on file  Utilities: Not on file  Health Literacy: Not on file    Review of Systems  Constitutional:  Negative for chills and fever.  Respiratory:  Positive for shortness of breath.   Cardiovascular:  Negative for chest pain.  Gastrointestinal:  Negative for abdominal pain, constipation, diarrhea, nausea and vomiting.       Bm daily to every other day   Genitourinary:  Negative for dysuria and hematuria.  Neurological:  Negative for dizziness and headaches.  Psychiatric/Behavioral:  Negative for hallucinations and suicidal ideas. The patient has insomnia.         Objective    BP 130/62   Pulse 78   Temp 97.7 F (36.5 C) (Oral)   Ht 5' 2 (1.575 m)   Wt 135 lb 3.2 oz  (61.3 kg)   LMP 09/08/2017   SpO2 98%   BMI 24.73 kg/m   Physical Exam Vitals and nursing note reviewed.  Constitutional:      Appearance: Normal appearance.  HENT:     Right Ear: Tympanic membrane, ear canal and external ear normal.     Left Ear: Tympanic membrane, ear canal and external ear normal.     Mouth/Throat:     Mouth: Mucous membranes are moist.     Pharynx: Oropharynx is clear.  Eyes:     Extraocular Movements: Extraocular movements intact.     Pupils: Pupils are equal, round, and reactive to light.  Cardiovascular:     Rate and Rhythm: Normal rate and regular rhythm.     Pulses: Normal pulses.     Heart sounds: Normal heart sounds.  Pulmonary:     Effort: Pulmonary effort is normal.     Breath sounds: Wheezing and rhonchi present.  Musculoskeletal:     Right lower leg: No edema.     Left lower leg: No edema.  Lymphadenopathy:     Cervical: No cervical adenopathy.  Skin:    General: Skin is warm.  Neurological:     General: No focal deficit present.     Mental Status: She is alert.     Deep Tendon Reflexes:     Reflex Scores:      Bicep reflexes are 2+ on the right side and 2+ on the left side.      Patellar reflexes are 2+ on the right side and 2+ on the left side.    Comments: Bilateral upper and lower extremity strength 5/5  Psychiatric:        Mood and Affect: Mood normal.        Behavior: Behavior normal.        Thought Content: Thought content normal.        Judgment: Judgment normal.     Title   Diabetic Foot Exam -  detailed Is there a history of foot ulcer?: No Is there a foot ulcer now?: No Is there swelling?: No Is there elevated skin temperature?: No Is there abnormal foot shape?: No Is there a claw toe deformity?: No Are the toenails long?: Yes Are the toenails thick?: No Are the toenails ingrown?: No Pulse Foot Exam completed.: Yes   Right Posterior Tibialis: Present Left posterior Tibialis: Present   Right Dorsalis Pedis:  Present Left Dorsalis Pedis: Present     Sensory Foot Exam Completed.: Yes Semmes-Weinstein Monofilament Test + means has sensation and - means no sensation      Image components are not supported.   Image components are not supported. Image components are not supported.  Tuning Fork Comments All 10 sites tested sensation intact bilaterally          Assessment & Plan:   Problem List Items Addressed This Visit       Cardiovascular and Mediastinum   CAD (coronary artery disease)   History of the same patient having some unstable angina.  Will place patient back on atorvastatin  40 mg.  Nitroglycerin  as needed urgent referral to cardiology.  Emergency parameters reviewed with patient      Relevant Medications   atorvastatin  (LIPITOR ) 40 MG tablet   nitroGLYCERIN  (NITROSTAT ) 0.4 MG SL tablet   Other Relevant Orders   Ambulatory referral to Cardiology   Hypertension - Primary   Relevant Medications   atorvastatin  (LIPITOR ) 40 MG tablet   nitroGLYCERIN  (NITROSTAT ) 0.4 MG SL tablet   Other Relevant Orders   CBC   Comprehensive metabolic panel with GFR   TSH   Unstable angina [facility])   Patient having unstable angina with history of PCI intervention.  Nitroglycerin  as needed prescribed urgent referral to cardiology.  Also put patient back on high intensity statin of atorvastatin  40 mg daily with a history of ASCVD      Relevant Medications   atorvastatin  (LIPITOR ) 40 MG tablet   nitroGLYCERIN  (NITROSTAT ) 0.4 MG SL tablet   Other Relevant Orders   Ambulatory referral to Cardiology     Respiratory   COLD (chronic obstructive lung disease) (HCC)   History of the same with 30 pack years worth of smoking.  Patient states she started smoking when she was approximately 53 years old and does a pack a day.  History of using inhalers in the past with good relief.  Patient does have rhonchi and wheezing today will start patient on Symbicort  and albuterol  as needed.  Ambulatory  referral to LDCT for lung cancer screening      Relevant Medications   albuterol  (VENTOLIN  HFA) 108 (90 Base) MCG/ACT inhaler   budesonide -formoterol  (SYMBICORT ) 160-4.5 MCG/ACT inhaler     Endocrine   Type 2 diabetes mellitus with complications (HCC)   History of the same does not check glucose at home.  Patient has been on metformin  in the past and did well.  A1c well-controlled at 6.0% today.  We will continue lifestyle modifications only at this juncture      Relevant Medications   atorvastatin  (LIPITOR ) 40 MG tablet   Other Relevant Orders   POCT glycosylated hemoglobin (Hb A1C) (Completed)   Microalbumin / creatinine urine ratio     Other   Tobacco abuse   History of the same pending urine microscopy rule out microscopic hematuria      Hyperlipidemia   History of the same in the setting of MI and PCI intervention Place patient back on atorvastatin  40 mg.  Pending lipid panel today      Relevant Medications   atorvastatin  (LIPITOR ) 40 MG tablet   nitroGLYCERIN  (NITROSTAT ) 0.4 MG SL tablet   Other Relevant Orders   Lipid panel   Ambulatory referral to Cardiology   Anxiety and depression   Likely secondary to caregiver stress.  Will start patient on sertraline  25 mg daily for 10 days then increase to 50 mg thereafter.  Patient denies HI/SI/AVH.  Patient has tried Lexapro  in the past that induced suicidal ideations.      Relevant Medications   sertraline  (ZOLOFT ) 50 MG tablet   Other Visit Diagnoses       Screening for bladder cancer       Relevant Orders   Urine Microscopic     Screening mammogram for breast cancer       Relevant Orders   MM 3D SCREENING MAMMOGRAM BILATERAL BREAST     Screening for colon cancer       Relevant Orders   Cologuard     Screening for lung cancer       Relevant Orders   Ambulatory Referral Lung Cancer Screening [facility]       Return in about 6 weeks (around 02/17/2024) for Chest pain/DOE/Mood.   Adina Crandall, NP

## 2024-01-06 NOTE — Assessment & Plan Note (Signed)
 History of the same does not check glucose at home.  Patient has been on metformin  in the past and did well.  A1c well-controlled at 6.0% today.  We will continue lifestyle modifications only at this juncture

## 2024-01-06 NOTE — Assessment & Plan Note (Signed)
 Patient having unstable angina with history of PCI intervention.  Nitroglycerin  as needed prescribed urgent referral to cardiology.  Also put patient back on high intensity statin of atorvastatin  40 mg daily with a history of ASCVD

## 2024-01-06 NOTE — Assessment & Plan Note (Signed)
 History of the same pending urine microscopy rule out microscopic hematuria

## 2024-01-06 NOTE — Assessment & Plan Note (Signed)
 History of the same in the setting of MI and PCI intervention Place patient back on atorvastatin  40 mg.  Pending lipid panel today

## 2024-01-06 NOTE — Patient Instructions (Addendum)
 Nice to see you today I will be in touch with the labs once I have them Follow up with me in 6 weeks, sooner if you need me  Call and schedule your mammogram at  [facility] [address] ( on hospital grounds) [place], [place]  [phone]  We did update your flu and pneumonia vaccine today

## 2024-01-06 NOTE — Assessment & Plan Note (Signed)
 History of the same patient having some unstable angina.  Will place patient back on atorvastatin  40 mg.  Nitroglycerin  as needed urgent referral to cardiology.  Emergency parameters reviewed with patient

## 2024-01-07 ENCOUNTER — Other Ambulatory Visit: Payer: Self-pay

## 2024-01-07 LAB — URINALYSIS, MICROSCOPIC ONLY
RBC / HPF: NONE SEEN (ref 0–?)
WBC, UA: NONE SEEN (ref 0–?)

## 2024-01-07 LAB — CBC
HCT: 27.4 % — ABNORMAL LOW (ref 36.0–46.0)
Hemoglobin: 9.4 g/dL — ABNORMAL LOW (ref 12.0–15.0)
MCHC: 34.4 g/dL (ref 30.0–36.0)
MCV: 85.8 fl (ref 78.0–100.0)
Platelets: 62 K/uL — ABNORMAL LOW (ref 150.0–400.0)
RBC: 3.2 Mil/uL — ABNORMAL LOW (ref 3.87–5.11)
RDW: 22.5 % — ABNORMAL HIGH (ref 11.5–15.5)
WBC: 6.7 K/uL (ref 4.0–10.5)

## 2024-01-07 LAB — COMPREHENSIVE METABOLIC PANEL WITH GFR
ALT: 7 U/L (ref 3–35)
AST: 8 U/L (ref 5–37)
Albumin: 4.2 g/dL (ref 3.5–5.2)
Alkaline Phosphatase: 57 U/L (ref 39–117)
BUN: 8 mg/dL (ref 6–23)
CO2: 27 meq/L (ref 19–32)
Calcium: 9 mg/dL (ref 8.4–10.5)
Chloride: 103 meq/L (ref 96–112)
Creatinine, Ser: 0.66 mg/dL (ref 0.40–1.20)
GFR: 101.07 mL/min (ref >60.00)
Glucose, Bld: 263 mg/dL — ABNORMAL HIGH (ref 70–99)
Potassium: 4 meq/L (ref 3.5–5.1)
Sodium: 137 meq/L (ref 135–145)
Total Bilirubin: 1 mg/dL (ref 0.2–1.2)
Total Protein: 6.8 g/dL (ref 6.0–8.3)

## 2024-01-07 LAB — TSH: TSH: 1.28 u[IU]/mL (ref 0.35–5.50)

## 2024-01-07 LAB — LIPID PANEL
Cholesterol: 121 mg/dL (ref 28–200)
HDL: 36.2 mg/dL — ABNORMAL LOW (ref >39.00)
LDL Cholesterol: 59 mg/dL (ref 10–99)
NonHDL: 85.2
Total CHOL/HDL Ratio: 3
Triglycerides: 131 mg/dL (ref 10.0–149.0)
VLDL: 26.2 mg/dL (ref 0.0–40.0)

## 2024-01-07 LAB — MICROALBUMIN / CREATININE URINE RATIO
Creatinine,U: 96.7 mg/dL
Microalb Creat Ratio: 32.1 mg/g — ABNORMAL HIGH (ref 0.0–30.0)
Microalb, Ur: 3.1 mg/dL — ABNORMAL HIGH (ref 0.7–1.9)

## 2024-01-12 ENCOUNTER — Ambulatory Visit: Payer: Self-pay | Admitting: Nurse Practitioner

## 2024-01-12 ENCOUNTER — Telehealth: Payer: Self-pay | Admitting: Nurse Practitioner

## 2024-01-12 DIAGNOSIS — R7989 Other specified abnormal findings of blood chemistry: Secondary | ICD-10-CM

## 2024-01-12 DIAGNOSIS — E1129 Type 2 diabetes mellitus with other diabetic kidney complication: Secondary | ICD-10-CM

## 2024-01-12 MED ORDER — LISINOPRIL 5 MG PO TABS
5.0000 mg | ORAL_TABLET | Freq: Every day | ORAL | 1 refills | Status: DC
  Filled 2024-01-13: qty 30, 30d supply, fill #0

## 2024-01-12 NOTE — Telephone Encounter (Signed)
 Not sure if I sent this to the pool with the resutls  Needs a non fasting 1 month lab visit to get her blood checked. If we could get her to come by ASAP and pick up a iFOB and I will add on some labs to check her iron levels and such.

## 2024-01-13 ENCOUNTER — Telehealth: Payer: Self-pay

## 2024-01-13 ENCOUNTER — Other Ambulatory Visit: Payer: Self-pay

## 2024-01-13 DIAGNOSIS — E1129 Type 2 diabetes mellitus with other diabetic kidney complication: Secondary | ICD-10-CM

## 2024-01-13 MED ORDER — LISINOPRIL 5 MG PO TABS: 5.0000 mg | ORAL_TABLET | Freq: Every day | ORAL | 1 refills | Status: AC

## 2024-01-13 NOTE — Telephone Encounter (Signed)
 Copied from CRM [id number]. Topic: Clinical - Prescription Issue >> Jan 13, 2024 11:07 AM Ashley R wrote: Reason for CRM: lisinopril  (ZESTRIL ) 5 MG tablet sent to wrong pharmacy. Needs to be sent to:   [facility] [address] [place] [address] Phone: [phone] Fax: [phone]   ----------------------------------------------------------------------- From previous Reason for Contact - Medication Refill: Medication:   Has the patient contacted their pharmacy?   (Agent: If no, request that the patient contact the pharmacy for the refill. If patient does not wish to contact the pharmacy document the reason why and proceed with request.) (Agent: If yes, when and what did the pharmacy advise?)  This is the patient's preferred pharmacy:    [facility] [address] [place] - [address] [place] [address] Phone: [phone] Fax: [phone]  Is this the correct pharmacy for this prescription?   If no, delete pharmacy and type the correct one.   Has the prescription been filled recently?    Is the patient out of the medication?    Has the patient been seen for an appointment in the last year OR does the patient have an upcoming appointment?    Can we respond through MyChart?    Agent: Please be advised that Rx refills may take up to 3 business days. We ask that you follow-up with your pharmacy.

## 2024-01-13 NOTE — Addendum Note (Signed)
 Addended by: WENDEE LYNWOOD HERO on: 01/13/2024 12:41 PM   Modules accepted: Orders

## 2024-01-13 NOTE — Telephone Encounter (Signed)
 Pt is scheduled for 1 month non fasting labs.  Pt picked up iFOB test a couple days ago, states she hasn't had any bowel movements to collect.  No further questions or concerns.

## 2024-01-15 ENCOUNTER — Ambulatory Visit: Admitting: Cardiovascular Disease

## 2024-01-15 ENCOUNTER — Other Ambulatory Visit: Payer: Self-pay

## 2024-01-15 ENCOUNTER — Encounter: Payer: Self-pay | Admitting: Cardiovascular Disease

## 2024-01-15 VITALS — BP 144/80 | HR 68 | Ht 64.0 in | Wt 131.6 lb

## 2024-01-15 DIAGNOSIS — I150 Renovascular hypertension: Secondary | ICD-10-CM | POA: Diagnosis present

## 2024-01-15 DIAGNOSIS — Z720 Tobacco use: Secondary | ICD-10-CM | POA: Insufficient documentation

## 2024-01-15 DIAGNOSIS — I25118 Atherosclerotic heart disease of native coronary artery with other forms of angina pectoris: Secondary | ICD-10-CM | POA: Diagnosis present

## 2024-01-15 DIAGNOSIS — E782 Mixed hyperlipidemia: Secondary | ICD-10-CM | POA: Diagnosis present

## 2024-01-15 MED ORDER — METOPROLOL TARTRATE 25 MG PO TABS
25.0000 mg | ORAL_TABLET | Freq: Two times a day (BID) | ORAL | 3 refills | Status: AC
  Filled 2024-01-15: qty 180, 90d supply, fill #0

## 2024-01-15 NOTE — Patient Instructions (Signed)
 Medication Instructions:  Your physician has recommended you make the following change in your medication:  START: metoprolol  tartrate (Lopressor ) 25 mg by mouth twice daily  *If you need a refill on your cardiac medications before your next appointment, please call your pharmacy*  Lab Work: NONE  If you have labs (blood work) drawn today and your tests are completely normal, you will receive your results only by: MyChart Message (if you have MyChart) OR A paper copy in the mail If you have any lab test that is abnormal or we need to change your treatment, we will call you to review the results.  Testing/Procedures: Your physician has requested that you have an echocardiogram. Echocardiography is a painless test that uses sound waves to create images of your heart. It provides your doctor with information about the size and shape of your heart and how well your hearts chambers and valves are working. This procedure takes approximately one hour. There are no restrictions for this procedure. Please do NOT wear cologne, perfume, aftershave, or lotions (deodorant is allowed). Please arrive 15 minutes prior to your appointment time.  Please note: We ask at that you not bring children with you during ultrasound (echo/ vascular) testing. Due to room size and safety concerns, children are not allowed in the ultrasound rooms during exams. Our front office staff cannot provide observation of children in our lobby area while testing is being conducted. An adult accompanying a patient to their appointment will only be allowed in the ultrasound room at the discretion of the ultrasound technician under special circumstances. We apologize for any inconvenience.   Follow-Up: At [facility], you and your health needs are our priority.  As part of our continuing mission to provide you with exceptional heart care, our providers are all part of one team.  This team includes your primary Cardiologist  (physician) and Advanced Practice Providers or APPs (Physician Assistants and Nurse Practitioners) who all work together to provide you with the care you need, when you need it.  Your next appointment:   6 month(s)  Provider:   Darryle ONEIDA Decent, MD      Other Instructions

## 2024-01-15 NOTE — Progress Notes (Signed)
 " Cardiology Office Note:  .   Date:  01/15/2024  ID:  [name], DOB [birth year], MRN [id number] PCP: Wendee Lynwood HERO, NP  [facility] Providers Cardiologist:  Darryle ONEIDA Decent, MD   History of Present Illness: .    Chief Complaint  Patient presents with   Chest Pain    Krista Alvarez is a 53 y.o. female with below history who presents for the evaluation of CAD at the request of Wendee Lynwood HERO, NP.   History of Present Illness   [name] is a 53 year old female with coronary artery disease who presents for evaluation of coronary artery disease. She is accompanied by her sister.  She experienced an episode of chest discomfort a couple of weeks ago while picking up her husband. The discomfort was described as a pressing sensation in the chest and back. She took nitroglycerin , which relieved the symptoms. She has had similar episodes in the past, which also resolved with nitroglycerin . No further episodes have occurred since then.  She has a history of a heart attack in 2018 and has undergone multiple cardiac catheterizations, the most recent being in 2021, which showed occlusion of the mid RCA with collateral circulation. She has been on nitroglycerin  for chest pain management and has previously taken metoprolol .  She reports being anemic with a hemoglobin level of 9.4. She is unsure of the cause and has not noticed any bleeding. She has not undergone a colonoscopy before but is undergoing Cologuard testing.  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 She occasionally drinks alcohol and does not use drugs.  She is on Lipitor  40 mg for hyperlipidemia, and her LDL is 59. She reports that she was told she was diabetic at one time, but was later told she was not. She takes lisinopril  5 mg daily for hypertension.  No recent episodes of chest pain since the  last episode resolved with nitroglycerin . No known bleeding or dark stools. Undergoing menopause.           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 HLD -T chol 121, HDL 36, LDL 59, TG 131 3. HTN 4. DM -A1c 6.0    ROS: All other ROS reviewed and negative. Pertinent positives noted in the HPI.     Studies Reviewed: SABRA   EKG Interpretation Date/Time:  Friday January 15 2024 14:05:18 EST Ventricular Rate:  68 PR Interval:  146 QRS Duration:  92 QT Interval:  388 QTC Calculation: 412 R Axis:   40  Text Interpretation: Normal sinus rhythm Normal ECG Confirmed by Decent Darryle [id number] on 01/15/2024 2:12:49 PM    TTE 03/02/2019  1. Left ventricular ejection fraction, by estimation, is 55 to 60%. The  left ventricle has normal function. The left ventrical demonstrates  regional wall motion abnormalities (see scoring diagram/findings for  description). Left ventricular diastolic  parameters were normal. There is mild hypokinesis of the left ventricular,  basal-mid inferior wall. The average left ventricular global longitudinal  strain is -20.8 %.   2. Right ventricular systolic function is normal. The right ventricular  size is normal. Tricuspid regurgitation signal is inadequate for assessing  PA pressure.   3. The mitral valve is degenerative. Mild mitral valve regurgitation.   4. The aortic valve was not well visualized. Aortic valve regurgitation  is not visualized.  No aortic stenosis is present.   5. The inferior vena cava is normal in size with <50% respiratory  variability, suggesting right atrial pressure of 8 mmHg.   LHC 03/03/2019 Dist LAD lesion is 30% stenosed. Prox LAD lesion is 30% stenosed. Mid Cx lesion is 70% stenosed. Mid Cx to Dist Cx lesion is 90% stenosed. RPDA lesion is 30% stenosed. Previously placed Mid RCA to Dist RCA drug eluting stent is widely patent. Balloon angioplasty was performed. Dist RCA lesion is 50%  stenosed. Prox RCA lesion is 40% stenosed. Prox RCA to Dist RCA lesion is 100% stenosed. Balloon angioplasty was performed. The left ventricular ejection fraction is 50-55% by visual estimate. LV end diastolic pressure is normal. The left ventricular systolic function is normal. There is no mitral valve regurgitation.   Physical Exam:   VS:  BP (!) 144/80 (BP Location: Left Arm, Patient Position: Sitting, Cuff Size: Normal)   Pulse 68   Ht 5' 4 (1.626 m)   Wt 131 lb 9.6 oz (59.7 kg)   LMP 09/08/2017   SpO2 100%   BMI 22.59 kg/m    Wt Readings from Last 3 Encounters:  01/15/24 131 lb 9.6 oz (59.7 kg)  01/06/24 135 lb 3.2 oz (61.3 kg)  03/31/19 157 lb 8 oz (71.4 kg)    GEN: Well nourished, well developed in no acute distress NECK: No JVD; No carotid bruits CARDIAC: RRR, no murmurs, rubs, gallops RESPIRATORY:  Clear to auscultation without rales, wheezing or rhonchi  ABDOMEN: Soft, non-tender, non-distended EXTREMITIES:  No edema; No deformity  ASSESSMENT AND PLAN: .   Assessment and Plan    Coronary artery disease with stable angina pectoris Intermittent chest pain resolved with nitroglycerin . Previous inferior STEMI in 2018 with catheterization in 2021 showing occlusion of mid RCA with collaterals, 90% circumflex lesion with collaterals, and 70% distal circumflex with collateral flow. Current management focuses on medical therapy due to anemia and potential bleeding risk. - Started metoprolol  tartrate 25 mg BID for chest pain management. - Refilled nitroglycerin  for emergency use. - Ordered heart ultrasound to assess heart function. - Deferred stress test until anemia is resolved.  Anemia, evaluation for gastrointestinal bleeding Anemia with hemoglobin at 9.4. Potential gastrointestinal bleeding suspected, possibly from a colon polyp or other source. - Hold aspirin  until anemia is resolved. - Recommended colonoscopy for evaluation of potential gastrointestinal bleeding. -  Follow up on Cologuard results.  Mixed hyperlipidemia LDL at 59 on current Lipitor  40 mg therapy. Lipid levels are well-controlled. - Continue Lipitor  40 mg daily.  Hypertension Blood pressure slightly elevated. Metoprolol  prescribed primarily for chest pain management, not hypertension. - Continue lisinopril  5 mg daily.  Tobacco use disorder Current smoker, approximately half a pack per day. Smoking cessation strongly advised due to cardiovascular and potential pulmonary risks. - Advised smoking cessation.                Follow-up: Return in about 6 months (around 07/15/2024).  Signed, Darryle DASEN. Barbaraann, MD, [facility]  [facility]  [address] [place], [place] [address] [phone]  2:42 PM   "

## 2024-02-03 ENCOUNTER — Telehealth: Payer: Self-pay

## 2024-02-03 DIAGNOSIS — Z87891 Personal history of nicotine dependence: Secondary | ICD-10-CM

## 2024-02-03 DIAGNOSIS — F17210 Nicotine dependence, cigarettes, uncomplicated: Secondary | ICD-10-CM

## 2024-02-03 DIAGNOSIS — Z122 Encounter for screening for malignant neoplasm of respiratory organs: Secondary | ICD-10-CM

## 2024-02-03 NOTE — Telephone Encounter (Signed)
 Lung Cancer Screening Narrative/Criteria Questionnaire (Cigarette Smokers Only- No Cigars/Pipes/vapes)   Krista Alvarez   SDMV:02/11/2024 at 8:30 am [name]        May 01, 1970               LDCT: 02/17/2024 at 1:00 pm GI    54 y.o.   Phone: [phone]  Lung Screening Narrative (confirm age 67-77 yrs Medicare / 50-80 yrs Private pay insurance)   Insurance information: Medicaid   Referring Provider: Wendee, NP   This screening involves an initial phone call with a team member from our program. It is called a shared decision making visit. The initial meeting is required by  insurance and Medicare to make sure you understand the program. This appointment takes about 15-20 minutes to complete. You will complete the screening scan at your scheduled date/time.  This scan takes about 5-10 minutes to complete. You can eat and drink normally before and after the scan.  Criteria questions for Lung Cancer Screening:   Are you a current or former smoker? Current Age began smoking: 14   If you are a former smoker, what year did you quit smoking? {Never quit over one year (within 15 yrs)   To calculate your smoking history, I need an accurate estimate of how many packs of cigarettes you smoked per day and for how many years. (Not just the number of PPD you are now smoking)   Years smoking 39 x Packs per day 2 = Pack years 39   (at least 20 pack yrs)   (Make sure they understand that we need to know how much they have smoked in the past, not just the number of PPD they are smoking now)  Do you have a personal history of cancer?  No    Do you have a family history of cancer? Yes  (cancer type and and relative) Yes mother had lung and throat cancer. Father had throat cancer.   Are you coughing up blood?  No  Have you had unexplained weight loss of 15 lbs or more in the last 6 months? No  It looks like you meet all criteria.  When would be a good time for us  to schedule you for this  screening?   Additional information: N/A

## 2024-02-11 ENCOUNTER — Encounter: Payer: Self-pay | Admitting: *Deleted

## 2024-02-11 ENCOUNTER — Ambulatory Visit: Admitting: *Deleted

## 2024-02-11 DIAGNOSIS — F17210 Nicotine dependence, cigarettes, uncomplicated: Secondary | ICD-10-CM | POA: Diagnosis not present

## 2024-02-11 NOTE — Progress Notes (Signed)
" °  Virtual Visit via Telephone Note  I connected with Krista Alvarez on 02/11/24 at  8:30 AM EST by telephone and verified that I am speaking with the correct person using two identifiers.  Location: Patient: at home Provider: [address], [place], [place], [address]    I discussed the limitations, risks, security and privacy concerns of performing an evaluation and management service by telephone and the availability of in person appointments. I also discussed with the patient that there may be a patient responsible charge related to this service. The patient expressed understanding and agreed to proceed.    Shared Decision Making Visit Lung Cancer Screening Program [phone])   Eligibility: Age 4 y.o. Pack Years Smoking History Calculation 91 (# packs/per year x # years smoked) Recent History of coughing up blood  no Unexplained weight loss? no ( >Than 15 pounds within the last 6 months ) Prior History Lung / other cancer no (Diagnosis within the last 5 years already requiring surveillance chest CT Scans). Smoking Status Current Smoker Former Smokers: Years since quit: n/a  Quit Date: n/a  Visit Components: Discussion included one or more decision making aids. yes Discussion included risk/benefits of screening. yes Discussion included potential follow up diagnostic testing for abnormal scans. yes Discussion included meaning and risk of over diagnosis. yes Discussion included meaning and risk of False Positives. yes Discussion included meaning of total radiation exposure. yes  Counseling Included: Importance of adherence to annual lung cancer LDCT screening. yes Impact of comorbidities on ability to participate in the program. yes Ability and willingness to under diagnostic treatment. yes  Smoking Cessation Counseling: Current Smokers:  Discussed importance of smoking cessation. yes Information about tobacco cessation classes and interventions provided to patient.  yes Patient provided with ticket for LDCT Scan. no Symptomatic Patient. no  Counseling(Intermediate counseling: > three minutes) [address] Diagnosis Code: Tobacco Use Z72.0 Asymptomatic Patient yes Smoking/Tobacco Cessation Counseling Krista Alvarez is a current user of tobacco or nicotine  products. She is not ready to quit at this time. Counseling provided today addressed the risks of continued use and the benefits of cessation. Discussed tobacco/nicotine  use history, readiness to quit, and evidence-based treatment options including behavioral strategies, support resources, and pharmacologic therapies. Provided encouragement and educational materials on steps and resources to quit smoking. Patient questions were addressed, and follow-up recommended for continued support. Total time spent on counseling: 4 minutes.    Former Smokers:  Discussed the importance of maintaining cigarette abstinence. yes Diagnosis Code: Personal History of Nicotine  Dependence. S12.108 Information about tobacco cessation classes and interventions provided to patient. Yes Patient provided with ticket for LDCT Scan. no Written Order for Lung Cancer Screening with LDCT placed in Epic. Yes (CT Chest Lung Cancer Screening Low Dose W/O CM) [id number] Z12.2-Screening of respiratory organs Z87.891-Personal history of nicotine  dependence   Krista Speaks, RN       "

## 2024-02-11 NOTE — Patient Instructions (Signed)
 Thank you for participating in the [facility] Cancer Screening Program. It was our pleasure to meet you today. We will call you with the results of your scan within the next few days. Your scan will be assigned a Lung RADS category score by the physicians reading the scans.  This Lung RADS score determines follow up scanning.  See below for description of categories, and follow up screening recommendations. We will be in touch to schedule your follow up screening annually or based on recommendations of our providers. We will fax a copy of your scan results to your Primary Care Physician, or the physician who referred you to the program, to ensure they have the results. Please call the office if you have any questions or concerns regarding your scanning experience or results.  Our office number is [phone]. Please speak with Karna Curly, RN., Karna Doom RN, or [facility] RN, and Isaiah Dover RN. They are  our Lung Cancer Screening RN.'s If They are unavailable when you call, Please leave a message on the voice mail. We will return your call at our earliest convenience.This voice mail is monitored several times a day.  Remember, if your scan is normal, we will scan you annually as long as you continue to meet the criteria for the program. (Age 53-80, Current smoker or smoker who has quit within the last 15 years). If you are a smoker, remember, quitting is the single most powerful action that you can take to decrease your risk of lung cancer and other pulmonary, breathing related problems. We know quitting is hard, and we are here to help.  Please let us  know if there is anything we can do to help you meet your goal of quitting. If you are a former smoker, counselling psychologist. We are proud of you! Remain smoke free! Remember you can refer friends or family members through the number above.  We will screen them to make sure they meet criteria for the program. Thank you for helping us   take better care of you by participating in Lung Screening.  For Virtual Smoking Cessation Classes , The American Lung Association Provides  Freedom From Smoking Classes.  Please search their website for dates and times.    Lung RADS Categories:  Lung RADS 1: no nodules or definitely non-concerning nodules.  Recommendation is for a repeat annual scan in 12 months.  Lung RADS 2:  nodules that are non-concerning in appearance and behavior with a very low likelihood of becoming an active cancer. Recommendation is for a repeat annual scan in 12 months.  Lung RADS 3: nodules that are probably non-concerning , includes nodules with a low likelihood of becoming an active cancer.  Recommendation is for a 78-month repeat screening scan. Often noted after an upper respiratory illness. We will be in touch to make sure you have no questions, and to schedule your 53-month scan.  Lung RADS 4 A: nodules with concerning findings, recommendation is most often for a follow up scan in 3 months or additional testing based on our provider's assessment of the scan. We will be in touch to make sure you have no questions and to schedule the recommended 3 month follow up scan.  Lung RADS 4 B:  indicates findings that are concerning. We will be in touch with you to schedule additional diagnostic testing based on our provider's  assessment of the scan.  You can receive free nicotine  replacement therapy ( patches, gum or mints) by calling 1-800-QUIT NOW.  Please call so we can get you on the path to becoming  a non-smoker. I know it is hard, but you can do this!  Other options for assistance in smoking cessation ( As covered by your insurance benefits)  Hypnosis for smoking cessation  [employer] [phone]  Acupuncture for smoking cessation  [employer] [phone]

## 2024-02-15 ENCOUNTER — Other Ambulatory Visit

## 2024-02-16 ENCOUNTER — Ambulatory Visit (HOSPITAL_COMMUNITY)

## 2024-02-17 ENCOUNTER — Inpatient Hospital Stay: Admission: RE | Admit: 2024-02-17 | Source: Ambulatory Visit

## 2024-02-17 ENCOUNTER — Telehealth: Payer: Self-pay

## 2024-02-17 NOTE — Telephone Encounter (Signed)
 Unable to LVM, phone continued to ring. LDCT for this afternoon has been cancelled. Reminder set to call patient back at [facility] later date and reschedule appt.   Copied from CRM #[id number]. Topic: Appointments - Scheduling Inquiry for Clinic >> Feb 17, 2024 11:17 AM Krista Alvarez wrote: Reason for CRM: patient calling in stating she needs to reschedule Lung Cancer Screening CT that was scheduled for today 02/17/24 at 1:00pm , patient states she is sick and cannot go in

## 2024-02-19 ENCOUNTER — Other Ambulatory Visit

## 2024-02-25 ENCOUNTER — Inpatient Hospital Stay: Admission: RE | Admit: 2024-02-25 | Source: Ambulatory Visit

## 2024-02-26 ENCOUNTER — Other Ambulatory Visit

## 2024-02-29 ENCOUNTER — Ambulatory Visit: Admitting: Nurse Practitioner

## 2024-03-24 ENCOUNTER — Ambulatory Visit (HOSPITAL_COMMUNITY)
